# Patient Record
Sex: Female | Born: 1940
Health system: Southern US, Community
[De-identification: ages and names within clinical notes are randomized; demographics above are authoritative.]

## PROBLEM LIST (undated history)

## (undated) DIAGNOSIS — M549 Dorsalgia, unspecified: Secondary | ICD-10-CM

## (undated) DIAGNOSIS — D649 Anemia, unspecified: Secondary | ICD-10-CM

## (undated) DIAGNOSIS — I82409 Acute embolism and thrombosis of unspecified deep veins of unspecified lower extremity: Secondary | ICD-10-CM

## (undated) DIAGNOSIS — R51 Headache: Secondary | ICD-10-CM

## (undated) DIAGNOSIS — Z8711 Personal history of peptic ulcer disease: Secondary | ICD-10-CM

## (undated) DIAGNOSIS — Z8739 Personal history of other diseases of the musculoskeletal system and connective tissue: Secondary | ICD-10-CM

## (undated) DIAGNOSIS — M1990 Unspecified osteoarthritis, unspecified site: Secondary | ICD-10-CM

## (undated) DIAGNOSIS — K829 Disease of gallbladder, unspecified: Secondary | ICD-10-CM

## (undated) DIAGNOSIS — M25569 Pain in unspecified knee: Secondary | ICD-10-CM

## (undated) DIAGNOSIS — Z86718 Personal history of other venous thrombosis and embolism: Secondary | ICD-10-CM

## (undated) DIAGNOSIS — K5900 Constipation, unspecified: Secondary | ICD-10-CM

## (undated) DIAGNOSIS — E559 Vitamin D deficiency, unspecified: Secondary | ICD-10-CM

## (undated) DIAGNOSIS — M7989 Other specified soft tissue disorders: Secondary | ICD-10-CM

## (undated) HISTORY — DX: Personal history of peptic ulcer disease: Z87.11

## (undated) HISTORY — DX: Disease of gallbladder, unspecified: K82.9

## (undated) HISTORY — DX: Other specified soft tissue disorders: M79.89

## (undated) HISTORY — DX: Constipation, unspecified: K59.00

## (undated) HISTORY — DX: Personal history of other venous thrombosis and embolism: Z86.718

## (undated) HISTORY — DX: Vitamin D deficiency, unspecified: E55.9

## (undated) HISTORY — DX: Pain in unspecified knee: M25.569

## (undated) HISTORY — DX: Dorsalgia, unspecified: M54.9

## (undated) HISTORY — DX: Anemia, unspecified: D64.9

## (undated) HISTORY — DX: Unspecified osteoarthritis, unspecified site: M19.90

## (undated) HISTORY — DX: Personal history of other diseases of the musculoskeletal system and connective tissue: Z87.39

## (undated) HISTORY — DX: Acute embolism and thrombosis of unspecified deep veins of unspecified lower extremity: I82.409

---

## 1999-05-14 HISTORY — PX: BUNIONECTOMY: SHX129

## 1999-05-14 HISTORY — PX: HAMMER TOE SURGERY: SHX385

## 2003-11-15 ENCOUNTER — Emergency Department (HOSPITAL_COMMUNITY): Admission: EM | Admit: 2003-11-15 | Discharge: 2003-11-15 | Payer: Self-pay | Admitting: Emergency Medicine

## 2004-05-13 HISTORY — PX: OTHER SURGICAL HISTORY: SHX169

## 2005-02-27 ENCOUNTER — Encounter: Admission: RE | Admit: 2005-02-27 | Discharge: 2005-02-27 | Payer: Self-pay | Admitting: Specialist

## 2005-05-13 HISTORY — PX: CHOLECYSTECTOMY: SHX55

## 2005-05-13 HISTORY — PX: UMBILICAL HERNIA REPAIR: SHX196

## 2005-10-21 ENCOUNTER — Inpatient Hospital Stay (HOSPITAL_COMMUNITY): Admission: EM | Admit: 2005-10-21 | Discharge: 2005-10-23 | Payer: Self-pay | Admitting: Emergency Medicine

## 2005-10-21 ENCOUNTER — Emergency Department (HOSPITAL_COMMUNITY): Admission: EM | Admit: 2005-10-21 | Discharge: 2005-10-21 | Payer: Self-pay | Admitting: Emergency Medicine

## 2005-10-22 ENCOUNTER — Encounter (INDEPENDENT_AMBULATORY_CARE_PROVIDER_SITE_OTHER): Payer: Self-pay | Admitting: Specialist

## 2008-08-18 ENCOUNTER — Other Ambulatory Visit: Admission: RE | Admit: 2008-08-18 | Discharge: 2008-08-18 | Payer: Self-pay | Admitting: Family Medicine

## 2009-05-13 HISTORY — PX: EYE SURGERY: SHX253

## 2010-09-28 NOTE — Op Note (Signed)
[name], [name]             ACCOUNT NO.:  [id number]   MEDICAL RECORD NO.:  [id number]          PATIENT TYPE:  INP   LOCATION:  5739                         FACILITY:  [facility]   PHYSICIAN:  Anselm Pancoast. Weatherly, M.D.DATE OF BIRTH:  12-11-1940   DATE OF PROCEDURE:  10/22/2005  DATE OF DISCHARGE:                                 OPERATIVE REPORT   PREOPERATIVE DIAGNOSIS:  Lisa cholecystitis with passage of common duct  Owens.   POSTOPERATIVE DIAGNOSIS:  Lisa Owens.   OPERATION:  1.  Laparoscopic cholecystectomy with cholangiogram, with flushing of Owens      through the ampulla and one stone retrieval through the cystic duct.  2.  Repair of a small umbilical hernia.   HISTORY:  Lisa Owens is a 70 year old lady who has lived in [place] for  about 30 years, is followed by Dr. Theresia Lo, and I saw her in the office in  March 2007 when she had had episodes of repeat epigastric pain, had had  episodes of normal liver function studies.  These were always epigastric,  would usually last about an hour, and I reviewed her and thought that she  was definitely passing common duct Owens.  She had had a previous tummy  tuck by Dr. Dub Amis years ago, and she has lost about 100 pounds, and she  desired to basically postpone surgery because of social schedules that she  had an tentatively was all scheduled for surgery sometime in July.  Yesterday she had an episode of pain that lasted about 3 hours, was more  intense, and she went to the emergency room.  She was seen there by Dr.  Carolynne Edouard, who was on call, and because of the patient having previously been  scheduled and it was midnightish when he was evaluating her, recommended  that she come on over to [facility] and let me add her to the OR schedule for  today.  I saw her this morning and she said her pain, after the pain had  subsided after approximately 3 hours, had not recurred.  Her white count was  12,500.  The liver function studies were abnormal again with the total  bilirubin of 1.8, alkaline phosphatase of 204, SGOT and SGPT of about 400  and 500, respectively.  I think it would be best just go ahead and add her  to the OR schedule and proceed with a cholangiogram and whether she will  have common duct Owens remaining or not will be determined.  The patient is  in agreement with this, was given 3 g of Unasyn and has PAS stockings.   She was positioned on the OR table and induction of general anesthesia,  abdomen was prepped with Betadine surgical scrub and solution and draped in  a sterile manner.  She has a little hernia at the umbilicus where she has  had the previous tummy tuck, and I made the little incision just below this  so I could go down and identify the fascia and then carefully entered into  the peritoneum.  Basically a single traction suture was placed in  the fascia  with 0 Vicryl and the Hasson cannula introduced.  The gallbladder was  chronically distended but not acutely inflamed, and there were adhesions up  around it.  The upper 10 mm trocar was placed and the two lateral 5 mm  trocars were placed under direct vision.  Magnus Ivan assisted and we  grabbed the gallbladder, retracted it upward and outward and then kind of  stripped the Lisa adhesions carefully from it and then identified the  cystic duct and also the cystic artery.  The Owens were in the cystic duct.  These were milked back into the gallbladder, and then I placed a clip on the  cystic duct-gallbladder junction, opened the cystic duct.  Another stone  popped up from the more proximal cystic duct, I think, and then we kind of  milked it.  We got a lot of sludge and kind of soft stone type debris.  Next  I put a Agricultural consultant in the cystic duct, held it in place with a  clip, and then did a very careful cholangiogram with the diluted dye, and we  could see three Owens in the distal  common bile duct but with gentle  pressure, these popped on through the cystic duct.  She still has a  chronically dilated cystic duct but we let the dye to get flushed through  with the saline, gave her some glucagon, and then repeated the x-ray, and  this time besides being a dilated cystic duct, we see no other Owens in the  cystic duct.  I think that what I would do is not recommend that she proceed  on with an ERCP now but see how she is doing clinically symptomatically and  make sure her liver tests come back to normal, as this is the 10th or 15th  episode of pain that she has had over the last three years.  The cystic duct  was triply clipped and then divided.  The cystic artery had been dissected  out and doubly clipped proximally, singly distally, and it was divided, and  then the gallbladder was freed from its bed with hook electrocautery  carefully.  Good hemostasis was obtained.  The gallbladder was placed in the  EndoCatch bag and brought out through the umbilicus.  I then basically freed  up a little fatty tissue that was up under the umbilicus and closed the  fascia with interrupted sutures of 0 Prolene.  I used an Endoclose to go up  high and, hopefully, the little skin bridge at the umbilicus is still  viable, and then reinflated the carbon dioxide, and it appears that  everything as fascia is intact.  The little irrigating fluid had been  aspirated.  It appeared there had been a little bit of bleeding from where  the cholangiocatheter had been put through the anterior abdominal wall, and  this was irrigated.  I did inject it with Marcaine, and there is no active  bleeding at this time.  The irrigating fluid was removed, 5 mm ports were  withdrawn under direct vision and then, after the carbon dioxide released,  the upper 10 mm trocar withdrawn.  The subcutaneous wounds were closed with  4-0 Vicryl and Benzoin and Steri-Strips were placed on the skin.           ______________________________  Anselm Pancoast. Zachery Dakins, M.D.     WJW/MEDQ  D:  10/22/2005  T:  10/22/2005  Job:  [id number]   cc:  Vikki Ports, M.D.  Fax: [phone]

## 2010-09-28 NOTE — Consult Note (Signed)
Lisa Owens, Lisa Owens                         ACCOUNT NO.:  [id number]   MEDICAL RECORD NO.:  [id number]                   PATIENT TYPE:  EMS   LOCATION:  MAJO                                 FACILITY:  [facility]   PHYSICIAN:  Pramod P. Pearlean Brownie, MD                 DATE OF BIRTH:  08/25/1940   DATE OF CONSULTATION:  DATE OF DISCHARGE:                                   CONSULTATION   REFERRING PHYSICIAN:  Carren Rang, M.D.   REASON FOR REFERRAL:  Memory loss.   HISTORY OF PRESENT ILLNESS:  Lisa Owens is 70 year old lady who developed a  sudden onset of short term memory loss earlier today.  The patient just  finished testifying in court in favor of her son who is being sued for  stalking by her employee.  The patient apparently also had a flawless  testimony as per the husband, but subsequently she was found to be confused  and not able to remember any recent events including events from the last  few months.  The patient, in fact, did not even remember being in court when  she was evaluated in the emergency room upon her arrival.  Since then, she  has started improving and is now able to recall recent events including the  fact that she was in court, though she is still unable to recall details of  the questions she was asked.  She denies any headache, confusion, slurred  speech, gait or balance difficulties.  No prior history of stroke, TIAs,  significant neurological problems.   PAST MEDICAL HISTORY:  1. Hyperlipidemia.  2. History of shingles 2-3 weeks ago.   HOME MEDICATIONS:  None.   MEDICATION ALLERGIES:  None.   FAMILY HISTORY:  Not significant for anybody with neurological problems.   SOCIAL HISTORY:  She is married.  She is a housewife.  She lives with her  husband.  Does not smoke, and drinks alcohol only occasionally.   REVIEW OF SYSTEMS:  Not significant for chest pain, shortness of breath,  cough, diarrhea, __________ , blurred vision.   PHYSICAL EXAMINATION:   GENERAL:  Reveals a pleasant elderly lady who is not  in distress.  VITAL SIGNS:  She is afebrile.  Pulse rate is 85 per minute regular.  Respiratory rate 18 per minute.  Blood pressure 139/75.  Distal pulses well  felt.  HEAD:  Nontraumatic.  NECK:  Supple without bruit.  ENT:  Unremarkable.  CARDIAC:  No murmur, gallop.  LUNGS:  Clear to auscultation.  NEUROLOGIC:  The patient is awake, alert, oriented x 3 with normal speech  and language function.  There is no aphasia, apraxia, or dysarthria.  She  has diminished recall 0 out of 3.  She has intact attention, registration,  calculation, naming, comprehension, repetition, and good verbal fluency.  She can follow three-step commands.  Pupils are equal, reactive to light and  accommodation.  Visual acuity and fields are adequate.  Face is symmetric.  Palate elevates normally.  The tongue is midline.  Motor system exam reveals  symmetric upper and lower extremity strength, tone, reflexes, coordination,  and sensation.  She can walk with a steady gait.   DATA REVIEWED:  MRI scan of the brain has been arranged but the patient  refused it saying she can not go through the procedure.  The patient was  advised to take 2 mg of Ativan and go back in the MRI scan which she refused  that too.   IMPRESSION:  A 70 year old lady with an episode of transient new and short  term memory loss which seems to be improving.  This does sound significantly  like transient global amnesia.  Even the significant stress from the recent  court appearance may have contributed or even brought it on.  The patient is  refusing further testing and since she has significantly improved, she may  perhaps be discharged home and can be followed up as an outpatient in the  future if necessary.  I would recommend she start aspirin 325 mg a day for  stroke and cardiac prevention.  If her symptoms recur or she develops new  symptoms, she has promised to come back to the  emergency room.  I have  discussed this with the patient's husband and answered questions.                                               Pramod P. Pearlean Brownie, MD    PPS/MEDQ  D:  11/15/2003  T:  11/16/2003  Job:  [id number]

## 2010-09-28 NOTE — Discharge Summary (Signed)
[name], [name]             ACCOUNT NO.:  [id number]   MEDICAL RECORD NO.:  [id number]          PATIENT TYPE:  INP   LOCATION:  5739                         FACILITY:  [facility]   PHYSICIAN:  Anselm Pancoast. Zachery Dakins, M.D.DATE OF BIRTH:  November 16, 1940   DATE OF ADMISSION:  10/21/2005  DATE OF DISCHARGE:  10/23/2005                                 DISCHARGE SUMMARY   DISCHARGE DIAGNOSIS:  Chronic cholecystitis with recent passage of common  duct stones.   OPERATION:  Laparoscopic cholecystectomy with cholangiogram.   HISTORY:  Mrs. Lisa Owens is a 70 year old female who had been scheduled by my  office for elective cholecystectomy and cholangiogram.  I saw her back in  March, but she wanted to wait until the summer.  She has had episodes of  epigastric pain and, on October 21, 2005, she had a severe episode of pain and  presented to the emergency room.  She was seen by Dr. Carolynne Edouard, who was on call.  He evaluated her and recommended that she be admitted to [facility], where I was  the doctor of the week, so I could proceed with the cholecystectomy  promptly, instead of waiting for the time she was tentatively scheduled in  July.  The patient was in agreement with this and she was transferred over  that evening.  I saw her the following morning.  Her white count was 12,500.  Her liver function studies were initially elevated.  SGOT of 505, SGPT of  453, alkaline phosphatase 204, and a total bilirubin of 1.8.  Repeated the  studies and they were slightly better, this was 12 hours later, and we  proceeded onto surgery and did a cholangiogram.  The cholangiogram was  consistent with a kind-of dilated common biliary system, but flowing to the  duodenum and I think she passed a common duct stone.  The patient,  postoperatively, did satisfactorily and was released the following day with  instructions to return to our office in approximately 1 week and we would  repeat the laboratory tests at that time.   Patient has Tylox for incision pain.  Will follow a low-fat diet.  She is on  a weight reduction diet and we will continue on her chronic medications.           ______________________________  Anselm Pancoast. Zachery Dakins, M.D.     WJW/MEDQ  D:  12/21/2005  T:  12/21/2005  Job:  [id number]

## 2010-09-28 NOTE — H&P (Signed)
NAMEKALIA, VAHEY NO.:  [id number]   MEDICAL RECORD NO.:  [id number]          PATIENT TYPE:  EMS   LOCATION:  ED                           FACILITY:  [facility]   PHYSICIAN:  Ollen Gross. Vernell Morgans, M.D. DATE OF BIRTH:  09-20-1940   DATE OF ADMISSION:  10/21/2005  DATE OF DISCHARGE:                                HISTORY & PHYSICAL   Mr. Gustafson is a 70 year old white female who is a patient of Dr. Zachery Dakins.  She has seen him in the past for gallstones. He apparently has worked her up  and had her set up for cholecystectomy in July. Unfortunately, her pain  which she has been having pretty much every day has worsened to the point  where the pain is unbearable right now. She has not really had any nausea or  vomiting with this. No fever or chills. No chest pain or shortness of  breath. No diarrhea or dysuria. Her other review of systems are  unremarkable.   PAST MEDICAL HISTORY:  None.   PAST SURGICAL HISTORY:  Significant for a tummy tuck and bunion surgery.   MEDICATIONS:  None.   ALLERGIES:  None.   SOCIAL HISTORY:  She denies the use of alcohol or tobacco products.   FAMILY HISTORY:  Noncontributory.   PHYSICAL EXAMINATION:  GENERAL: She is a well-developed, well-nourished  white female in significant pain.  SKIN: Warm and dry. No jaundice.  HEENT:  Extraocular muscles are intact. Pupils equal, round, and reactive to  light. Sclerae are nonicteric.  LUNGS: Clear bilaterally with no use of accessory or respiratory muscles.  HEART: Regular rate and rhythm with an impulse in the left chest.  ABDOMEN: Soft, with severe epigastric right upper quadrant pain. No guarding  or peritonitis.  EXTREMITIES: No clubbing, cyanosis, or edema, with good strength in her arms  and legs.  PSYCHOLOGIC:  She is alert and oriented times three, with no known state of  anxiety or depression.   ASSESSMENT/PLAN:  This is a 70 year old white female with what sounds like  worsening  of her gallbladder pain. Dr. Zachery Dakins is the physician of the  week this week and since we have no beds at [facility] we will plan to  directly admit her to the physician of week, Dr. Zachery Dakins, over at [facility]. We  will work on her pain control tonight, check her liver function tests, CBC,  ultrasound, and set her up hopefully for gallbladder surgery in the next day  or so.      Ollen Gross. Vernell Morgans, M.D.  Electronically Signed     PST/MEDQ  D:  10/21/2005  T:  10/21/2005  Job:  [id number]

## 2011-05-17 ENCOUNTER — Other Ambulatory Visit: Payer: Self-pay | Admitting: Specialist

## 2011-05-17 DIAGNOSIS — M25511 Pain in right shoulder: Secondary | ICD-10-CM

## 2011-05-23 ENCOUNTER — Ambulatory Visit
Admission: RE | Admit: 2011-05-23 | Discharge: 2011-05-23 | Disposition: A | Discharge status: Home / Self Care | Payer: BC Managed Care – PPO | Source: Ambulatory Visit | Attending: Specialist | Admitting: Specialist

## 2011-05-23 DIAGNOSIS — M25511 Pain in right shoulder: Secondary | ICD-10-CM

## 2011-06-21 ENCOUNTER — Other Ambulatory Visit: Payer: Self-pay | Admitting: Specialist

## 2011-06-21 DIAGNOSIS — M25511 Pain in right shoulder: Secondary | ICD-10-CM

## 2011-07-01 ENCOUNTER — Ambulatory Visit
Admission: RE | Admit: 2011-07-01 | Discharge: 2011-07-01 | Disposition: A | Discharge status: Home / Self Care | Payer: Medicare Other | Source: Ambulatory Visit | Attending: Specialist | Admitting: Specialist

## 2011-07-01 DIAGNOSIS — M25511 Pain in right shoulder: Secondary | ICD-10-CM

## 2011-07-26 ENCOUNTER — Other Ambulatory Visit: Payer: Self-pay | Admitting: Otolaryngology

## 2011-08-28 ENCOUNTER — Encounter (HOSPITAL_COMMUNITY): Payer: Self-pay | Admitting: Pharmacy Technician

## 2011-09-16 ENCOUNTER — Encounter (HOSPITAL_COMMUNITY)
Admission: RE | Admit: 2011-09-16 | Discharge: 2011-09-16 | Disposition: A | Discharge status: Home / Self Care | Payer: Medicare Other | Source: Ambulatory Visit | Attending: Specialist | Admitting: Specialist

## 2011-09-16 ENCOUNTER — Encounter (HOSPITAL_COMMUNITY): Payer: Self-pay

## 2011-09-16 HISTORY — DX: Headache: R51

## 2011-09-16 LAB — CBC
HCT: 40.1 % (ref 36.0–46.0)
Hemoglobin: 13.7 g/dL (ref 12.0–15.0)
MCH: 30.4 pg (ref 26.0–34.0)
MCHC: 34.2 g/dL (ref 30.0–36.0)
MCV: 89.1 fL (ref 78.0–100.0)
Platelets: 240 10*3/uL (ref 150–400)
RBC: 4.5 MIL/uL (ref 3.87–5.11)
RDW: 13.3 % (ref 11.5–15.5)
WBC: 7.5 10*3/uL (ref 4.0–10.5)

## 2011-09-16 LAB — SURGICAL PCR SCREEN
MRSA, PCR: NEGATIVE
Staphylococcus aureus: POSITIVE — AB

## 2011-09-16 NOTE — Patient Instructions (Signed)
20 Lisa Owens  09/16/2011   Your procedure is scheduled on:  Thursday 09/19/2011 at 0730am  Report to [facility] at 0530 AM.  Call this number if you have problems the morning of surgery: [phone]   Remember:   Do not eat food:After Midnight.  May have clear liquids:until Midnight .  Marland Kitchen  Take these medicines the morning of surgery with A SIP OF WATER: NONE   Do not wear jewelry, make-up or nail polish.  Do not wear lotions, powders, or perfumes.   Do not shave 48 hours prior to surgery-women only -shaving legs  Do not bring valuables to the hospital.  Contacts, dentures or bridgework may not be worn into surgery.  Leave suitcase in the car. After surgery it may be brought to your room.  For patients admitted to the hospital, checkout time is 11:00 AM the day of discharge.       Special Instructions: CHG Shower Use Special Wash: 1/2 bottle night before surgery and 1/2 bottle morning of surgery.   Please read over the following fact sheets that you were given: MRSA Information, Incentive Spirometry, Sleep apnea sheet

## 2011-09-16 NOTE — Pre-Procedure Instructions (Signed)
Chest PA/LAT 07/15/2011 from [place] AT [facility], EKG 07/15/2011 Deboraha Sprang at [facility] on chart

## 2011-09-18 NOTE — Anesthesia Preprocedure Evaluation (Signed)

## 2011-09-19 ENCOUNTER — Encounter (HOSPITAL_COMMUNITY): Payer: Self-pay | Admitting: Anesthesiology

## 2011-09-19 ENCOUNTER — Ambulatory Visit (HOSPITAL_COMMUNITY): Payer: Medicare Other | Admitting: Anesthesiology

## 2011-09-19 ENCOUNTER — Observation Stay (HOSPITAL_COMMUNITY)
Admission: RE | Admit: 2011-09-19 | Discharge: 2011-09-20 | Disposition: A | Discharge status: Home / Self Care | Payer: Medicare Other | Source: Ambulatory Visit | Attending: Specialist | Admitting: Specialist

## 2011-09-19 ENCOUNTER — Encounter (HOSPITAL_COMMUNITY)
Admission: RE | Disposition: A | Discharge status: Home / Self Care | Payer: Self-pay | Source: Ambulatory Visit | Attending: Specialist

## 2011-09-19 ENCOUNTER — Encounter (HOSPITAL_COMMUNITY): Payer: Self-pay | Admitting: *Deleted

## 2011-09-19 DIAGNOSIS — X58XXXA Exposure to other specified factors, initial encounter: Secondary | ICD-10-CM | POA: Insufficient documentation

## 2011-09-19 DIAGNOSIS — M75100 Unspecified rotator cuff tear or rupture of unspecified shoulder, not specified as traumatic: Secondary | ICD-10-CM

## 2011-09-19 DIAGNOSIS — S43429A Sprain of unspecified rotator cuff capsule, initial encounter: Principal | ICD-10-CM | POA: Insufficient documentation

## 2011-09-19 DIAGNOSIS — Z01812 Encounter for preprocedural laboratory examination: Secondary | ICD-10-CM | POA: Insufficient documentation

## 2011-09-19 DIAGNOSIS — M25819 Other specified joint disorders, unspecified shoulder: Secondary | ICD-10-CM | POA: Insufficient documentation

## 2011-09-19 DIAGNOSIS — M898X9 Other specified disorders of bone, unspecified site: Secondary | ICD-10-CM | POA: Insufficient documentation

## 2011-09-19 HISTORY — PX: SHOULDER OPEN ROTATOR CUFF REPAIR: SHX2407

## 2011-09-19 HISTORY — PX: SUBACROMIAL DECOMPRESSION: SHX5174

## 2011-09-19 SURGERY — REPAIR, ROTATOR CUFF, OPEN
Anesthesia: General | Laterality: Right | Wound class: Clean

## 2011-09-19 MED ORDER — HYDROMORPHONE HCL PF 1 MG/ML IJ SOLN
INTRAMUSCULAR | Status: AC
  Filled 2011-09-19: qty 1

## 2011-09-19 MED ORDER — ACETAMINOPHEN 650 MG RE SUPP: 650.0000 mg | Freq: Four times a day (QID) | RECTAL | Status: DC | PRN

## 2011-09-19 MED ORDER — CEFAZOLIN SODIUM-DEXTROSE 2-3 GM-% IV SOLR
INTRAVENOUS | Status: AC
  Filled 2011-09-19: qty 50

## 2011-09-19 MED ORDER — EPHEDRINE SULFATE 50 MG/ML IJ SOLN
INTRAMUSCULAR | Status: DC | PRN
  Administered 2011-09-19: 15 mg via INTRAVENOUS

## 2011-09-19 MED ORDER — CISATRACURIUM BESYLATE (PF) 10 MG/5ML IV SOLN
INTRAVENOUS | Status: DC | PRN
  Administered 2011-09-19: 10 mg via INTRAVENOUS

## 2011-09-19 MED ORDER — KETOROLAC TROMETHAMINE 15 MG/ML IJ SOLN
15.0000 mg | Freq: Four times a day (QID) | INTRAMUSCULAR | Status: DC
  Administered 2011-09-19 – 2011-09-20 (×4): 15 mg via INTRAVENOUS
  Filled 2011-09-19 (×7): qty 1

## 2011-09-19 MED ORDER — SODIUM CHLORIDE 0.9 % IV SOLN
INTRAVENOUS | Status: DC
  Administered 2011-09-19: 75 mL/h via INTRAVENOUS
  Administered 2011-09-20: 02:00:00 via INTRAVENOUS

## 2011-09-19 MED ORDER — CEFAZOLIN SODIUM-DEXTROSE 2-3 GM-% IV SOLR
2.0000 g | INTRAVENOUS | Status: AC
  Administered 2011-09-19: 2 g via INTRAVENOUS

## 2011-09-19 MED ORDER — ONDANSETRON HCL 4 MG PO TABS: 4.0000 mg | ORAL_TABLET | Freq: Four times a day (QID) | ORAL | Status: DC | PRN

## 2011-09-19 MED ORDER — HYDROCODONE-ACETAMINOPHEN 5-325 MG PO TABS
1.0000 | ORAL_TABLET | ORAL | Status: DC | PRN
  Administered 2011-09-20: 1 via ORAL
  Filled 2011-09-19: qty 1

## 2011-09-19 MED ORDER — ACETAMINOPHEN 10 MG/ML IV SOLN
INTRAVENOUS | Status: DC | PRN
  Administered 2011-09-19: 1000 mg via INTRAVENOUS

## 2011-09-19 MED ORDER — ACETAMINOPHEN 325 MG PO TABS: 650.0000 mg | ORAL_TABLET | Freq: Four times a day (QID) | ORAL | Status: DC | PRN

## 2011-09-19 MED ORDER — BUPIVACAINE-EPINEPHRINE 0.5% -1:200000 IJ SOLN
INTRAMUSCULAR | Status: DC | PRN
  Administered 2011-09-19: 5 mL

## 2011-09-19 MED ORDER — DIPHENHYDRAMINE HCL 12.5 MG/5ML PO ELIX: 12.5000 mg | ORAL_SOLUTION | ORAL | Status: DC | PRN

## 2011-09-19 MED ORDER — ACETAMINOPHEN 10 MG/ML IV SOLN
INTRAVENOUS | Status: AC
  Filled 2011-09-19: qty 100

## 2011-09-19 MED ORDER — MENTHOL 3 MG MT LOZG: 1.0000 | LOZENGE | OROMUCOSAL | Status: DC | PRN

## 2011-09-19 MED ORDER — SODIUM CHLORIDE 0.9 % IR SOLN
Status: DC | PRN
  Administered 2011-09-19: 08:00:00

## 2011-09-19 MED ORDER — FENTANYL CITRATE 0.05 MG/ML IJ SOLN
INTRAMUSCULAR | Status: DC | PRN
  Administered 2011-09-19: 100 ug via INTRAVENOUS
  Administered 2011-09-19: 50 ug via INTRAVENOUS

## 2011-09-19 MED ORDER — DOCUSATE SODIUM 100 MG PO CAPS: 100.0000 mg | ORAL_CAPSULE | Freq: Two times a day (BID) | ORAL | Status: DC

## 2011-09-19 MED ORDER — PHENOL 1.4 % MT LIQD: 1.0000 | OROMUCOSAL | Status: DC | PRN

## 2011-09-19 MED ORDER — ETOMIDATE 2 MG/ML IV SOLN
INTRAVENOUS | Status: DC | PRN
  Administered 2011-09-19: 10 mg via INTRAVENOUS

## 2011-09-19 MED ORDER — METHOCARBAMOL 500 MG PO TABS
500.0000 mg | ORAL_TABLET | Freq: Four times a day (QID) | ORAL | Status: DC | PRN
  Administered 2011-09-20: 500 mg via ORAL
  Filled 2011-09-19: qty 1

## 2011-09-19 MED ORDER — METOCLOPRAMIDE HCL 5 MG PO TABS
5.0000 mg | ORAL_TABLET | Freq: Three times a day (TID) | ORAL | Status: DC | PRN
  Filled 2011-09-19: qty 2

## 2011-09-19 MED ORDER — BUPIVACAINE-EPINEPHRINE (PF) 0.5% -1:200000 IJ SOLN
INTRAMUSCULAR | Status: AC
  Filled 2011-09-19: qty 10

## 2011-09-19 MED ORDER — HYDROMORPHONE HCL PF 1 MG/ML IJ SOLN
0.2500 mg | INTRAMUSCULAR | Status: DC | PRN
  Administered 2011-09-19 (×4): 0.5 mg via INTRAVENOUS

## 2011-09-19 MED ORDER — CEFAZOLIN SODIUM-DEXTROSE 2-3 GM-% IV SOLR
2.0000 g | Freq: Four times a day (QID) | INTRAVENOUS | Status: AC
  Administered 2011-09-19 – 2011-09-20 (×3): 2 g via INTRAVENOUS
  Filled 2011-09-19 (×3): qty 50

## 2011-09-19 MED ORDER — METOCLOPRAMIDE HCL 5 MG/ML IJ SOLN: 5.0000 mg | Freq: Three times a day (TID) | INTRAMUSCULAR | Status: DC | PRN

## 2011-09-19 MED ORDER — HYDROMORPHONE HCL PF 1 MG/ML IJ SOLN: 0.5000 mg | INTRAMUSCULAR | Status: DC | PRN

## 2011-09-19 MED ORDER — LACTATED RINGERS IV SOLN: INTRAVENOUS | Status: DC

## 2011-09-19 MED ORDER — LACTATED RINGERS IV SOLN
INTRAVENOUS | Status: DC
  Administered 2011-09-19 (×2): via INTRAVENOUS

## 2011-09-19 MED ORDER — ONDANSETRON HCL 4 MG/2ML IJ SOLN
4.0000 mg | Freq: Four times a day (QID) | INTRAMUSCULAR | Status: DC | PRN
  Administered 2011-09-19: 4 mg via INTRAVENOUS
  Filled 2011-09-19: qty 2

## 2011-09-19 MED ORDER — PROPOFOL 10 MG/ML IV EMUL
INTRAVENOUS | Status: DC | PRN
  Administered 2011-09-19: 100 mg via INTRAVENOUS

## 2011-09-19 MED ORDER — HYDROMORPHONE HCL PF 1 MG/ML IJ SOLN
0.5000 mg | INTRAMUSCULAR | Status: DC | PRN
  Administered 2011-09-19: 0.5 mg via INTRAVENOUS

## 2011-09-19 MED ORDER — DEXTROSE 5 % IV SOLN
500.0000 mg | Freq: Four times a day (QID) | INTRAVENOUS | Status: DC | PRN
  Administered 2011-09-19: 500 mg via INTRAVENOUS
  Filled 2011-09-19: qty 5

## 2011-09-19 SURGICAL SUPPLY — 50 items
ANCHOR ROTATOR CUFF #2 (Anchor) ×4 IMPLANT
BAG ZIPLOCK 12X15 (MISCELLANEOUS) ×2 IMPLANT
BENZOIN TINCTURE PRP APPL 2/3 (GAUZE/BANDAGES/DRESSINGS) ×2 IMPLANT
BLADE OSCILLATING/SAGITTAL (BLADE) ×1
BLADE SW THK.38XMED LNG THN (BLADE) ×1 IMPLANT
BNDG COHESIVE 4X5 WHT NS (GAUZE/BANDAGES/DRESSINGS) ×2 IMPLANT
BUR OVAL CARBIDE 4.0 (BURR) ×2 IMPLANT
CHLORAPREP W/TINT 26ML (MISCELLANEOUS) IMPLANT
CLEANER TIP ELECTROSURG 2X2 (MISCELLANEOUS) ×2 IMPLANT
CLOTH BEACON ORANGE TIMEOUT ST (SAFETY) ×2 IMPLANT
CLSR STERI-STRIP ANTIMIC 1/2X4 (GAUZE/BANDAGES/DRESSINGS) ×2 IMPLANT
DECANTER SPIKE VIAL GLASS SM (MISCELLANEOUS) ×2 IMPLANT
DRAPE ORTHO SPLIT 77X108 STRL (DRAPES) ×1
DRAPE POUCH INSTRU U-SHP 10X18 (DRAPES) ×2 IMPLANT
DRAPE SURG ORHT 6 SPLT 77X108 (DRAPES) ×1 IMPLANT
DRSG EMULSION OIL 3X3 NADH (GAUZE/BANDAGES/DRESSINGS) ×2 IMPLANT
DRSG TELFA 4X14 ISLAND ADH (GAUZE/BANDAGES/DRESSINGS) ×2 IMPLANT
DURAPREP 26ML APPLICATOR (WOUND CARE) ×2 IMPLANT
ELECT NEEDLE TIP 2.8 STRL (NEEDLE) ×2 IMPLANT
ELECT REM PT RETURN 9FT ADLT (ELECTROSURGICAL) ×2
ELECTRODE REM PT RTRN 9FT ADLT (ELECTROSURGICAL) ×1 IMPLANT
GLOVE BIOGEL PI IND STRL 8 (GLOVE) ×1 IMPLANT
GLOVE BIOGEL PI INDICATOR 8 (GLOVE) ×1
GLOVE ECLIPSE 6.5 STRL STRAW (GLOVE) ×2 IMPLANT
GLOVE INDICATOR 6.5 STRL GRN (GLOVE) ×2 IMPLANT
GLOVE SURG SS PI 7.5 STRL IVOR (GLOVE) ×8 IMPLANT
GLOVE SURG SS PI 8.0 STRL IVOR (GLOVE) ×4 IMPLANT
GOWN PREVENTION PLUS LG XLONG (DISPOSABLE) ×4 IMPLANT
GOWN STRL REIN XL XLG (GOWN DISPOSABLE) ×2 IMPLANT
KIT BASIN OR (CUSTOM PROCEDURE TRAY) ×2 IMPLANT
MANIFOLD NEPTUNE II (INSTRUMENTS) ×2 IMPLANT
NEEDLE MA TROC 1/2 (NEEDLE) ×2 IMPLANT
NEEDLE MA TROC 1/2 CIR (NEEDLE) IMPLANT
PACK SHOULDER CUSTOM OPM052 (CUSTOM PROCEDURE TRAY) ×2 IMPLANT
POSITIONER SURGICAL ARM (MISCELLANEOUS) ×2 IMPLANT
PUSHLOCK PEEK 4.5X24 (Orthopedic Implant) ×4 IMPLANT
SLING ARM IMMOBILIZER LRG (SOFTGOODS) ×2 IMPLANT
SLING ULTRA II S (ORTHOPEDIC SUPPLIES) ×2 IMPLANT
SPONGE LAP 4X18 X RAY DECT (DISPOSABLE) ×2 IMPLANT
STRIP CLOSURE SKIN 1/2X4 (GAUZE/BANDAGES/DRESSINGS) ×2 IMPLANT
SUT BONE WAX W31G (SUTURE) ×2 IMPLANT
SUT ETHIBOND 0 (SUTURE) IMPLANT
SUT ETHIBOND 2 OS 4 DA (SUTURE) ×4 IMPLANT
SUT PROLENE 3 0 PS 2 (SUTURE) ×2 IMPLANT
SUT VIC AB 1 CT1 27 (SUTURE) ×2
SUT VIC AB 1 CT1 27XBRD ANTBC (SUTURE) ×2 IMPLANT
SUT VIC AB 1-0 CT2 27 (SUTURE) ×2 IMPLANT
SUT VIC AB 2-0 CT2 27 (SUTURE) ×2 IMPLANT
SUT VICRYL 0 UR6 27IN ABS (SUTURE) IMPLANT
SUT VICRYL 0-0 OS 2 NEEDLE (SUTURE) ×4 IMPLANT

## 2011-09-19 NOTE — Transfer of Care (Signed)
Immediate Anesthesia Transfer of Care Note  Patient: Lisa Owens  Procedure(s) Performed: Procedure(s) (LRB): ROTATOR CUFF REPAIR SHOULDER OPEN (Right) SUBACROMIAL DECOMPRESSION (Right)  Patient Location: PACU  Anesthesia Type: General  Level of Consciousness: awake, sedated and patient cooperative  Airway & Oxygen Therapy: Patient Spontanous Breathing and Patient connected to face mask oxygen  Post-op Assessment: Report given to PACU RN and Post -op Vital signs reviewed and stable  Post vital signs: Reviewed and stable  Complications: No apparent anesthesia complications

## 2011-09-19 NOTE — Anesthesia Postprocedure Evaluation (Signed)
  Anesthesia Post-op Note  Patient: Lisa Owens  Procedure(s) Performed: Procedure(s) (LRB): ROTATOR CUFF REPAIR SHOULDER OPEN (Right) SUBACROMIAL DECOMPRESSION (Right)  Patient Location: PACU  Anesthesia Type: General  Level of Consciousness: awake and alert   Airway and Oxygen Therapy: Patient Spontanous Breathing  Post-op Pain: mild  Post-op Assessment: Post-op Vital signs reviewed, Patient's Cardiovascular Status Stable, Respiratory Function Stable, Patent Airway and No signs of Nausea or vomiting  Post-op Vital Signs: stable  Complications: No apparent anesthesia complications

## 2011-09-19 NOTE — Brief Op Note (Signed)
09/19/2011  8:52 AM  PATIENT:  Lisa Owens  71 y.o. female  PRE-OPERATIVE DIAGNOSIS:  Right Rotator Cuff Tear  POST-OPERATIVE DIAGNOSIS:  Right Rotator Cuff Tear  PROCEDURE:  Procedure(s) (LRB): ROTATOR CUFF REPAIR SHOULDER OPEN (Right) SUBACROMIAL DECOMPRESSION (Right)  SURGEON:  Surgeon(s) and Role:    * Javier Docker, MD - Primary  PHYSICIAN ASSISTANT:   ASSISTANTS: strader   ANESTHESIA:   general  EBL:  Total I/O In: 1000 [I.V.:1000] Out: -   BLOOD ADMINISTERED:none  DRAINS: none   LOCAL MEDICATIONS USED:  MARCAINE     SPECIMEN:  No Specimen  DISPOSITION OF SPECIMEN:  N/A  COUNTS:  YES  TOURNIQUET:  * No tourniquets in log *  DICTATION: .Other Dictation: Dictation Number [id number]  PLAN OF CARE: Admit for overnight observation  PATIENT DISPOSITION:  PACU - hemodynamically stable.   Delay start of Pharmacological VTE agent (>24hrs) due to surgical blood loss or risk of bleeding: no

## 2011-09-19 NOTE — H&P (Signed)
Lisa Owens is an 71 y.o. female.   Chief Complaint: right shoulder pain HPI: Right RCR  Past Medical History  Diagnosis Date  . Headache     sinus    Past Surgical History  Procedure Date  . Cholecystectomy 2007  . Tummy tuck 2006  . Hammer toe surgery 2001  . Bunionectomy 2001    along with hammer toe  . Umbilical hernia repair 2007    along with gallbladder  . Eye surgery 2011    bilateral cataract    History reviewed. No pertinent family history. Social History:  does not have a smoking history on file. She does not have any smokeless tobacco history on file. She reports that she drinks about .6 ounces of alcohol per week. She reports that she does not use illicit drugs.  Allergies: No Known Allergies  No prescriptions prior to admission    No results found for this or any previous visit (from the past 48 hour(s)). No results found.  Review of Systems  Musculoskeletal: Positive for joint pain.  All other systems reviewed and are negative.    Blood pressure 136/86, pulse 95, temperature 97.8 F (36.6 C), resp. rate 20, SpO2 97.00%. Physical Exam  Vitals reviewed. Constitutional: She appears well-developed.  HENT:  Head: Normocephalic.  Eyes: Pupils are equal, round, and reactive to light.  Neck: Normal range of motion.  Cardiovascular: Normal rate.   Respiratory: Effort normal. No respiratory distress.  GI: Soft.  Musculoskeletal:       Decreased ROM left shoulder. Pain with ROM  Neurological: She is alert.  Skin: Skin is warm.  Psychiatric: She has a normal mood and affect.   Right shoulder pain with Decreased ROM.  Assessment/Plan Right RCT. Plan RCR. Risks discussed.  Ezekeil Bethel C 09/19/2011, 7:24 AM

## 2011-09-19 NOTE — Addendum Note (Signed)
Addendum  created 09/19/11 1008 by Gaetano Hawthorne, MD   Modules edited:Orders

## 2011-09-19 NOTE — Progress Notes (Signed)
Dr. Shelle Iron in - made aware of patient's complaints of right shoulder and right wrist pain

## 2011-09-20 ENCOUNTER — Encounter (HOSPITAL_COMMUNITY): Payer: Self-pay | Admitting: Specialist

## 2011-09-20 MED ORDER — HYDROCODONE-ACETAMINOPHEN 5-325 MG PO TABS: 1.0000 | ORAL_TABLET | ORAL | Status: AC | PRN

## 2011-09-20 MED ORDER — METHOCARBAMOL 500 MG PO TABS: 500.0000 mg | ORAL_TABLET | Freq: Four times a day (QID) | ORAL | Status: AC | PRN

## 2011-09-20 NOTE — Progress Notes (Signed)
OT Note Eval completed and filed in shadow chart. All education completed. Pt will have prn A at home from husband. Progress rehab of the shoulder as ordered by MD post f/u visit. Will sign off.  Garrel Ridgel, OTR/L  Pager [phone] 09/20/2011

## 2011-09-20 NOTE — Op Note (Signed)
NAMEWAJIHA, VERSTEEG NO.:  [id number]  MEDICAL RECORD NO.:  [id number]  LOCATION:  1609                         FACILITY:  [facility]  PHYSICIAN:  Jene Every, M.D.    DATE OF BIRTH:  [birth year]  DATE OF PROCEDURE:  09/19/2011 DATE OF DISCHARGE:                              OPERATIVE REPORT   PREOPERATIVE DIAGNOSES: 1. Rotator cuff tear, right shoulder. 2. Impingement syndrome. 3. Osteophyte of the humeral head.  POSTOPERATIVE DIAGNOSES: 1. Rotator cuff tear, right shoulder. 2. Impingement syndrome. 3. Osteophyte of the humeral head.  PROCEDURE PERFORMED: 1. Mini open rotator cuff repair, massive, repairing the supraspinatus     infraspinatus portion of the subscap. 2. Acromioplasty. 3. Bursectomy. 4. Removal of osteophyte of the greater tuberosity of proximal     humerus.  ANESTHESIA:  General.  ASSISTANT:  Roma Schanz, P.A.  HISTORY:  This is a 71 year old who has had a rotator cuff tear for a protracted period of time.  She elected to delay her reconstruction for personal reasons.  An arthrogram confirmed the tear.  She is indicated for repair with risks and benefits including bleeding, infection, inability to repair suboptimal range of motion, delayed and prolonged rehabilitation, possible hemiarthroplasty, DVT, PE, anesthetic complications etc.  TECHNIQUE:  With the patient in a supine beachchair position after induction of adequate general anesthesia, 1 g of Kefzol, the right shoulder and upper extremity was prepped and draped in the usual sterile fashion.  Exam under anesthesia revealed full range.  A surgical marker was utilized to delineate the acromion, AC joint and coracoid. A 2 cm anterolateral incision was made over the anterolateral aspect of the acromion.  Subcutaneous tissue was dissected, electrocautery utilized to achieve hemostasis.  Marcaine with epinephrine was infiltrated in the subcutaneous tissues of the deltoid. I  identified the raphe between the anterolateral heads, divided that in line with the skin incision meticulously and subperiosteally elevated it from the anterolateral and anteromedial aspect of the acromion without preserving the attachment of the deltoid, detaching and resecting the CA ligament.  A spur of the anterolateral aspect of the acromion was removed with a 3 mm Kerrison.  A large retracted tear of the rotator cuff was noted with a distended neck and portion of the humeral head exposed.  Large osteophyte projecting off anteriorly and laterally off the anterior and proximal aspect of the humerus, this was removal a Leksell rongeur.  We then proceeded with mobilizing the cuff, initially it appeared to be a nonrepairable tear.  We identified the infraspinatus still attached, this was the majority of the subscap of the biceps and the bicipital groove was exposed as well as the superior aspect of the humeral head.  We mobilized the infraspinatus, it was attenuated tendon. The supraspinatus subscap tear was noted.  This tear extended more along the supraspinatus infraspinatus intersection completely back to the glenoid.  After multiple configurations 2 Mitek suture anchors were placed just lateral to the articular surface after removal of half a cm of the articular surface with a Beyer rongeur preparing a cancellous bed.  Then used 2 Mitek suture anchors, one to allow the anterior lateral advancement of the infraspinatus posteriorly and posterior lateral  advancement of the supraspinatus subscap portion.  Both were advanced to cover the footprint, threaded the 2 Mitek suture anchors. Good resistance to pullout.  I then threaded these into the 2 leaflets, tied them with a good surgical knot as we nearly opposed the 2 leaflets. After a good surgical knot was applied we then crossing them over the greater tuberosity and augmented the fixation in double row fashion with 2 push locks after  utilization of the awl and insertion of a push lock and the 2nd row fixation, which certainly improved and covered the bicipital groove to near the posterior leaflet.  We then repaired the remaining aperture with a number 1 interrupted Ethibond suture side-to- side after debriding the edges.  We essentially had full coverage lateral to the articular surface, but again the infraspinatus region was attenuated.  We had mobilized both leaflets of the cuff at the glenoid. And digitally lysed adhesions and excised the bursa.  The wound was copiously irrigated.  We ranged it, the fixation was intact.  We then repaired the raphe with a #1 Vicryl interrupted figure-of-eight suture, subcu with 2-0 Vicryl simple sutures.  The skin was reapproximated with subcuticular Prolene.  A sterile dressing was applied.  We placed an abduction pillow, extubated without difficulty, and transported her to the recovery room in satisfactory condition.  The patient tolerated the procedure well.  No complications.     Jene Every, M.D.     Cordelia Pen  D:  09/19/2011  T:  09/20/2011  Job:  [id number]

## 2011-09-20 NOTE — Discharge Summary (Signed)
Patient ID: Hiya Point MRN: [id number] DOB/AGE: 06/12/1940 71 y.o.  Admit date: 09/19/2011 Discharge date: 09/20/2011  Admission Diagnoses:  Rotator Cuff Tear Past Medical History  Diagnosis Date  . Headache     sinus   Discharge Diagnoses:  Same s/p right RCR   Surgeries: Procedure(s): ROTATOR CUFF REPAIR SHOULDER OPEN SUBACROMIAL DECOMPRESSION on 09/19/2011   Consultants:    Discharged Condition: Improved  Hospital Course: Lisa Owens is an 71 y.o. female who was admitted 09/19/2011 for operative treatment of rotator cuff tear. Patient has severe unremitting pain that affects sleep, daily activities, and work/hobbies. After pre-op clearance the patient was taken to the operating room on 09/19/2011 and underwent  Procedure(s): ROTATOR CUFF REPAIR SHOULDER OPEN SUBACROMIAL DECOMPRESSION.    Patient was given perioperative antibiotics: Anti-infectives     Start     Dose/Rate Route Frequency Ordered Stop   09/19/11 1400   ceFAZolin (ANCEF) IVPB 2 g/50 mL premix        2 g 100 mL/hr over 30 Minutes Intravenous Every 6 hours 09/19/11 1116 09/20/11 0202   09/19/11 0803   polymyxin B 500,000 Units, bacitracin 50,000 Units in sodium chloride irrigation 0.9 % 500 mL irrigation  Status:  Discontinued          As needed 09/19/11 0803 09/19/11 0905   09/19/11 0536   ceFAZolin (ANCEF) IVPB 2 g/50 mL premix        2 g 100 mL/hr over 30 Minutes Intravenous 60 min pre-op 09/19/11 0536 09/19/11 0759           Patient was given sequential compression devices, early ambulation, and chemoprophylaxis to prevent DVT.  Patient benefited maximally from hospital stay and there were no complications.    Recent vital signs: Patient Vitals for the past 24 hrs:  BP Temp Temp src Pulse Resp SpO2 Height Weight  09/20/11 0550 154/89 mmHg 98.8 F (37.1 C) Oral 87  20  96 % - -  09/20/11 0130 169/87 mmHg 98.8 F (37.1 C) Oral 78  16  96 % - -  09/19/11 2220 151/86 mmHg 98.7 F (37.1 C) Oral  78  16  100 % - -  09/19/11 1741 151/90 mmHg 98 F (36.7 C) - 78  16  94 % - -  09/19/11 1410 143/82 mmHg 97.6 F (36.4 C) - 74  16  96 % - -  09/19/11 1310 142/81 mmHg 97 F (36.1 C) - 76  16  98 % - -  09/19/11 1210 137/83 mmHg 97.5 F (36.4 C) - 77  16  96 % - -  09/19/11 1111 133/78 mmHg 97.7 F (36.5 C) Oral 87  15  96 % 5\' 5"  (1.651 m) 83.915 kg (185 lb)  09/19/11 1030 144/72 mmHg 97.7 F (36.5 C) - 84  18  99 % - -  09/19/11 1015 150/80 mmHg - - 87  19  99 % - -  09/19/11 1011 - - - 79  17  100 % - -  09/19/11 1000 154/69 mmHg 97.6 F (36.4 C) - 75  16  100 % - -  09/19/11 0957 - - - 76  17  100 % - -  09/19/11 0950 - - - 76  15  100 % - -  09/19/11 0945 144/71 mmHg - - 73  18  100 % - -  09/19/11 0933 - - - 77  15  100 % - -  09/19/11 0930 141/67 mmHg - - 77  16  100 % - -  09/19/11 0924 - - - 82  21  100 % - -  09/19/11 0915 145/72 mmHg - - 86  20  100 % - -  09/19/11 0908 148/75 mmHg 97.7 F (36.5 C) - 90  18  100 % - -     Recent laboratory studies: No results found for this basename: WBC:2,HGB:2,HCT:2,PLT:2,NA:2,K:2,CL:2,CO2:2,BUN:2,CREATININE:2,GLUCOSE:2,PT:2,INR:2,CALCIUM,2: in the last 72 hours   Discharge Medications:   Medication List  As of 09/20/2011  7:57 AM   TAKE these medications         HYDROcodone-acetaminophen 5-325 MG per tablet   Commonly known as: NORCO   Take 1-2 tablets by mouth every 4 (four) hours as needed.      methocarbamol 500 MG tablet   Commonly known as: ROBAXIN   Take 1 tablet (500 mg total) by mouth every 6 (six) hours as needed.            Diagnostic Studies: No results found.  Disposition: HOME  Discharge Orders    Future Orders Please Complete By Expires   Diet - low sodium heart healthy      Call MD / Call 911      Comments:   If you experience chest pain or shortness of breath, CALL 911 and be transported to the hospital emergency room.  If you develope a fever above 101 F, pus (white drainage) or increased  drainage or redness at the wound, or calf pain, call your surgeon's office.   Constipation Prevention      Comments:   Drink plenty of fluids.  Prune juice may be helpful.  You may use a stool softener, such as Colace (over the counter) 100 mg twice a day.  Use MiraLax (over the counter) for constipation as needed.   Increase activity slowly as tolerated      Weight Bearing as taught in Physical Therapy      Comments:   Use a walker or crutches as instructed.   Discharge instructions      Comments:   Use sling at all times except when exercising OK to move hand, wrist, and elbow Keep arm elevated on pillow when out of sling Change dressing daily Ok to shower in 72 hours No lifting No driving 2-4 weeks Pendulum exercises as reviewed with PT   Driving restrictions      Comments:   No driving for 4-6 weeks      Follow-up Information    Follow up with BEANE,JEFFREY C, MD in 14 days.   Contact information:   [facility] [address] [place] [address] [phone]           Signed: Liam Graham. 09/20/2011, 7:57 AM

## 2011-09-20 NOTE — Progress Notes (Signed)
Patient OOB to reclining chair at this time, with husband present at bedside. Discharge teaching and instructions given with teach back. Explained and demonstrated dressing change to husband and patient. She will discharge home today.

## 2011-09-20 NOTE — Care Management Note (Signed)
    Page 1 of 2   09/20/2011     10:44:43 AM   CARE MANAGEMENT NOTE 09/20/2011  Patient:  Mcneish,Yolinda   Account Number:  [id number]  Date Initiated:  09/20/2011  Documentation initiated by:  Colleen Can  Subjective/Objective Assessment:   dx rotatoro cuff tear; repair of rotator cuff     Action/Plan:   CM spoke with patient and spouse. Plans are for patient to return to her home where spouse will be caregiver.  Pt has ice pack to shoulder and sling to rt arm. There are no HH orders or needs  Pt has ben seen by occupational therapist   Anticipated DC Date:  09/20/2011   Anticipated DC Plan:  HOME/SELF CARE  In-house referral  NA      DC Planning Services  CM consult      PAC Choice  NA   Choice offered to / List presented to:  C-1 Patient   DME arranged  NA      DME agency  NA     HH arranged  NA      HH agency  NA   Status of service:  Completed, signed off Medicare Important Message given?  NA - LOS <3 / Initial given by admissions (If response is "NO", the following Medicare IM given date fields will be blank) Date Medicare IM given:   Date Additional Medicare IM given:    Discharge Disposition:    Per UR Regulation:  Reviewed for med. necessity/level of care/duration of stay  If discussed at Long Length of Stay Meetings, dates discussed:    Comments:  Pt will discharge today.

## 2011-09-20 NOTE — Discharge Instructions (Signed)
Impingement Syndrome, Rotator Cuff, Bursitis with Rehab Impingement syndrome is a condition that involves inflammation of the tendons of the rotator cuff and the subacromial bursa, that causes pain in the shoulder. The rotator cuff consists of four tendons and muscles that control much of the shoulder and upper arm function. The subacromial bursa is a fluid filled sac that helps reduce friction between the rotator cuff and one of the bones of the shoulder (acromion). Impingement syndrome is usually an overuse injury that causes swelling of the bursa (bursitis), swelling of the tendon (tendonitis), and/or a tear of the tendon (strain). Strains are classified into three categories. Grade 1 strains cause pain, but the tendon is not lengthened. Grade 2 strains include a lengthened ligament, due to the ligament being stretched or partially ruptured. With grade 2 strains there is still function, although the function may be decreased. Grade 3 strains include a complete tear of the tendon or muscle, and function is usually impaired. SYMPTOMS   Pain around the shoulder, often at the outer portion of the upper arm.   Pain that gets worse with shoulder function, especially when reaching overhead or lifting.   Sometimes, aching when not using the arm.   Pain that wakes you up at night.   Sometimes, tenderness, swelling, warmth, or redness over the affected area.   Loss of strength.   Limited motion of the shoulder, especially reaching behind the back (to the back pocket or to unhook bra) or across your body.   Crackling sound (crepitation) when moving the arm.   Biceps tendon pain and inflammation (in the front of the shoulder). Worse when bending the elbow or lifting.  CAUSES  Impingement syndrome is often an overuse injury, in which chronic (repetitive) motions cause the tendons or bursa to become inflamed. A strain occurs when a force is paced on the tendon or muscle that is greater than it can  withstand. Common mechanisms of injury include: Stress from sudden increase in duration, frequency, or intensity of training.  Direct hit (trauma) to the shoulder.   Aging, erosion of the tendon with normal use.   Bony bump on shoulder (acromial spur).  RISK INCREASES WITH:  Contact sports (football, wrestling, boxing).   Throwing sports (baseball, tennis, volleyball).   Weightlifting and bodybuilding.   Heavy labor.   Previous injury to the rotator cuff, including impingement.   Poor shoulder strength and flexibility.   Failure to warm up properly before activity.   Inadequate protective equipment.   Old age.   Bony bump on shoulder (acromial spur).  PREVENTION   Warm up and stretch properly before activity.   Allow for adequate recovery between workouts.   Maintain physical fitness:   Strength, flexibility, and endurance.   Cardiovascular fitness.   Learn and use proper exercise technique.  PROGNOSIS  If treated properly, impingement syndrome usually goes away within 6 weeks. Sometimes surgery is required.  RELATED COMPLICATIONS   Longer healing time if not properly treated, or if not given enough time to heal.   Recurring symptoms, that result in a chronic condition.   Shoulder stiffness, frozen shoulder, or loss of motion.   Rotator cuff tendon tear.   Recurring symptoms, especially if activity is resumed too soon, with overuse, with a direct blow, or when using poor technique.  TREATMENT  Treatment first involves the use of ice and medicine, to reduce pain and inflammation. The use of strengthening and stretching exercises may help reduce pain with activity. These exercises may   be performed at home or with a therapist. If non-surgical treatment is unsuccessful after more than 6 months, surgery may be advised. After surgery and rehabilitation, activity is usually possible in 3 months.  MEDICATION  If pain medicine is needed, nonsteroidal  anti-inflammatory medicines (aspirin and ibuprofen), or other minor pain relievers (acetaminophen), are often advised.   Do not take pain medicine for 7 days before surgery.   Prescription pain relievers may be given, if your caregiver thinks they are needed. Use only as directed and only as much as you need.   Corticosteroid injections may be given by your caregiver. These injections should be reserved for the most serious cases, because they may only be given a certain number of times.  HEAT AND COLD  Cold treatment (icing) should be applied for 10 to 15 minutes every 2 to 3 hours for inflammation and pain, and immediately after activity that aggravates your symptoms. Use ice packs or an ice massage.   Heat treatment may be used before performing stretching and strengthening activities prescribed by your caregiver, physical therapist, or athletic trainer. Use a heat pack or a warm water soak.  SEEK MEDICAL CARE IF:   Symptoms get worse or do not improve in 4 to 6 weeks, despite treatment.   New, unexplained symptoms develop. (Drugs used in treatment may produce side effects.)  EXERCISES  RANGE OF MOTION (ROM) AND STRETCHING EXERCISES - Impingement Syndrome (Rotator Cuff  Tendinitis, Bursitis) These exercises may help you when beginning to rehabilitate your injury. Your symptoms may go away with or without further involvement from your physician, physical therapist or athletic trainer. While completing these exercises, remember:   Restoring tissue flexibility helps normal motion to return to the joints. This allows healthier, less painful movement and activity.   An effective stretch should be held for at least 30 seconds.   A stretch should never be painful. You should only feel a gentle lengthening or release in the stretched tissue.  STRETCH - Flexion, Standing  Stand with good posture. With an underhand grip on your right / left hand, and an overhand grip on the opposite hand, grasp  a broomstick or cane so that your hands are a little more than shoulder width apart.   Keeping your right / left elbow straight and shoulder muscles relaxed, push the stick with your opposite hand, to raise your right / left arm in front of your body and then overhead. Raise your arm until you feel a stretch in your right / left shoulder, but before you have increased shoulder pain.   Try to avoid shrugging your right / left shoulder as your arm rises, by keeping your shoulder blade tucked down and toward your mid-back spine. Hold for __________ seconds.   Slowly return to the starting position.  Repeat __________ times. Complete this exercise __________ times per day. STRETCH - Abduction, Supine  Lie on your back. With an underhand grip on your right / left hand and an overhand grip on the opposite hand, grasp a broomstick or cane so that your hands are a little more than shoulder width apart.   Keeping your right / left elbow straight and your shoulder muscles relaxed, push the stick with your opposite hand, to raise your right / left arm out to the side of your body and then overhead. Raise your arm until you feel a stretch in your right / left shoulder, but before you have increased shoulder pain.   Try to avoid shrugging   your right / left shoulder as your arm rises, by keeping your shoulder blade tucked down and toward your mid-back spine. Hold for __________ seconds.   Slowly return to the starting position.  Repeat __________ times. Complete this exercise __________ times per day. ROM - Flexion, Active-Assisted  Lie on your back. You may bend your knees for comfort.   Grasp a broomstick or cane so your hands are about shoulder width apart. Your right / left hand should grip the end of the stick, so that your hand is positioned "thumbs-up," as if you were about to shake hands.   Using your healthy arm to lead, raise your right / left arm overhead, until you feel a gentle stretch in your  shoulder. Hold for __________ seconds.   Use the stick to assist in returning your right / left arm to its starting position.  Repeat __________ times. Complete this exercise __________ times per day.  ROM - Internal Rotation, Supine   Lie on your back on a firm surface. Place your right / left elbow about 60 degrees away from your side. Elevate your elbow with a folded towel, so that the elbow and shoulder are the same height.   Using a broomstick or cane and your strong arm, pull your right / left hand toward your body until you feel a gentle stretch, but no increase in your shoulder pain. Keep your shoulder and elbow in place throughout the exercise.   Hold for __________ seconds. Slowly return to the starting position.  Repeat __________ times. Complete this exercise __________ times per day. STRETCH - Internal Rotation  Place your right / left hand behind your back, palm up.   Throw a towel or belt over your opposite shoulder. Grasp the towel with your right / left hand.   While keeping an upright posture, gently pull up on the towel, until you feel a stretch in the front of your right / left shoulder.   Avoid shrugging your right / left shoulder as your arm rises, by keeping your shoulder blade tucked down and toward your mid-back spine.   Hold for __________ seconds. Release the stretch, by lowering your healthy hand.  Repeat __________ times. Complete this exercise __________ times per day. ROM - Internal Rotation   Using an underhand grip, grasp a stick behind your back with both hands.   While standing upright with good posture, slide the stick up your back until you feel a mild stretch in the front of your shoulder.   Hold for __________ seconds. Slowly return to your starting position.  Repeat __________ times. Complete this exercise __________ times per day.  STRETCH - Posterior Shoulder Capsule   Stand or sit with good posture. Grasp your right / left elbow and draw it  across your chest, keeping it at the same height as your shoulder.   Pull your elbow, so your upper arm comes in closer to your chest. Pull until you feel a gentle stretch in the back of your shoulder.   Hold for __________ seconds.  Repeat __________ times. Complete this exercise __________ times per day. STRENGTHENING EXERCISES - Impingement Syndrome (Rotator Cuff Tendinitis, Bursitis) These exercises may help you when beginning to rehabilitate your injury. They may resolve your symptoms with or without further involvement from your physician, physical therapist or athletic trainer. While completing these exercises, remember:  Muscles can gain both the endurance and the strength needed for everyday activities through controlled exercises.   Complete these exercises as   instructed by your physician, physical therapist or athletic trainer. Increase the resistance and repetitions only as guided.   You may experience muscle soreness or fatigue, but the pain or discomfort you are trying to eliminate should never worsen during these exercises. If this pain does get worse, stop and make sure you are following the directions exactly. If the pain is still present after adjustments, discontinue the exercise until you can discuss the trouble with your clinician.   During your recovery, avoid activity or exercises which involve actions that place your injured hand or elbow above your head or behind your back or head. These positions stress the tissues which you are trying to heal.  STRENGTH - Scapular Depression and Adduction   With good posture, sit on a firm chair. Support your arms in front of you, with pillows, arm rests, or on a table top. Have your elbows in line with the sides of your body.   Gently draw your shoulder blades down and toward your mid-back spine. Gradually increase the tension, without tensing the muscles along the top of your shoulders and the back of your neck.   Hold for  __________ seconds. Slowly release the tension and relax your muscles completely before starting the next repetition.   After you have practiced this exercise, remove the arm support and complete the exercise in standing as well as sitting position.  Repeat __________ times. Complete this exercise __________ times per day.  STRENGTH - Shoulder Abductors, Isometric  With good posture, stand or sit about 4-6 inches from a wall, with your right / left side facing the wall.   Bend your right / left elbow. Gently press your right / left elbow into the wall. Increase the pressure gradually, until you are pressing as hard as you can, without shrugging your shoulder or increasing any shoulder discomfort.   Hold for __________ seconds.   Release the tension slowly. Relax your shoulder muscles completely before you begin the next repetition.  Repeat __________ times. Complete this exercise __________ times per day.  STRENGTH - External Rotators, Isometric  Keep your right / left elbow at your side and bend it 90 degrees.   Step into a door frame so that the outside of your right / left wrist can press against the door frame without your upper arm leaving your side.   Gently press your right / left wrist into the door frame, as if you were trying to swing the back of your hand away from your stomach. Gradually increase the tension, until you are pressing as hard as you can, without shrugging your shoulder or increasing any shoulder discomfort.   Hold for __________ seconds.   Release the tension slowly. Relax your shoulder muscles completely before you begin the next repetition.  Repeat __________ times. Complete this exercise __________ times per day.  STRENGTH - Supraspinatus   Stand or sit with good posture. Grasp a __________ weight, or an exercise band or tubing, so that your hand is "thumbs-up," like you are shaking hands.   Slowly lift your right / left arm in a "V" away from your thigh,  diagonally into the space between your side and straight ahead. Lift your hand to shoulder height or as far as you can, without increasing any shoulder pain. At first, many people do not lift their hands above shoulder height.   Avoid shrugging your right / left shoulder as your arm rises, by keeping your shoulder blade tucked down and toward your mid-back   spine.   Hold for __________ seconds. Control the descent of your hand, as you slowly return to your starting position.  Repeat __________ times. Complete this exercise __________ times per day.  STRENGTH - External Rotators  Secure a rubber exercise band or tubing to a fixed object (table, pole) so that it is at the same height as your right / left elbow when you are standing or sitting on a firm surface.   Stand or sit so that the secured exercise band is at your uninjured side.   Bend your right / left elbow 90 degrees. Place a folded towel or small pillow under your right / left arm, so that your elbow is a few inches away from your side.   Keeping the tension on the exercise band, pull it away from your body, as if pivoting on your elbow. Be sure to keep your body steady, so that the movement is coming only from your rotating shoulder.   Hold for __________ seconds. Release the tension in a controlled manner, as you return to the starting position.  Repeat __________ times. Complete this exercise __________ times per day.  STRENGTH - Internal Rotators   Secure a rubber exercise band or tubing to a fixed object (table, pole) so that it is at the same height as your right / left elbow when you are standing or sitting on a firm surface.   Stand or sit so that the secured exercise band is at your right / left side.   Bend your elbow 90 degrees. Place a folded towel or small pillow under your right / left arm so that your elbow is a few inches away from your side.   Keeping the tension on the exercise band, pull it across your body,  toward your stomach. Be sure to keep your body steady, so that the movement is coming only from your rotating shoulder.   Hold for __________ seconds. Release the tension in a controlled manner, as you return to the starting position.  Repeat __________ times. Complete this exercise __________ times per day.  STRENGTH - Scapular Protractors, Standing   Stand arms length away from a wall. Place your hands on the wall, keeping your elbows straight.   Begin by dropping your shoulder blades down and toward your mid-back spine.   To strengthen your protractors, keep your shoulder blades down, but slide them forward on your rib cage. It will feel as if you are lifting the back of your rib cage away from the wall. This is a subtle motion and can be challenging to complete. Ask your caregiver for further instruction, if you are not sure you are doing the exercise correctly.   Hold for __________ seconds. Slowly return to the starting position, resting the muscles completely before starting the next repetition.  Repeat __________ times. Complete this exercise __________ times per day. STRENGTH - Scapular Protractors, Supine  Lie on your back on a firm surface. Extend your right / left arm straight into the air while holding a __________ weight in your hand.   Keeping your head and back in place, lift your shoulder off the floor.   Hold for __________ seconds. Slowly return to the starting position, and allow your muscles to relax completely before starting the next repetition.  Repeat __________ times. Complete this exercise __________ times per day. STRENGTH - Scapular Protractors, Quadruped  Get onto your hands and knees, with your shoulders directly over your hands (or as close as you can   be, comfortably).   Keeping your elbows locked, lift the back of your rib cage up into your shoulder blades, so your mid-back rounds out. Keep your neck muscles relaxed.   Hold this position for __________  seconds. Slowly return to the starting position and allow your muscles to relax completely before starting the next repetition.  Repeat __________ times. Complete this exercise __________ times per day.  STRENGTH - Scapular Retractors  Secure a rubber exercise band or tubing to a fixed object (table, pole), so that it is at the height of your shoulders when you are either standing, or sitting on a firm armless chair.   With a palm down grip, grasp an end of the band in each hand. Straighten your elbows and lift your hands straight in front of you, at shoulder height. Step back, away from the secured end of the band, until it becomes tense.   Squeezing your shoulder blades together, draw your elbows back toward your sides, as you bend them. Keep your upper arms lifted away from your body throughout the exercise.   Hold for __________ seconds. Slowly ease the tension on the band, as you reverse the directions and return to the starting position.  Repeat __________ times. Complete this exercise __________ times per day. STRENGTH - Shoulder Extensors   Secure a rubber exercise band or tubing to a fixed object (table, pole) so that it is at the height of your shoulders when you are either standing, or sitting on a firm armless chair.   With a thumbs-up grip, grasp an end of the band in each hand. Straighten your elbows and lift your hands straight in front of you, at shoulder height. Step back, away from the secured end of the band, until it becomes tense.   Squeezing your shoulder blades together, pull your hands down to the sides of your thighs. Do not allow your hands to go behind you.   Hold for __________ seconds. Slowly ease the tension on the band, as you reverse the directions and return to the starting position.  Repeat __________ times. Complete this exercise __________ times per day.  STRENGTH - Scapular Retractors and External Rotators   Secure a rubber exercise band or tubing to a  fixed object (table, pole) so that it is at the height as your shoulders, when you are either standing, or sitting on a firm armless chair.   With a palm down grip, grasp an end of the band in each hand. Bend your elbows 90 degrees and lift your elbows to shoulder height, at your sides. Step back, away from the secured end of the band, until it becomes tense.   Squeezing your shoulder blades together, rotate your shoulders so that your upper arms and elbows remain stationary, but your fists travel upward to head height.   Hold for __________ seconds. Slowly ease the tension on the band, as you reverse the directions and return to the starting position.  Repeat __________ times. Complete this exercise __________ times per day.  STRENGTH - Scapular Retractors and External Rotators, Rowing   Secure a rubber exercise band or tubing to a fixed object (table, pole) so that it is at the height of your shoulders, when you are either standing, or sitting on a firm armless chair.   With a palm down grip, grasp an end of the band in each hand. Straighten your elbows and lift your hands straight in front of you, at shoulder height. Step back, away from the   secured end of the band, until it becomes tense.   Step 1: Squeeze your shoulder blades together. Bending your elbows, draw your hands to your chest, as if you are rowing a boat. At the end of this motion, your hands and elbow should be at shoulder height and your elbows should be out to your sides.   Step 2: Rotate your shoulders, to raise your hands above your head. Your forearms should be vertical and your upper arms should be horizontal.   Hold for __________ seconds. Slowly ease the tension on the band, as you reverse the directions and return to the starting position.  Repeat __________ times. Complete this exercise __________ times per day.  STRENGTH - Scapular Depressors  Find a sturdy chair without wheels, such as a dining room chair.    Keeping your feet on the floor, and your hands on the chair arms, lift your bottom up from the seat, and lock your elbows.   Keeping your elbows straight, allow gravity to pull your body weight down. Your shoulders will rise toward your ears.   Raise your body against gravity by drawing your shoulder blades down your back, shortening the distance between your shoulders and ears. Although your feet should always maintain contact with the floor, your feet should progressively support less body weight, as you get stronger.   Hold for __________ seconds. In a controlled and slow manner, lower your body weight to begin the next repetition.  Repeat __________ times. Complete this exercise __________ times per day.  Document Released: 04/29/2005 Document Revised: 04/18/2011 Document Reviewed: 08/11/2008 [employer] Patient Information 2012 [employer] 

## 2011-10-14 ENCOUNTER — Ambulatory Visit: Payer: Medicare Other | Attending: Specialist | Admitting: Physical Therapy

## 2011-10-14 DIAGNOSIS — M25619 Stiffness of unspecified shoulder, not elsewhere classified: Secondary | ICD-10-CM | POA: Insufficient documentation

## 2011-10-14 DIAGNOSIS — IMO0001 Reserved for inherently not codable concepts without codable children: Secondary | ICD-10-CM | POA: Insufficient documentation

## 2011-10-14 DIAGNOSIS — M25519 Pain in unspecified shoulder: Secondary | ICD-10-CM | POA: Insufficient documentation

## 2011-10-16 ENCOUNTER — Ambulatory Visit: Payer: Medicare Other | Admitting: Physical Therapy

## 2011-10-17 ENCOUNTER — Ambulatory Visit: Payer: Medicare Other | Admitting: Physical Therapy

## 2011-10-21 ENCOUNTER — Ambulatory Visit: Payer: Medicare Other | Admitting: Physical Therapy

## 2011-10-22 ENCOUNTER — Ambulatory Visit: Payer: Medicare Other | Admitting: Physical Therapy

## 2011-10-23 ENCOUNTER — Ambulatory Visit: Payer: Medicare Other | Admitting: Physical Therapy

## 2011-10-29 ENCOUNTER — Ambulatory Visit: Payer: Medicare Other | Admitting: Physical Therapy

## 2011-10-30 ENCOUNTER — Ambulatory Visit: Payer: Medicare Other | Admitting: Physical Therapy

## 2011-10-31 ENCOUNTER — Ambulatory Visit: Payer: Medicare Other | Admitting: Physical Therapy

## 2011-11-05 ENCOUNTER — Ambulatory Visit: Payer: Medicare Other | Admitting: Physical Therapy

## 2011-11-06 ENCOUNTER — Ambulatory Visit: Payer: Medicare Other | Admitting: Physical Therapy

## 2011-11-07 ENCOUNTER — Ambulatory Visit: Payer: Medicare Other | Admitting: Physical Therapy

## 2011-11-11 ENCOUNTER — Ambulatory Visit: Payer: Medicare Other | Attending: Specialist | Admitting: Physical Therapy

## 2011-11-11 DIAGNOSIS — IMO0001 Reserved for inherently not codable concepts without codable children: Secondary | ICD-10-CM | POA: Insufficient documentation

## 2011-11-11 DIAGNOSIS — M25619 Stiffness of unspecified shoulder, not elsewhere classified: Secondary | ICD-10-CM | POA: Insufficient documentation

## 2011-11-12 ENCOUNTER — Ambulatory Visit: Payer: Medicare Other | Admitting: Physical Therapy

## 2011-11-18 ENCOUNTER — Ambulatory Visit: Payer: Medicare Other | Admitting: Physical Therapy

## 2011-11-19 ENCOUNTER — Ambulatory Visit: Payer: Medicare Other | Admitting: Physical Therapy

## 2011-11-20 ENCOUNTER — Ambulatory Visit: Payer: Medicare Other | Admitting: Physical Therapy

## 2011-11-25 ENCOUNTER — Ambulatory Visit: Payer: Medicare Other | Admitting: Physical Therapy

## 2011-11-26 ENCOUNTER — Encounter: Payer: Medicare Other | Admitting: Physical Therapy

## 2011-11-28 ENCOUNTER — Encounter: Payer: Medicare Other | Admitting: Physical Therapy

## 2011-12-02 ENCOUNTER — Encounter: Payer: Medicare Other | Admitting: Physical Therapy

## 2011-12-04 ENCOUNTER — Encounter: Payer: Medicare Other | Admitting: Physical Therapy

## 2011-12-05 ENCOUNTER — Encounter: Payer: Medicare Other | Admitting: Physical Therapy

## 2011-12-09 ENCOUNTER — Encounter: Payer: Medicare Other | Admitting: Physical Therapy

## 2014-07-12 DIAGNOSIS — M1712 Unilateral primary osteoarthritis, left knee: Secondary | ICD-10-CM | POA: Diagnosis not present

## 2014-07-12 DIAGNOSIS — M25562 Pain in left knee: Secondary | ICD-10-CM | POA: Diagnosis not present

## 2015-03-20 DIAGNOSIS — N390 Urinary tract infection, site not specified: Secondary | ICD-10-CM | POA: Diagnosis not present

## 2015-03-20 DIAGNOSIS — R102 Pelvic and perineal pain: Secondary | ICD-10-CM | POA: Diagnosis not present

## 2015-04-05 DIAGNOSIS — M25512 Pain in left shoulder: Secondary | ICD-10-CM | POA: Diagnosis not present

## 2016-03-08 DIAGNOSIS — J029 Acute pharyngitis, unspecified: Secondary | ICD-10-CM | POA: Diagnosis not present

## 2016-05-04 ENCOUNTER — Ambulatory Visit (INDEPENDENT_AMBULATORY_CARE_PROVIDER_SITE_OTHER): Payer: Commercial Managed Care - HMO | Admitting: Emergency Medicine

## 2016-05-04 DIAGNOSIS — M7052 Other bursitis of knee, left knee: Secondary | ICD-10-CM | POA: Diagnosis not present

## 2016-05-04 DIAGNOSIS — M25562 Pain in left knee: Secondary | ICD-10-CM | POA: Diagnosis not present

## 2016-05-04 MED ORDER — PREDNISONE 20 MG PO TABS: 20.0000 mg | ORAL_TABLET | Freq: Two times a day (BID) | ORAL | 0 refills | Status: AC

## 2016-05-04 NOTE — Progress Notes (Signed)
Lisa Owens is a 75 y.o. female   HPI:  Knee Pain: Patient presents with knee pain involving the  left knee. Onset of the symptoms was several days ago. Inciting event: none known. Current symptoms include stiffness and swelling. Pain is aggravated by any weight bearing.  Patient has had prior knee problems. Evaluation to date: none. Treatment to date: none.  Past Medical History:  Diagnosis Date  . Headache(784.0)    sinus   Past Surgical History:  Procedure Laterality Date  . BUNIONECTOMY  2001   along with hammer toe  . CHOLECYSTECTOMY  2007  . EYE SURGERY  2011   bilateral cataract  . [place]  2001  . SHOULDER OPEN ROTATOR CUFF REPAIR  09/19/2011   Procedure: ROTATOR CUFF REPAIR SHOULDER OPEN;  Surgeon: Johnn Hai, MD;  Location: [facility];  Service: Orthopedics;  Laterality: Right;  . SUBACROMIAL DECOMPRESSION  09/19/2011   Procedure: SUBACROMIAL DECOMPRESSION;  Surgeon: Johnn Hai, MD;  Location: [facility];  Service: Orthopedics;  Laterality: Right;  . tummy tuck  2006  . UMBILICAL HERNIA REPAIR  2007   along with gallbladder   No current outpatient prescriptions on file. No Known Allergies  reports that she has never smoked. She does not have any smokeless tobacco history on file. She reports that she drinks about 0.6 oz of alcohol per week . She reports that she does not use drugs. No family history on file. Review of Systems  Constitutional: Negative.   HENT: Negative.   Eyes: Negative.   Respiratory: Negative.   Cardiovascular: Negative.   Gastrointestinal: Negative.   Genitourinary: Negative.   Musculoskeletal: Positive for joint pain (left knee).  Skin: Negative.   Neurological: Negative.   Endo/Heme/Allergies: Negative.    Physical Exam  Constitutional: She is oriented to person, place, and time. She appears well-developed and well-nourished.  HENT:  Head: Normocephalic and atraumatic.  Eyes: Conjunctivae and EOM are normal. Pupils are equal,  round, and reactive to light.  Neck: Normal range of motion. Neck supple.  Cardiovascular: Normal rate, regular rhythm and normal heart sounds.   Pulmonary/Chest: Effort normal and breath sounds normal.  Abdominal: Soft.  Musculoskeletal: Normal range of motion.  Neurological: She is alert and oriented to person, place, and time.  Skin: Skin is warm and dry.  Psychiatric: She has a normal mood and affect.    Knee: No erythema or tenderness, no obvious bony abnormalities. Palpation normal with no warmth or joint line tenderness or patellar tenderness or condyle tenderness. ROM normal in flexion and extension and lower leg rotation. Ligaments with solid consistent endpoints including ACL, PCL, LCL, MCL. Negative Mcmurray's and provocative meniscal tests. Non painful patellar compression. Patellar and quadriceps tendons unremarkable. Hamstring and quadriceps strength is normal. Kariss was seen today for knee pain.  Diagnoses and all orders for this visit:  Acute pain of left knee -     predniSONE (DELTASONE) 20 MG tablet; Take 1 tablet (20 mg total) by mouth 2 (two) times daily with a meal. -     Care order/instruction:  Bursitis of left knee, unspecified bursa -     predniSONE (DELTASONE) 20 MG tablet; Take 1 tablet (20 mg total) by mouth 2 (two) times daily with a meal.

## 2016-05-04 NOTE — Patient Instructions (Addendum)
     IF you received an x-ray today, you will receive an invoice from [facility]. Please contact [facility] at [phone] with questions or concerns regarding your invoice.   IF you received labwork today, you will receive an invoice from [facility]. Please contact [facility] at [phone] with questions or concerns regarding your invoice.   Our billing staff will not be able to assist you with questions regarding bills from these companies.  You will be contacted with the lab results as soon as they are available. The fastest way to get your results is to activate your My Chart account. Instructions are located on the last page of this paperwork. If you have not heard from [name] regarding the results in 2 weeks, please contact this office.       Place knee pain patient instructions here.  Knee Pain Knee pain is a very common symptom and can have many causes. Knee pain often goes away when you follow your health care provider's instructions for relieving pain and discomfort at home. However, knee pain can develop into a condition that needs treatment. Some conditions may include:  Arthritis caused by wear and tear (osteoarthritis).  Arthritis caused by swelling and irritation (rheumatoid arthritis or gout).  A cyst or growth in your knee.  An infection in your knee joint.  An injury that will not heal.  Damage, swelling, or irritation of the tissues that support your knee (torn ligaments or tendinitis). If your knee pain continues, additional tests may be ordered to diagnose your condition. Tests may include X-rays or other imaging studies of your knee. You may also need to have fluid removed from your knee. Treatment for ongoing knee pain depends on the cause, but treatment may include:  Medicines to relieve pain or swelling.  Steroid injections in your knee.  Physical therapy.  Surgery. HOME CARE INSTRUCTIONS  Take medicines only as directed by your health  care provider.  Rest your knee and keep it raised (elevated) while you are resting.  Do not do things that cause or worsen pain.  Avoid high-impact activities or exercises, such as running, jumping rope, or doing jumping jacks.  Apply ice to the knee area:  Put ice in a plastic bag.  Place a towel between your skin and the bag.  Leave the ice on for 20 minutes, 2-3 times a day.  Ask your health care provider if you should wear an elastic knee support.  Keep a pillow under your knee when you sleep.  Lose weight if you are overweight. Extra weight can put pressure on your knee.  Do not use any tobacco products, including cigarettes, chewing tobacco, or electronic cigarettes. If you need help quitting, ask your health care provider. Smoking may slow the healing of any bone and joint problems that you may have. SEEK MEDICAL CARE IF:  Your knee pain continues, changes, or gets worse.  You have a fever along with knee pain.  Your knee buckles or locks up.  Your knee becomes more swollen. SEEK IMMEDIATE MEDICAL CARE IF:   Your knee joint feels hot to the touch.  You have chest pain or trouble breathing. This information is not intended to replace advice given to you by your health care provider. Make sure you discuss any questions you have with your health care provider. Document Released: 02/24/2007 Document Revised: 05/20/2014 Document Reviewed: 12/13/2013 Elsevier Interactive Patient Education  2017 [employer].

## 2016-05-16 DIAGNOSIS — R600 Localized edema: Secondary | ICD-10-CM | POA: Diagnosis not present

## 2016-05-20 DIAGNOSIS — M25562 Pain in left knee: Secondary | ICD-10-CM | POA: Diagnosis not present

## 2016-05-24 DIAGNOSIS — M25562 Pain in left knee: Secondary | ICD-10-CM | POA: Diagnosis not present

## 2016-05-27 ENCOUNTER — Ambulatory Visit (HOSPITAL_BASED_OUTPATIENT_CLINIC_OR_DEPARTMENT_OTHER)
Admission: RE | Admit: 2016-05-27 | Discharge: 2016-05-27 | Disposition: A | Discharge status: Home / Self Care | Payer: Medicare HMO | Source: Ambulatory Visit | Attending: Family Medicine | Admitting: Family Medicine

## 2016-05-27 ENCOUNTER — Other Ambulatory Visit (HOSPITAL_BASED_OUTPATIENT_CLINIC_OR_DEPARTMENT_OTHER): Payer: Self-pay | Admitting: Family Medicine

## 2016-05-27 DIAGNOSIS — I82412 Acute embolism and thrombosis of left femoral vein: Secondary | ICD-10-CM | POA: Diagnosis not present

## 2016-05-27 DIAGNOSIS — I8002 Phlebitis and thrombophlebitis of superficial vessels of left lower extremity: Secondary | ICD-10-CM | POA: Insufficient documentation

## 2016-05-27 DIAGNOSIS — M7989 Other specified soft tissue disorders: Secondary | ICD-10-CM | POA: Diagnosis present

## 2016-05-27 DIAGNOSIS — R52 Pain, unspecified: Secondary | ICD-10-CM

## 2016-05-27 DIAGNOSIS — R600 Localized edema: Secondary | ICD-10-CM | POA: Diagnosis not present

## 2016-06-21 DIAGNOSIS — I82412 Acute embolism and thrombosis of left femoral vein: Secondary | ICD-10-CM | POA: Diagnosis not present

## 2016-07-17 DIAGNOSIS — M1712 Unilateral primary osteoarthritis, left knee: Secondary | ICD-10-CM | POA: Diagnosis not present

## 2016-07-17 DIAGNOSIS — M25561 Pain in right knee: Secondary | ICD-10-CM | POA: Diagnosis not present

## 2016-07-17 DIAGNOSIS — M1711 Unilateral primary osteoarthritis, right knee: Secondary | ICD-10-CM | POA: Diagnosis not present

## 2016-07-17 DIAGNOSIS — M25562 Pain in left knee: Secondary | ICD-10-CM | POA: Diagnosis not present

## 2016-07-19 DIAGNOSIS — M1712 Unilateral primary osteoarthritis, left knee: Secondary | ICD-10-CM | POA: Diagnosis not present

## 2016-07-26 DIAGNOSIS — M1712 Unilateral primary osteoarthritis, left knee: Secondary | ICD-10-CM | POA: Diagnosis not present

## 2016-08-02 DIAGNOSIS — M1712 Unilateral primary osteoarthritis, left knee: Secondary | ICD-10-CM | POA: Diagnosis not present

## 2016-08-28 DIAGNOSIS — I82412 Acute embolism and thrombosis of left femoral vein: Secondary | ICD-10-CM | POA: Diagnosis not present

## 2016-11-19 DIAGNOSIS — M1711 Unilateral primary osteoarthritis, right knee: Secondary | ICD-10-CM | POA: Diagnosis not present

## 2016-11-19 DIAGNOSIS — M170 Bilateral primary osteoarthritis of knee: Secondary | ICD-10-CM | POA: Diagnosis not present

## 2016-11-19 DIAGNOSIS — M1712 Unilateral primary osteoarthritis, left knee: Secondary | ICD-10-CM | POA: Diagnosis not present

## 2016-11-25 DIAGNOSIS — Z0000 Encounter for general adult medical examination without abnormal findings: Secondary | ICD-10-CM | POA: Diagnosis not present

## 2016-11-25 DIAGNOSIS — I82412 Acute embolism and thrombosis of left femoral vein: Secondary | ICD-10-CM | POA: Diagnosis not present

## 2016-11-25 DIAGNOSIS — M170 Bilateral primary osteoarthritis of knee: Secondary | ICD-10-CM | POA: Diagnosis not present

## 2016-11-25 DIAGNOSIS — Z23 Encounter for immunization: Secondary | ICD-10-CM | POA: Diagnosis not present

## 2016-11-25 DIAGNOSIS — E782 Mixed hyperlipidemia: Secondary | ICD-10-CM | POA: Diagnosis not present

## 2016-11-26 ENCOUNTER — Other Ambulatory Visit: Payer: Self-pay | Admitting: Family Medicine

## 2016-11-26 DIAGNOSIS — Z1231 Encounter for screening mammogram for malignant neoplasm of breast: Secondary | ICD-10-CM

## 2016-11-28 ENCOUNTER — Other Ambulatory Visit: Payer: Self-pay

## 2016-11-28 DIAGNOSIS — I82412 Acute embolism and thrombosis of left femoral vein: Secondary | ICD-10-CM

## 2016-12-13 DIAGNOSIS — Z1212 Encounter for screening for malignant neoplasm of rectum: Secondary | ICD-10-CM | POA: Diagnosis not present

## 2016-12-13 DIAGNOSIS — Z1211 Encounter for screening for malignant neoplasm of colon: Secondary | ICD-10-CM | POA: Diagnosis not present

## 2016-12-19 ENCOUNTER — Ambulatory Visit
Admission: RE | Admit: 2016-12-19 | Discharge: 2016-12-19 | Disposition: A | Discharge status: Home / Self Care | Payer: Medicare HMO | Source: Ambulatory Visit | Attending: Family Medicine | Admitting: Family Medicine

## 2016-12-19 DIAGNOSIS — Z1231 Encounter for screening mammogram for malignant neoplasm of breast: Secondary | ICD-10-CM

## 2016-12-27 ENCOUNTER — Encounter: Payer: Self-pay | Admitting: Vascular Surgery

## 2017-01-02 DIAGNOSIS — E2839 Other primary ovarian failure: Secondary | ICD-10-CM | POA: Diagnosis not present

## 2017-01-02 DIAGNOSIS — M8588 Other specified disorders of bone density and structure, other site: Secondary | ICD-10-CM | POA: Diagnosis not present

## 2017-01-08 ENCOUNTER — Ambulatory Visit (HOSPITAL_COMMUNITY)
Admission: RE | Admit: 2017-01-08 | Discharge: 2017-01-08 | Disposition: A | Discharge status: Home / Self Care | Payer: Medicare HMO | Source: Ambulatory Visit | Attending: Vascular Surgery | Admitting: Vascular Surgery

## 2017-01-08 ENCOUNTER — Encounter: Payer: Self-pay | Admitting: Vascular Surgery

## 2017-01-08 ENCOUNTER — Ambulatory Visit (INDEPENDENT_AMBULATORY_CARE_PROVIDER_SITE_OTHER): Payer: Medicare HMO | Admitting: Vascular Surgery

## 2017-01-08 VITALS — BP 152/86 | HR 72 | Temp 98.3°F | Resp 16 | Ht 63.5 in | Wt 205.0 lb

## 2017-01-08 DIAGNOSIS — I82412 Acute embolism and thrombosis of left femoral vein: Secondary | ICD-10-CM | POA: Insufficient documentation

## 2017-01-08 DIAGNOSIS — I739 Peripheral vascular disease, unspecified: Secondary | ICD-10-CM | POA: Insufficient documentation

## 2017-01-08 DIAGNOSIS — I82402 Acute embolism and thrombosis of unspecified deep veins of left lower extremity: Secondary | ICD-10-CM

## 2017-01-08 NOTE — Progress Notes (Signed)
Patient name: Lisa Owens MRN: [id number] DOB: February 08, 1941 Sex: female   REASON FOR CONSULT:    Left lower extremity DVT. The consult is requested by Dr. Lennette Bihari Via  HPI:   Lisa Owens is a pleasant 76 y.o. female, who has a history of a left lower extremity DVT. She is considering undergoing knee replacement on the left and wanted to discuss possible placement of an IVC filter. She was diagnosed with a DVT of the left common femoral vein in January of this year. She was treated with 3 months of Eliquis. She is unaware of any previous history of DVT or phlebitis. She has no clotting disorders that she is aware of.  She has significant osteoarthritis of both knees with pain in both knees aggravated by activity. Her symptoms are more significant on the left side.  I have reviewed the records that were sent from [place]. The patient was seen for an adult wellness visit in July 2018. The patient has a history of mixed hyperlipidemia, osteoarthritis of both knees, and also a history of DVT of the left lower extremity. The patient was treated with Eliquis for the DVT. Swelling improved significantly. She had some concerns about whether or not she should have an IVC filter placed prior to her upcoming total knee replacement.  I have also reviewed the records from Dr. Reather Littler office. She is being considered for total left knee replacement.  Past Medical History:  Diagnosis Date  . Arthritis   . DVT (deep venous thrombosis) ([place])   . Headache(784.0)    sinus    No family history on file.  There is no family history of premature cardiovascular disease.  SOCIAL HISTORY: She is not a smoker. Social History   Social History  . Marital status: Married    Spouse name: N/A  . Number of children: N/A  . Years of education: N/A   Occupational History  . Not on file.   Social History Main Topics  . Smoking status: Never Smoker  . Smokeless tobacco: Never Used  . Alcohol use  0.6 oz/week    1 Glasses of wine per week     Comment: socially  . Drug use: No  . Sexual activity: Not on file   Other Topics Concern  . Not on file   Social History Narrative  . No narrative on file    No Known Allergies  No current outpatient prescriptions on file.   No current facility-administered medications for this visit.     REVIEW OF SYSTEMS:  [X]  denotes positive finding, [ ]  denotes negative finding Cardiac  Comments:  Chest pain or chest pressure:    Shortness of breath upon exertion:    Short of breath when lying flat:    Irregular heart rhythm:        Vascular    Pain in calf, thigh, or hip brought on by ambulation:    Pain in feet at night that wakes you up from your sleep:     Blood clot in your veins:    Leg swelling:         Pulmonary    Oxygen at home:    Productive cough:     Wheezing:         Neurologic    Sudden weakness in arms or legs:     Sudden numbness in arms or legs:     Sudden onset of difficulty speaking or slurred speech:    Temporary loss of  vision in one eye:     Problems with dizziness:         Gastrointestinal    Blood in stool:     Vomited blood:         Genitourinary    Burning when urinating:     Blood in urine:        Psychiatric    Major depression:         Hematologic    Bleeding problems:    Problems with blood clotting too easily:        Skin    Rashes or ulcers:        Constitutional    Fever or chills:     PHYSICAL EXAM:   Vitals:   01/08/17 1252  BP: (!) 152/86  Pulse: 72  Resp: 16  Temp: 98.3 F (36.8 C)  SpO2: 93%  Weight: 205 lb (93 kg)  Height: 5' 3.5" (1.613 m)    GENERAL: The patient is a well-nourished female, in no acute distress. The vital signs are documented above. CARDIAC: There is a regular rate and rhythm.  VASCULAR: I do not detect carotid bruits. She has palpable femoral and pedal pulses bilaterally. She has mild bilateral lower extremity swelling. PULMONARY: There is  good air exchange bilaterally without wheezing or rales. ABDOMEN: Soft and non-tender with normal pitched bowel sounds.  MUSCULOSKELETAL: There are no major deformities or cyanosis. NEUROLOGIC: No focal weakness or paresthesias are detected. SKIN: There are no ulcers or rashes noted. PSYCHIATRIC: The patient has a normal affect.  DATA:    VENOUS DUPLEX: I reviewed the venous duplex scan that was done in January of this year for left leg swelling. This showed superficial thrombophlebitis involving the left great saphenous vein with extension into the common femoral vein. The patient has nonocclusive clot in the common femoral vein.  LEFT LOWER EXTREMITY VENOUS DUPLEX: I have independently interpreted the duplex of the left lower extremity. There is chronic thrombus noted in the left common femoral vein. There is reflux noted in the deep system involving the common femoral vein and popliteal vein. There is also reflux in the superficial system involving the left great saphenous vein.   MEDICAL ISSUES:   LEFT COMMON FEMORAL VEIN DVT:  This patient has a DVT involving the left common femoral vein which is chronic. This was diagnosed in January. I think the risk of embolization is small. Given that she has had a DVT then certainly she is at increased risk for DVT in the future and will be at slightly higher risk for DVT around the time of her knee replacement. I do not think however that there is an absolute indication for placement of an IVC filter. She should receive aggressive perioperative DVT prophylaxis. I would recommend obtaining a duplex scan prior to her surgery to be sure that there is no propagation of the clot or see if the clot may have even resolved. She is not sure when she plans on scheduling surgery and so we will not schedule a duplex until she knows when she plans on proceeding. If she has propagation of her clot and we could reconsider preoperative placement of a temporary IVC filter.  If the clot has resolved then certainly I think she would be a lower risk for perioperative PE.  We have discussed the potential complications related to placement of an IVC filter including the risk of migration, pulmonary embolus, IVC thrombosis. In addition we have discussed the potential complications of  retrieval of the filter. She is in agreement that she is reluctant to consider placement of a filter unless there is a stronger indication. I'll be happy to see her back and arrange for a duplex scan closer to the time of her surgery.  Deitra Mayo [facility] [phone]

## 2017-04-01 IMAGING — US US EXTREM LOW VENOUS*L*
1 series · 13 of 24 positions shown · non-contrast
Comparison: None.

CLINICAL DATA: Left knee pain and edema.



[Series 1: us extrem low venous*left* · 0.09mm/px · 31 acquisitions, 13 frames shown]
[im 1/31]
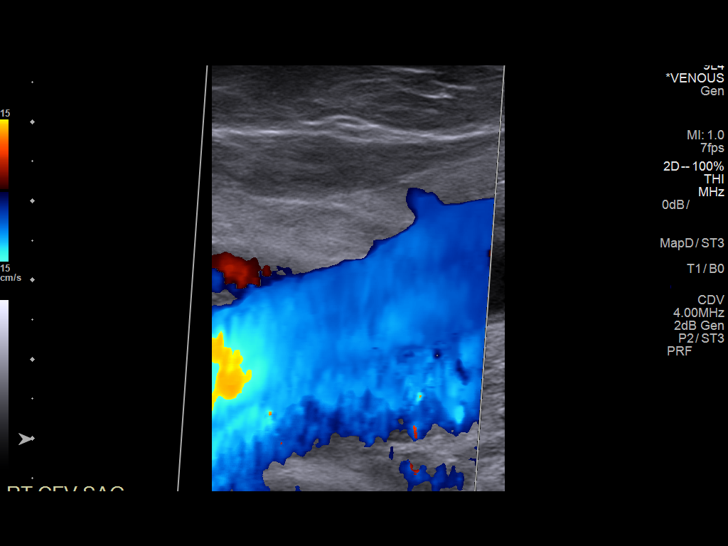
[im 3/31]
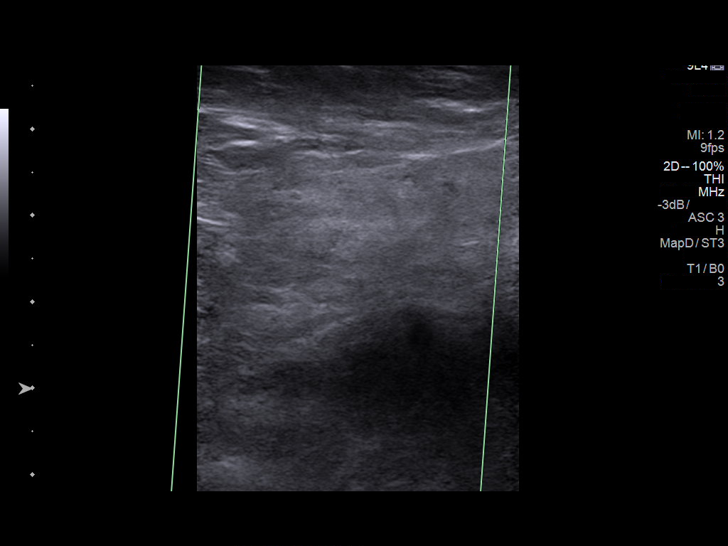
[im 6/31]
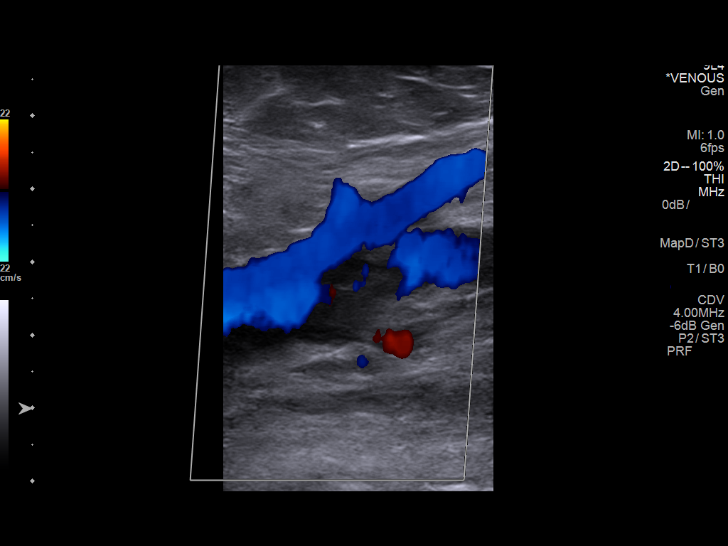
[im 7/31]
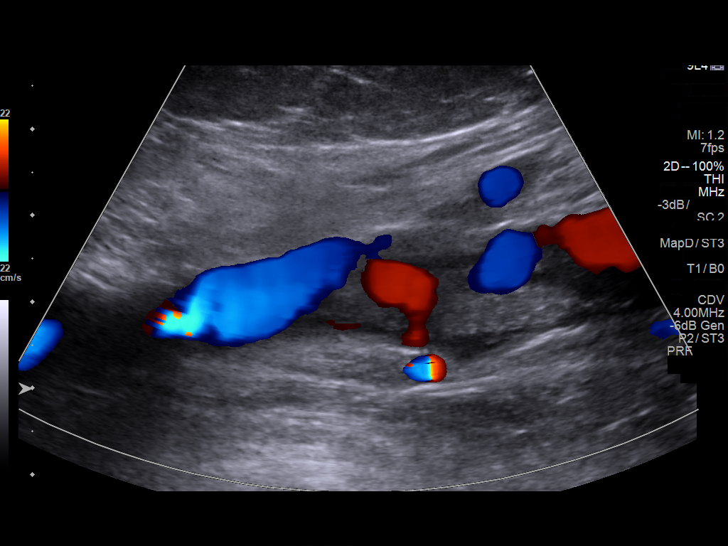
[im 10/31]
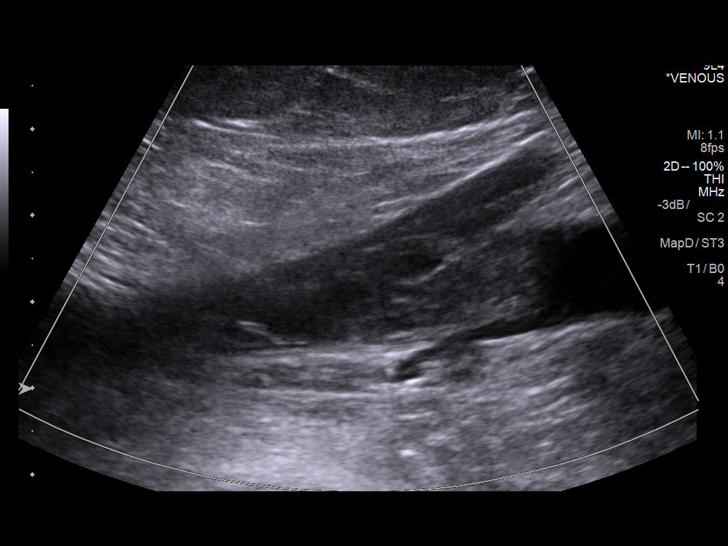
[im 12/31]
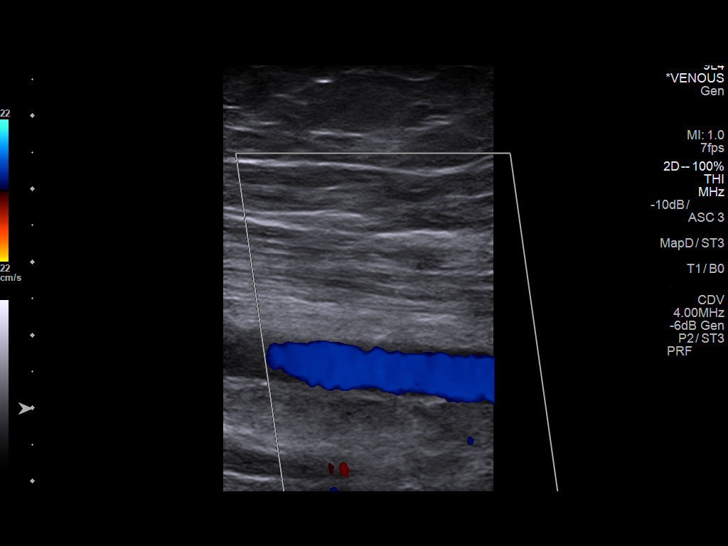
[im 16/31]
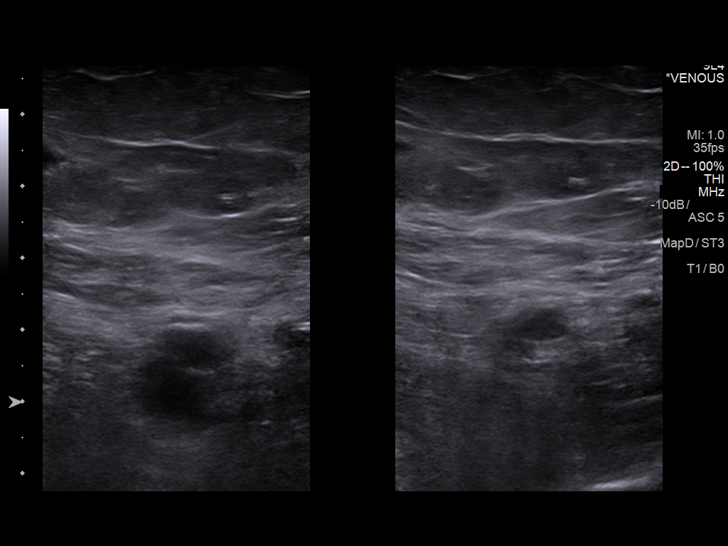
[im 17/31]
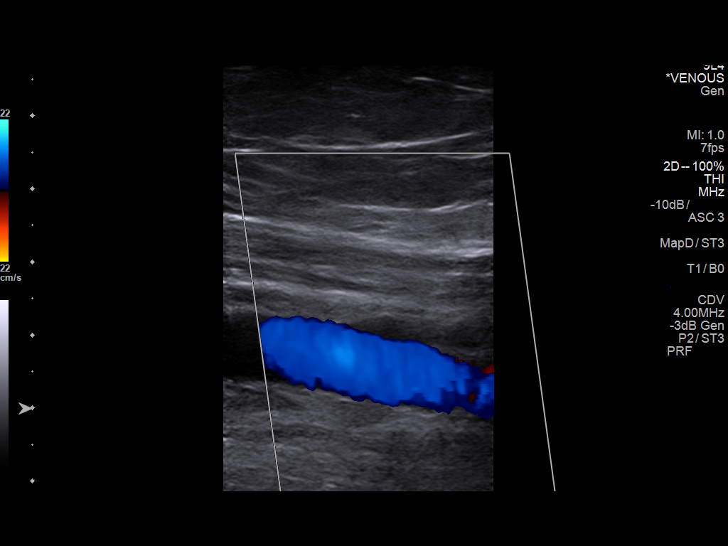
[im 20/31]
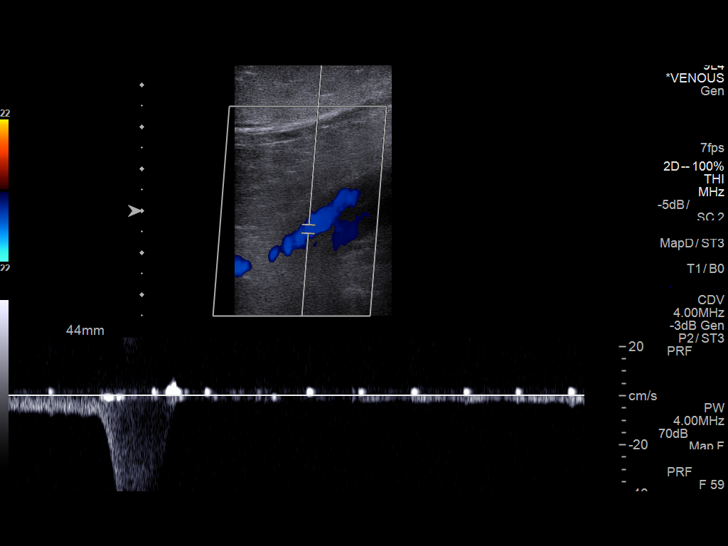
[im 23/31]
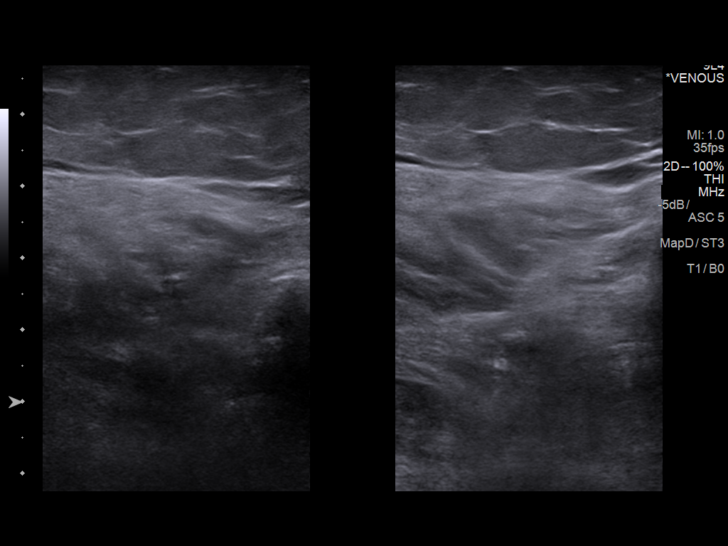
[im 24/31]
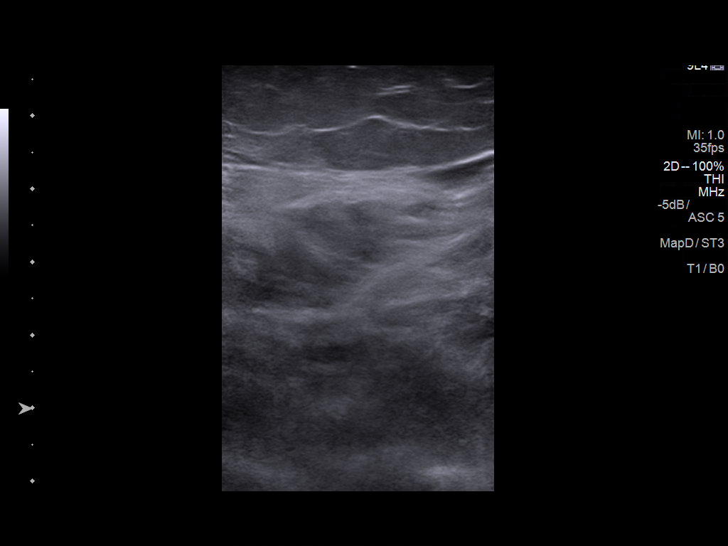
[im 28/31]
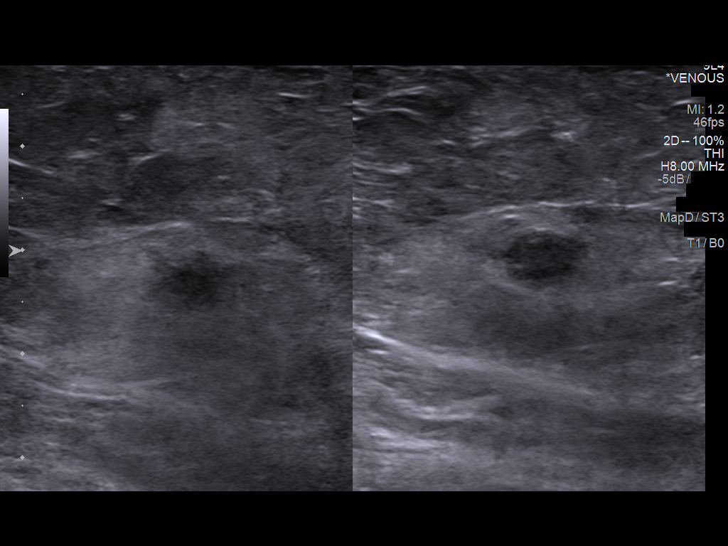
[im 31/31]
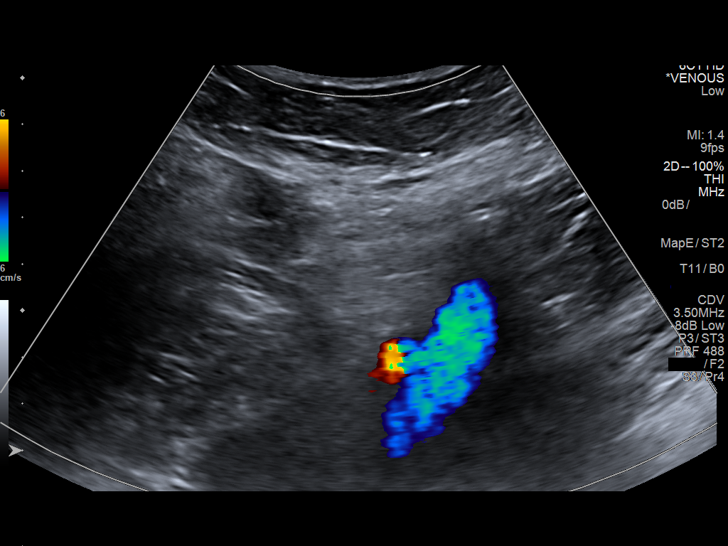

[13 of 24 positions shown; findings below may reference images not displayed]

FINDINGS: Contralateral Common Femoral Vein: Respiratory phasicity is normal
and symmetric with the symptomatic side. No evidence of thrombus.
Normal compressibility.

Common Femoral Vein: Nonocclusive thrombus present in the lumen of
the common femoral vein extending from the great saphenous vein
through the saphenofemoral junction.

Saphenofemoral Junction: Nonocclusive thrombus present.

Profunda Femoral Vein: No evidence of thrombus. Normal
compressibility and flow on color Doppler imaging.

Femoral Vein: No evidence of thrombus. Normal compressibility,
respiratory phasicity and response to augmentation.

Popliteal Vein: No evidence of thrombus. Normal compressibility,
respiratory phasicity and response to augmentation.

Calf Veins: No evidence of thrombus. Normal compressibility and flow
on color Doppler imaging.

Superficial Great Saphenous Vein: The great saphenous vein
demonstrates thrombus extending from roughly the knee up into the
proximal thigh.

Venous Reflux:  None.

Other Findings:  None.
IMPRESSION: Superficial thrombophlebitis involving the great saphenous vein with
component of extension into the common femoral vein. Common femoral
component is consistent with nonocclusive DVT.

## 2017-05-29 DIAGNOSIS — M170 Bilateral primary osteoarthritis of knee: Secondary | ICD-10-CM | POA: Diagnosis not present

## 2017-05-29 DIAGNOSIS — E782 Mixed hyperlipidemia: Secondary | ICD-10-CM | POA: Diagnosis not present

## 2017-05-29 DIAGNOSIS — Z86718 Personal history of other venous thrombosis and embolism: Secondary | ICD-10-CM | POA: Diagnosis not present

## 2017-05-29 DIAGNOSIS — M25462 Effusion, left knee: Secondary | ICD-10-CM | POA: Diagnosis not present

## 2017-05-29 DIAGNOSIS — M85852 Other specified disorders of bone density and structure, left thigh: Secondary | ICD-10-CM | POA: Diagnosis not present

## 2017-09-17 DIAGNOSIS — R208 Other disturbances of skin sensation: Secondary | ICD-10-CM | POA: Diagnosis not present

## 2017-09-17 DIAGNOSIS — Z86718 Personal history of other venous thrombosis and embolism: Secondary | ICD-10-CM | POA: Diagnosis not present

## 2017-09-17 DIAGNOSIS — R200 Anesthesia of skin: Secondary | ICD-10-CM | POA: Diagnosis not present

## 2017-11-20 ENCOUNTER — Ambulatory Visit (HOSPITAL_BASED_OUTPATIENT_CLINIC_OR_DEPARTMENT_OTHER)
Admission: RE | Admit: 2017-11-20 | Discharge: 2017-11-20 | Disposition: A | Discharge status: Home / Self Care | Payer: Medicare HMO | Source: Ambulatory Visit | Attending: Family Medicine | Admitting: Family Medicine

## 2017-11-20 ENCOUNTER — Other Ambulatory Visit (HOSPITAL_BASED_OUTPATIENT_CLINIC_OR_DEPARTMENT_OTHER): Payer: Self-pay | Admitting: Family Medicine

## 2017-11-20 DIAGNOSIS — M7989 Other specified soft tissue disorders: Secondary | ICD-10-CM

## 2017-11-20 DIAGNOSIS — Z86718 Personal history of other venous thrombosis and embolism: Secondary | ICD-10-CM | POA: Diagnosis not present

## 2017-11-20 DIAGNOSIS — I82441 Acute embolism and thrombosis of right tibial vein: Secondary | ICD-10-CM | POA: Diagnosis not present

## 2017-11-20 DIAGNOSIS — I82811 Embolism and thrombosis of superficial veins of right lower extremities: Secondary | ICD-10-CM | POA: Diagnosis not present

## 2017-12-04 DIAGNOSIS — I82441 Acute embolism and thrombosis of right tibial vein: Secondary | ICD-10-CM | POA: Diagnosis not present

## 2017-12-04 DIAGNOSIS — E782 Mixed hyperlipidemia: Secondary | ICD-10-CM | POA: Diagnosis not present

## 2017-12-04 DIAGNOSIS — M47816 Spondylosis without myelopathy or radiculopathy, lumbar region: Secondary | ICD-10-CM | POA: Diagnosis not present

## 2017-12-04 DIAGNOSIS — Z0000 Encounter for general adult medical examination without abnormal findings: Secondary | ICD-10-CM | POA: Diagnosis not present

## 2017-12-04 DIAGNOSIS — Z1389 Encounter for screening for other disorder: Secondary | ICD-10-CM | POA: Diagnosis not present

## 2017-12-04 DIAGNOSIS — Z6838 Body mass index (BMI) 38.0-38.9, adult: Secondary | ICD-10-CM | POA: Diagnosis not present

## 2017-12-04 DIAGNOSIS — M170 Bilateral primary osteoarthritis of knee: Secondary | ICD-10-CM | POA: Diagnosis not present

## 2018-01-11 DIAGNOSIS — K5792 Diverticulitis of intestine, part unspecified, without perforation or abscess without bleeding: Secondary | ICD-10-CM

## 2018-01-11 HISTORY — DX: Diverticulitis of intestine, part unspecified, without perforation or abscess without bleeding: K57.92

## 2018-01-21 ENCOUNTER — Encounter (HOSPITAL_COMMUNITY): Payer: Self-pay

## 2018-01-21 ENCOUNTER — Inpatient Hospital Stay (HOSPITAL_COMMUNITY)
Admission: EM | Admit: 2018-01-21 | Discharge: 2018-01-23 | DRG: 378 | Disposition: A | Discharge status: Home / Self Care | Payer: Medicare HMO | Attending: Nephrology | Admitting: Nephrology

## 2018-01-21 ENCOUNTER — Other Ambulatory Visit: Payer: Self-pay

## 2018-01-21 DIAGNOSIS — D649 Anemia, unspecified: Secondary | ICD-10-CM

## 2018-01-21 DIAGNOSIS — R7989 Other specified abnormal findings of blood chemistry: Secondary | ICD-10-CM | POA: Diagnosis present

## 2018-01-21 DIAGNOSIS — D500 Iron deficiency anemia secondary to blood loss (chronic): Secondary | ICD-10-CM | POA: Diagnosis not present

## 2018-01-21 DIAGNOSIS — N179 Acute kidney failure, unspecified: Secondary | ICD-10-CM | POA: Diagnosis present

## 2018-01-21 DIAGNOSIS — Z86718 Personal history of other venous thrombosis and embolism: Secondary | ICD-10-CM | POA: Diagnosis not present

## 2018-01-21 DIAGNOSIS — R531 Weakness: Secondary | ICD-10-CM | POA: Diagnosis not present

## 2018-01-21 DIAGNOSIS — K449 Diaphragmatic hernia without obstruction or gangrene: Secondary | ICD-10-CM | POA: Diagnosis present

## 2018-01-21 DIAGNOSIS — K644 Residual hemorrhoidal skin tags: Secondary | ICD-10-CM | POA: Diagnosis present

## 2018-01-21 DIAGNOSIS — K317 Polyp of stomach and duodenum: Secondary | ICD-10-CM | POA: Diagnosis not present

## 2018-01-21 DIAGNOSIS — Z6838 Body mass index (BMI) 38.0-38.9, adult: Secondary | ICD-10-CM | POA: Diagnosis not present

## 2018-01-21 DIAGNOSIS — D509 Iron deficiency anemia, unspecified: Secondary | ICD-10-CM | POA: Diagnosis not present

## 2018-01-21 DIAGNOSIS — D62 Acute posthemorrhagic anemia: Secondary | ICD-10-CM | POA: Diagnosis not present

## 2018-01-21 DIAGNOSIS — K922 Gastrointestinal hemorrhage, unspecified: Secondary | ICD-10-CM | POA: Diagnosis present

## 2018-01-21 DIAGNOSIS — Z9841 Cataract extraction status, right eye: Secondary | ICD-10-CM | POA: Diagnosis not present

## 2018-01-21 DIAGNOSIS — K921 Melena: Secondary | ICD-10-CM | POA: Diagnosis not present

## 2018-01-21 DIAGNOSIS — M1990 Unspecified osteoarthritis, unspecified site: Secondary | ICD-10-CM | POA: Diagnosis present

## 2018-01-21 DIAGNOSIS — K648 Other hemorrhoids: Secondary | ICD-10-CM | POA: Diagnosis present

## 2018-01-21 DIAGNOSIS — Z9842 Cataract extraction status, left eye: Secondary | ICD-10-CM | POA: Diagnosis not present

## 2018-01-21 DIAGNOSIS — R5383 Other fatigue: Secondary | ICD-10-CM | POA: Diagnosis not present

## 2018-01-21 DIAGNOSIS — Z66 Do not resuscitate: Secondary | ICD-10-CM | POA: Diagnosis present

## 2018-01-21 DIAGNOSIS — K5750 Diverticulosis of both small and large intestine without perforation or abscess without bleeding: Secondary | ICD-10-CM | POA: Diagnosis not present

## 2018-01-21 DIAGNOSIS — Z9049 Acquired absence of other specified parts of digestive tract: Secondary | ICD-10-CM

## 2018-01-21 DIAGNOSIS — R195 Other fecal abnormalities: Secondary | ICD-10-CM | POA: Diagnosis not present

## 2018-01-21 DIAGNOSIS — I82509 Chronic embolism and thrombosis of unspecified deep veins of unspecified lower extremity: Secondary | ICD-10-CM | POA: Diagnosis not present

## 2018-01-21 DIAGNOSIS — K5710 Diverticulosis of small intestine without perforation or abscess without bleeding: Secondary | ICD-10-CM | POA: Diagnosis not present

## 2018-01-21 LAB — COMPREHENSIVE METABOLIC PANEL
ALBUMIN: 3.7 g/dL (ref 3.5–5.0)
ALT: 13 U/L (ref 0–44)
AST: 23 U/L (ref 15–41)
Alkaline Phosphatase: 60 U/L (ref 38–126)
Anion gap: 13 (ref 5–15)
BUN: 24 mg/dL — ABNORMAL HIGH (ref 8–23)
CHLORIDE: 108 mmol/L (ref 98–111)
CO2: 22 mmol/L (ref 22–32)
Calcium: 9.5 mg/dL (ref 8.9–10.3)
Creatinine, Ser: 0.87 mg/dL (ref 0.44–1.00)
GFR calc Af Amer: >60 mL/min (ref >60)
Glucose, Bld: 135 mg/dL — ABNORMAL HIGH (ref 70–99)
POTASSIUM: 3.9 mmol/L (ref 3.5–5.1)
SODIUM: 143 mmol/L (ref 135–145)
Total Bilirubin: 0.6 mg/dL (ref 0.3–1.2)
Total Protein: 8 g/dL (ref 6.5–8.1)

## 2018-01-21 LAB — CBC
HCT: 27.2 % — ABNORMAL LOW (ref 36.0–46.0)
HEMATOCRIT: 28.2 % — AB (ref 36.0–46.0)
Hemoglobin: 8.2 g/dL — ABNORMAL LOW (ref 12.0–15.0)
Hemoglobin: 7.9 g/dL — ABNORMAL LOW (ref 12.0–15.0)
MCH: 20.3 pg — AB (ref 26.0–34.0)
MCH: 20.3 pg — AB (ref 26.0–34.0)
MCHC: 29.1 g/dL — ABNORMAL LOW (ref 30.0–36.0)
MCHC: 29 g/dL — AB (ref 30.0–36.0)
MCV: 69.8 fL — AB (ref 78.0–100.0)
MCV: 69.7 fL — ABNORMAL LOW (ref 78.0–100.0)
PLATELETS: 432 10*3/uL — AB (ref 150–400)
Platelets: 459 10*3/uL — ABNORMAL HIGH (ref 150–400)
RBC: 4.04 MIL/uL (ref 3.87–5.11)
RBC: 3.9 MIL/uL (ref 3.87–5.11)
RDW: 16.8 % — ABNORMAL HIGH (ref 11.5–15.5)
RDW: 16.7 % — AB (ref 11.5–15.5)
WBC: 10.2 10*3/uL (ref 4.0–10.5)
WBC: 10.6 10*3/uL — ABNORMAL HIGH (ref 4.0–10.5)

## 2018-01-21 LAB — ABO/RH: ABO/RH(D): A POS

## 2018-01-21 LAB — POC OCCULT BLOOD, ED: Fecal Occult Bld: NEGATIVE

## 2018-01-21 MED ORDER — PANTOPRAZOLE SODIUM 40 MG IV SOLR: 40.0000 mg | Freq: Two times a day (BID) | INTRAVENOUS | Status: DC

## 2018-01-21 MED ORDER — SODIUM CHLORIDE 0.9 % IV SOLN
Freq: Once | INTRAVENOUS | Status: AC
  Administered 2018-01-21: 14:00:00 via INTRAVENOUS

## 2018-01-21 MED ORDER — SODIUM CHLORIDE 0.9 % IV SOLN
INTRAVENOUS | Status: DC
  Administered 2018-01-22: via INTRAVENOUS

## 2018-01-21 MED ORDER — SODIUM CHLORIDE 0.9 % IV SOLN
8.0000 mg/h | INTRAVENOUS | Status: DC
  Administered 2018-01-21 – 2018-01-22 (×2): 8 mg/h via INTRAVENOUS
  Filled 2018-01-21 (×3): qty 80

## 2018-01-21 MED ORDER — PANTOPRAZOLE SODIUM 40 MG IV SOLR
40.0000 mg | Freq: Once | INTRAVENOUS | Status: DC
  Filled 2018-01-21: qty 40

## 2018-01-21 NOTE — Consult Note (Signed)
Reason for Consult:guaiac positive microcytic anemia Referring Physician: hospital team  Lisa Owens is an 77 y.o. female.  HPI: patient seen and examined and discussed with the ER physician and her hospital computer chart and our office computer chart was reviewed and I could not find a CBC prior to 2014 and she does not have any GI symptomsand did have a negative cologuard test last year and her family history is negative from a GI standpoint but she's not had any other GI tests and she has been on blood thinner for DVT since Julyut no extra aspirin or nonsteroidals and this is her second DVT and she had no trouble with her blood thinner last year but was only on it 3 months and has actually been gaining weight and has no other complaints  Past Medical History:  Diagnosis Date  . Arthritis   . DVT (deep venous thrombosis) ([place])   . Headache(784.0)    sinus    Past Surgical History:  Procedure Laterality Date  . BUNIONECTOMY  2001   along with hammer toe  . CHOLECYSTECTOMY  2007  . EYE SURGERY  2011   bilateral cataract  . [place]  2001  . SHOULDER OPEN ROTATOR CUFF REPAIR  09/19/2011   Procedure: ROTATOR CUFF REPAIR SHOULDER OPEN;  Surgeon: Johnn Hai, MD;  Location: [facility];  Service: Orthopedics;  Laterality: Right;  . SUBACROMIAL DECOMPRESSION  09/19/2011   Procedure: SUBACROMIAL DECOMPRESSION;  Surgeon: Johnn Hai, MD;  Location: [facility];  Service: Orthopedics;  Laterality: Right;  . tummy tuck  2006  . UMBILICAL HERNIA REPAIR  2007   along with gallbladder    History reviewed. No pertinent family history.  Social History:  reports that she has never smoked. She has never used smokeless tobacco. She reports that she drinks about 1.0 standard drinks of alcohol per week. She reports that she does not use drugs.  Allergies: No Known Allergies  Medications: I have reviewed the patient's current medications.  Results for orders placed or performed during the  hospital encounter of 01/21/18 (from the past 48 hour(s))  Comprehensive metabolic panel     Status: Abnormal   Collection Time: 01/21/18 11:34 AM  Result Value Ref Range   Sodium 143 135 - 145 mmol/L   Potassium 3.9 3.5 - 5.1 mmol/L   Chloride 108 98 - 111 mmol/L   CO2 22 22 - 32 mmol/L   Glucose, Bld 135 (H) 70 - 99 mg/dL   BUN 24 (H) 8 - 23 mg/dL   Creatinine, Ser 0.87 0.44 - 1.00 mg/dL   Calcium 9.5 8.9 - 10.3 mg/dL   Total Protein 8.0 6.5 - 8.1 g/dL   Albumin 3.7 3.5 - 5.0 g/dL   AST 23 15 - 41 U/L   ALT 13 0 - 44 U/L   Alkaline Phosphatase 60 38 - 126 U/L   Total Bilirubin 0.6 0.3 - 1.2 mg/dL   GFR calc non Af Amer >60 >60 mL/min   GFR calc Af Amer >60 >60 mL/min    Comment: (NOTE) The eGFR has been calculated using the CKD EPI equation. This calculation has not been validated in all clinical situations. eGFR's persistently <60 mL/min signify possible Chronic Kidney Disease.    Anion gap 13 5 - 15    Comment: Performed at [facility], [place] [address], [place], [place] [address]  CBC     Status: Abnormal   Collection Time: 01/21/18 11:34 AM  Result  Value Ref Range   WBC 10.2 4.0 - 10.5 K/uL   RBC 4.04 3.87 - 5.11 MIL/uL   Hemoglobin 8.2 (L) 12.0 - 15.0 g/dL   HCT 28.2 (L) 36.0 - 46.0 %   MCV 69.8 (L) 78.0 - 100.0 fL   MCH 20.3 (L) 26.0 - 34.0 pg   MCHC 29.1 (L) 30.0 - 36.0 g/dL   RDW 16.8 (H) 11.5 - 15.5 %   Platelets 459 (H) 150 - 400 K/uL    Comment: Performed at [facility], [place] [address] [place], [place] [address]  Type and screen [place]     Status: None   Collection Time: 01/21/18 11:34 AM  Result Value Ref Range   ABO/RH(D) A POS    Antibody Screen NEG    Sample Expiration      01/24/2018 Performed at [facility], [place] [address] [place], [place] [address]   POC occult blood, ED     Status: None   Collection Time: 01/21/18  1:02 PM  Result Value Ref Range   Fecal Occult  Bld NEGATIVE NEGATIVE    No results found.  ROSnegative except above except for chronic back pain Blood pressure 121/69, pulse 82, temperature 98.2 F (36.8 C), temperature source Oral, resp. rate (!) 21, height '5\' 3"'$  (1.6 m), weight 97.5 kg, SpO2 99 %. Physical Examvital signs stable afebrile no acute distress lungs are clear regular rate and rhythm abdomen is soft nontender no pedal edema todayemoglobin 8 MCV 69.8 BUN 24  Assessment/Plan: Microcytic anemia guaiac positivity Plan: The risks benefits methods of endoscopy was discussed and will proceed tomorrow and pending those findings will decide on the timing ofcolonoscopy and also pending those findings can decide whether we should restart her blood thinner or change it or place a filter and she is not aware if they've ever done her clotting factor profile which might be helpful to decide if she truly needs lifelong anticoagulation and will allow clear liquids todaybut nothing by mouth after midnight  Takeo Harts E 01/21/2018, 3:58 PM

## 2018-01-21 NOTE — ED Notes (Signed)
Spoke to [place], MD. MD will triage patient to a tele floor instead of ICU step down.

## 2018-01-21 NOTE — ED Notes (Signed)
Ambulated to the restroom with assistance. Patient tolerated the walk to the restroom well.

## 2018-01-21 NOTE — ED Notes (Signed)
Orthostatics are documented.

## 2018-01-21 NOTE — ED Triage Notes (Signed)
Pt sent from PCP. Hgb at PCP office was ~8. Pt is paler than normal. Pt states stool has been "darker". Pt reports general fatigue.

## 2018-01-21 NOTE — ED Notes (Signed)
Report given to 5-E. Transport called.

## 2018-01-21 NOTE — ED Provider Notes (Signed)
[facility] DEPT Provider Note   CSN: [id number] Arrival date & time: 01/21/18  1115     History   Chief Complaint Chief Complaint  Patient presents with  . Abnormal Lab    HPI Lisa Owens is a 77 y.o. female chronic back pain, DVT diagnosed July 2019 on [place] who presented to the ED today after being sent by her PCP for an abnormal lab value. Patient reports that for the past 2-3 weeks her stools have been dark/black in color. She has been feeling very fatigued, weak, increased shortness of breath with exertion. She followed up with her primary care doctor today and had labs drawn and was noted to have a hemoglobin of 8.2 so she was encouraged to come to the ED for further evaluation. She denies any prior history of GI bleeding, dysphagia, GERD, weight loss, abdominal pain. However she does feel more bloated than normal with early satiety. She has never had a prior EGD or colonoscopy. She did have a cold guard test that was negative. She denies any NSAID use. Her last dose at the request was this morning.  HPI  Past Medical History:  Diagnosis Date  . Arthritis   . DVT (deep venous thrombosis) ([place])   . Headache(784.0)    sinus    There are no active problems to display for this patient.   Past Surgical History:  Procedure Laterality Date  . BUNIONECTOMY  2001   along with hammer toe  . CHOLECYSTECTOMY  2007  . EYE SURGERY  2011   bilateral cataract  . [place]  2001  . SHOULDER OPEN ROTATOR CUFF REPAIR  09/19/2011   Procedure: ROTATOR CUFF REPAIR SHOULDER OPEN;  Surgeon: Johnn Hai, MD;  Location: [facility];  Service: Orthopedics;  Laterality: Right;  . SUBACROMIAL DECOMPRESSION  09/19/2011   Procedure: SUBACROMIAL DECOMPRESSION;  Surgeon: Johnn Hai, MD;  Location: [facility];  Service: Orthopedics;  Laterality: Right;  . tummy tuck  2006  . UMBILICAL HERNIA REPAIR  2007   along with gallbladder     OB History    None      Home Medications    Prior to Admission medications   Medication Sig Start Date End Date Taking? Authorizing Provider  ELIQUIS 5 MG TABS tablet Take 5 mg by mouth 2 (two) times daily. 12/26/17  Yes [provider]  Menthol, Topical Analgesic, (BLUE-EMU MAXIMUM STRENGTH) 2.5 % LIQD Apply 1 application topically 2 (two) times daily as needed (back pain).   Yes [provider]    Family History No family history on file.  Social History Social History   Tobacco Use  . Smoking status: Never Smoker  . Smokeless tobacco: Never Used  Substance Use Topics  . Alcohol use: Yes    Alcohol/week: 1.0 standard drinks    Types: 1 Glasses of wine per week    Comment: socially  . Drug use: No     Allergies   Patient has no known allergies.   Review of Systems Review of Systems  All other systems reviewed and are negative.    Physical Exam Updated Vital Signs BP 130/69 (BP Location: Left Arm)   Pulse 88   Temp 98.2 F (36.8 C) (Oral)   Resp 20   Ht 5\' 3"  (1.6 m)   Wt 97.5 kg   SpO2 97%   BMI 38.09 kg/m   Physical Exam  Constitutional: She is oriented to person, place, and time.  No distress.  Elderly, pale  HENT:  Head: Normocephalic and atraumatic.  Mouth/Throat: No oropharyngeal exudate.  Eyes: Pupils are equal, round, and reactive to light. Conjunctivae and EOM are normal. Right eye exhibits no discharge. Left eye exhibits no discharge. No scleral icterus.  Cardiovascular: Normal rate, regular rhythm, normal heart sounds and intact distal pulses. Exam reveals no gallop and no friction rub.  No murmur heard. Pulmonary/Chest: Effort normal and breath sounds normal. No respiratory distress. She has no wheezes. She has no rales. She exhibits no tenderness.  Abdominal: Soft. Bowel sounds are normal. She exhibits no distension. There is no tenderness. There is no guarding.  Genitourinary:  Genitourinary Comments: No stool in rectal vault. Small,  non-thrombosed external hemorrhoids  Musculoskeletal: Normal range of motion. She exhibits no edema.  Neurological: She is alert and oriented to person, place, and time.  Skin: Skin is warm and dry. No rash noted. She is not diaphoretic. No erythema. No pallor.  Psychiatric: She has a normal mood and affect. Her behavior is normal.  Nursing note and vitals reviewed.    ED Treatments / Results  Labs (all labs ordered are listed, but only abnormal results are displayed) Labs Reviewed  COMPREHENSIVE METABOLIC PANEL - Abnormal; Notable for the following components:      Result Value   Glucose, Bld 135 (*)    BUN 24 (*)    All other components within normal limits  CBC - Abnormal; Notable for the following components:   Hemoglobin 8.2 (*)    HCT 28.2 (*)    MCV 69.8 (*)    MCH 20.3 (*)    MCHC 29.1 (*)    RDW 16.8 (*)    Platelets 459 (*)    All other components within normal limits  POC OCCULT BLOOD, ED  TYPE AND SCREEN  ABO/RH    EKG None  Radiology No results found.  Procedures Procedures (including critical care time)  Medications Ordered in ED Medications  pantoprazole (PROTONIX) injection 40 mg (has no administration in time range)     Initial Impression / Assessment and Plan / ED Course  I have reviewed the triage vital signs and the nursing notes.  Pertinent labs & imaging results that were available during my care of the patient were reviewed by me and considered in my medical decision making (see chart for details).    77 year old female recently started on a liquid in July 2019 for an acute DVT presented to the ED today with symptomatic anemia, melena for 2 weeks. Hemoglobin low at 8.2. She appears very pale on exam. Her vitals are stable. On rectal exam she did not have any stool in the rectal vault, mucous was tested which was heme-negative although this may be inaccurate as again there was no stool in her rectal vault. Her B when is slightly elevated at  24. I am concerned for a possible upper GI bleed especially in the setting of eliquis. She's been given a dose of Protonix 40 mg IV. We'll type and screen her but hold off on transfusion right now as her hemoglobin is above 8 currently. Plan to consult hospitalist for admission.  2:40 PM Heard back from [place], will need to call [facility] given that pt PCP is with [facility]. Will call [facility] for consultation.  3:07 PM Spoke with [facility], plan for EGD tomorrow he will consult on pt once admitted. NPO after midnight and hold the Eliquis.   Final Clinical Impressions(s) /  ED Diagnoses   Final diagnoses:  None    ED Discharge Orders    None       Dowless, Dondra Spry, PA-C 01/21/18 1508    Sherwood Gambler, MD 01/22/18 1025

## 2018-01-21 NOTE — H&P (Signed)
HPI  Lisa Owens EGB:[id number] DOB: [birth year] DOA: 01/21/2018  PCP: Dineen Kid, MD   Chief Complaint: 77 year old female  HPI:  77 year old female, left common femoral vein DVT 2018, recurrent DVT in July 2019-no records-ultrasound performed showed posterior tibial and peroneal veins from knee to ankle with great saphenous vein being included with the DVC?, chronic low back pain status, chronic knee pain status post PRP injections-did not have surgery as was planned rotator cuff repair and subacromial decompression 09/2011  Presented to PCP office with 3 to 4 weeks of overall feeling weak States that this started when she was off at a high school reunion about a month ago Ever since then she has had some dizziness, been unable to really ambulate whereas she is completely functional at baseline-has had some "tiredness" and has noticed that her stool has been darker than usual Color and consistency of stool is like coffee grounds although interestingly her stool is formed and like a log States that she has been eating fairly well without vomiting or chest pain Does not take Goody powders or OTC nonsteroidals drinks socially does not smoke  ED Course: Patient given Protonix, kept n.p.o., typed and screened, GI consulted and labs performed as below  Review of Systems:   Negative for fever, chills,, sore throat, rash, new muscle aches, chest pain, SOB, dysuria, bleeding, n/v/abdominal pain.  Past Medical History:  Diagnosis Date  . Arthritis   . DVT (deep venous thrombosis) ([place])   . Headache(784.0)    sinus    Past Surgical History:  Procedure Laterality Date  . BUNIONECTOMY  2001   along with hammer toe  . CHOLECYSTECTOMY  2007  . EYE SURGERY  2011   bilateral cataract  . [place]  2001  . SHOULDER OPEN ROTATOR CUFF REPAIR  09/19/2011   Procedure: ROTATOR CUFF REPAIR SHOULDER OPEN;  Surgeon: Johnn Hai, MD;  Location: [facility];  Service: Orthopedics;   Laterality: Right;  . SUBACROMIAL DECOMPRESSION  09/19/2011   Procedure: SUBACROMIAL DECOMPRESSION;  Surgeon: Johnn Hai, MD;  Location: [facility];  Service: Orthopedics;  Laterality: Right;  . tummy tuck  2006  . UMBILICAL HERNIA REPAIR  2007   along with gallbladder     reports that she has never smoked. She has never used smokeless tobacco. She reports that she drinks about 1.0 standard drinks of alcohol per week. She reports that she does not use drugs. Mobility: Independent at baseline drives around very highly functional  No Known Allergies None No family history on file. Father had rheumatism and died at age 21 with a blood clot History was unclear  Prior to Admission medications   Medication Sig Start Date End Date Taking? Authorizing Provider  ELIQUIS 5 MG TABS tablet Take 5 mg by mouth 2 (two) times daily. 12/26/17  Yes [provider]  Menthol, Topical Analgesic, (BLUE-EMU MAXIMUM STRENGTH) 2.5 % LIQD Apply 1 application topically 2 (two) times daily as needed (back pain).   Yes [provider]    Physical Exam:  Vitals:   01/21/18 1231 01/21/18 1330  BP:  103/84  Pulse: 88 85  Resp: 20 20  Temp:    SpO2: 97% 95%     Awake alert pleasant no distress significantly pale  [place] Patti 2  No JVD, no bruit  Holosystolic murmur best heard right upper sternal edge second intercostal space  Chest clinically clear no added sound  Abdomen soft no epigastric tenderness no hepatosplenomegaly no suprapubic  tenderness  Range of motion intact lower extremities joints not swollen abdomen obese  I have personally reviewed following labs and imaging studies  Labs:   Hemoglobin 8.2 platelet 459 MCV 69  BUN/creatinine 24/0.8 LFTs normal  Imaging studies:   none   Medical tests:   EKG independently reviewed: no    Test discussed with performing physician:  no   Decision to obtain old records:   Y   Review and summation of old records:     no   Active Problems:   Melena   Assessment/Plan Melena is likely secondary to being on Eliquis Morbid obesity BMI 38 Multiple orthopedic problems DVT recurrence-possible candidate for filter?  Probable upper GI bleed-historically suggests possible upper GI bleed however this could be a slow lower abdominal bleed as well-she states that her stool is formed so it is probably a slow bleed-she does not take any Goody powders and does not have a significantly high risk at this juncture and this seems more subacute-nevertheless because she is orthostatic and dizzy on standing and moving around to some degree we will place her in stepdown, start IV saline, keep 2 large-bore IV, change Protonix from twice daily to drip and await GI input in terms of endoscopy and colonoscopy as she is never had either  Recurrent DVTs 2000 18/2019 July-I would not classify this as a treatment failure of her Eliquis however this will need to be readdressed later in the hospitalization-her husband is on Coumadin so it may be reasonable to have a discussion with this lady regarding IVC filter versus alternate NOAC-defer timing of the same to gastroenterologist when and if procedure is done Note that she may need to be on IV heparin to allow for titration of the same  Morbid obesity-patient has gained weight since having her back spasms and had a fall recently on her left knee she is less mobile-hopefully she can mobilize a little more once her acute issues resolved and she can ambulate ad lib. hair as long she is not dizzy  Mild acute kidney injury secondary to azotemia from GI bleed-start on IV fluids-monitor and orthostatics check every shift      Severity of Illness: The appropriate patient status for this patient is INPATIENT. Inpatient status is judged to be reasonable and necessary in order to provide the required intensity of service to ensure the patient's safety. The patient's presenting symptoms,  physical exam findings, and initial radiographic and laboratory data in the context of their chronic comorbidities is felt to place them at high risk for further clinical deterioration. Furthermore, it is not anticipated that the patient will be medically stable for discharge from the hospital within 2 midnights of admission. The following factors support the patient status of inpatient.   " The patient's presenting symptoms include dark stool tarry stool dizziness. " The worrisome physical exam findings include pallor. " The initial radiographic and laboratory data are worrisome because of GI bleed. " The chronic co-morbidities include none.   * I certify that at the point of admission it is my clinical judgment that the patient will require inpatient hospital care spanning beyond 2 midnights from the point of admission due to high intensity of service, high risk for further deterioration and high frequency of surveillance required.*      SCDs F2 DNR confirmed at the bedside no family gastroenterology was consulted by the emergency room   Time spent: 73 minutes  Verlon Au, MD  [facility] Direct contact: [phone] --Via  amion app OR  --[email or web address]; password [email or web address]  7PM-7AM contact night coverage as above  01/21/2018, 1:58 PM

## 2018-01-21 NOTE — ED Notes (Signed)
  Spoke to ICU charge nurse. They would like to change patients bed status to med surge. Nurse from [facility] will call back in 5 min.

## 2018-01-21 NOTE — ED Notes (Signed)
Dr. Regenia Skeeter came into room to speak with patient while attempting to get patient ready for a walk to the restroom. As he was in there, the hospitalist came into the room. After he has completed his assessment, prescribed medication will be given and time for patient to the restroom.

## 2018-01-22 ENCOUNTER — Encounter (HOSPITAL_COMMUNITY): Payer: Self-pay

## 2018-01-22 ENCOUNTER — Encounter (HOSPITAL_COMMUNITY)
Admission: EM | Disposition: A | Discharge status: Home / Self Care | Payer: Self-pay | Source: Home / Self Care | Attending: Family Medicine

## 2018-01-22 ENCOUNTER — Inpatient Hospital Stay (HOSPITAL_COMMUNITY): Payer: Medicare HMO | Admitting: Anesthesiology

## 2018-01-22 DIAGNOSIS — D649 Anemia, unspecified: Secondary | ICD-10-CM

## 2018-01-22 DIAGNOSIS — I82509 Chronic embolism and thrombosis of unspecified deep veins of unspecified lower extremity: Secondary | ICD-10-CM

## 2018-01-22 HISTORY — PX: ESOPHAGOGASTRODUODENOSCOPY (EGD) WITH PROPOFOL: SHX5813

## 2018-01-22 LAB — CBC
HEMATOCRIT: 23.8 % — AB (ref 36.0–46.0)
Hemoglobin: 7 g/dL — ABNORMAL LOW (ref 12.0–15.0)
MCH: 20.5 pg — ABNORMAL LOW (ref 26.0–34.0)
MCHC: 29.4 g/dL — ABNORMAL LOW (ref 30.0–36.0)
MCV: 69.8 fL — ABNORMAL LOW (ref 78.0–100.0)
PLATELETS: 381 10*3/uL (ref 150–400)
RBC: 3.41 MIL/uL — ABNORMAL LOW (ref 3.87–5.11)
RDW: 16.9 % — ABNORMAL HIGH (ref 11.5–15.5)
WBC: 8 10*3/uL (ref 4.0–10.5)

## 2018-01-22 LAB — BASIC METABOLIC PANEL
ANION GAP: 8 (ref 5–15)
BUN: 16 mg/dL (ref 8–23)
CHLORIDE: 109 mmol/L (ref 98–111)
CO2: 24 mmol/L (ref 22–32)
Calcium: 8.8 mg/dL — ABNORMAL LOW (ref 8.9–10.3)
Creatinine, Ser: 0.64 mg/dL (ref 0.44–1.00)
GFR calc Af Amer: >60 mL/min (ref >60)
GLUCOSE: 93 mg/dL (ref 70–99)
Potassium: 4.1 mmol/L (ref 3.5–5.1)
Sodium: 141 mmol/L (ref 135–145)

## 2018-01-22 LAB — HEMOGLOBIN AND HEMATOCRIT, BLOOD
HCT: 28.7 % — ABNORMAL LOW (ref 36.0–46.0)
HEMOGLOBIN: 8.5 g/dL — AB (ref 12.0–15.0)

## 2018-01-22 LAB — PREPARE RBC (CROSSMATCH)

## 2018-01-22 SURGERY — ESOPHAGOGASTRODUODENOSCOPY (EGD) WITH PROPOFOL
Anesthesia: Monitor Anesthesia Care

## 2018-01-22 MED ORDER — SODIUM CHLORIDE 0.9 % IV SOLN
INTRAVENOUS | Status: DC
  Administered 2018-01-22: 17:00:00 via INTRAVENOUS
  Administered 2018-01-23: 500 mL via INTRAVENOUS

## 2018-01-22 MED ORDER — PROPOFOL 10 MG/ML IV BOLUS
INTRAVENOUS | Status: DC | PRN
  Administered 2018-01-22 (×3): 20 mg via INTRAVENOUS

## 2018-01-22 MED ORDER — POLYETHYLENE GLYCOL 3350 17 GM/SCOOP PO POWD
255.0000 g | Freq: Once | ORAL | Status: AC
  Administered 2018-01-22: 255 g via ORAL
  Filled 2018-01-22: qty 255

## 2018-01-22 MED ORDER — MAGNESIUM CITRATE PO SOLN
1.0000 | Freq: Once | ORAL | Status: AC
  Administered 2018-01-22: 1 via ORAL
  Filled 2018-01-22: qty 296

## 2018-01-22 MED ORDER — PROPOFOL 10 MG/ML IV BOLUS
INTRAVENOUS | Status: AC
  Filled 2018-01-22: qty 60

## 2018-01-22 MED ORDER — PROPOFOL 500 MG/50ML IV EMUL
INTRAVENOUS | Status: DC | PRN
  Administered 2018-01-22: 100 ug/kg/min via INTRAVENOUS

## 2018-01-22 MED ORDER — SODIUM CHLORIDE 0.9 % IV SOLN
INTRAVENOUS | Status: DC
  Administered 2018-01-22: 11:00:00 via INTRAVENOUS

## 2018-01-22 MED ORDER — PANTOPRAZOLE SODIUM 40 MG PO TBEC
40.0000 mg | DELAYED_RELEASE_TABLET | Freq: Every day | ORAL | Status: DC
  Filled 2018-01-22: qty 1

## 2018-01-22 SURGICAL SUPPLY — 14 items

## 2018-01-22 NOTE — Op Note (Signed)
[facility] Patient Name: Lisa Owens Procedure Date: 01/22/2018 MRN: [id number] Attending MD: Clarene Essex , MD Date of Birth: 04/14/1941 CSN: [id number] Age: 77 Admit Type: Outpatient Procedure:                Upper GI endoscopy Indications:              Iron deficiency anemia secondary to chronic blood                            loss Providers:                Clarene Essex, MD, Carolynn Comment RN, RN, Elspeth Cho Tech., Technician, Derrek Gu. Alday CRNA,                            CRNA Referring MD:              Medicines:                Propofol total dose 846 mg IV Complications:            No immediate complications. Estimated Blood Loss:     Estimated blood loss: none. Procedure:                Pre-Anesthesia Assessment:                           - Prior to the procedure, a History and Physical                            was performed, and patient medications and                            allergies were reviewed. The patient's tolerance of                            previous anesthesia was also reviewed. The risks                            and benefits of the procedure and the sedation                            options and risks were discussed with the patient.                            All questions were answered, and informed consent                            was obtained. Prior Anticoagulants: The patient has                            taken Eliquis (apixaban), last dose was 1 day prior  to procedure. ASA Grade Assessment: II - A patient                            with mild systemic disease. After reviewing the                            risks and benefits, the patient was deemed in                            satisfactory condition to undergo the procedure.                           After obtaining informed consent, the endoscope was                            passed under direct vision. Throughout  the                            procedure, the patient's blood pressure, pulse, and                            oxygen saturations were monitored continuously. The                            GIF-H190 ([id number] Olympus adult endoscope was                            introduced through the mouth, and advanced to the                            third part of duodenum. The upper GI endoscopy was                            accomplished without difficulty. The patient                            tolerated the procedure well. Scope In: Scope Out: Findings:      The larynx was normal.      A medium-sized hiatal hernia was present. Her GE junction was at 35 cm       and she had a significant J-shaped stomach with minimal scope trauma       while passing the scope into the duodenum      The entire examined stomach was normal except for tiny polyp below.      A medium non-bleeding diverticulum was found in the second portion of       the duodenum.      The duodenal bulb, first portion of the duodenum and third portion of       the duodenum were normal.      The exam was otherwise without abnormality.      A single diminutive semi-sessile polyp with no bleeding and no stigmata       of recent bleeding was found in the gastric fundus. Impression:               - Normal  larynx.                           - Medium-sized hiatal hernia.                           - Normal stomach.                           - Non-bleeding duodenal diverticulum.                           - Normal duodenal bulb, first portion of the                            duodenum and third portion of the duodenum.                           - The examination was otherwise normal.                           - A single gastric polyp.                           - No specimens collected. Moderate Sedation:      N/A- Per Anesthesia Care Recommendation:           - Patient has a contact number available for                             emergencies. The signs and symptoms of potential                            delayed complications were discussed with the                            patient. Return to normal activities tomorrow.                            Written discharge instructions were provided to the                            patient.                           - Clear liquid diet today.                           - Continue present medications.                           - Return to GI clinic PRN.                           - Telephone GI clinic if symptomatic PRN.                           - Perform a colonoscopy  tomorrow. Procedure Code(s):        --- Professional ---                           [phone], Esophagogastroduodenoscopy, flexible,                            transoral; diagnostic, including collection of                            specimen(s) by brushing or washing, when performed                            (separate procedure) Diagnosis Code(s):        --- Professional ---                           K44.9, Diaphragmatic hernia without obstruction or                            gangrene                           D50.0, Iron deficiency anemia secondary to blood                            loss (chronic)                           K57.10, Diverticulosis of small intestine without                            perforation or abscess without bleeding CPT copyright 2017 American Medical Association. All rights reserved. The codes documented in this report are preliminary and upon coder review may  be revised to meet current compliance requirements. Clarene Essex, MD 01/22/2018 9:53:58 AM This report has been signed electronically. Number of Addenda: 0

## 2018-01-22 NOTE — Progress Notes (Signed)
Paged MD at 1338. RN will monitor patient, waiting for order and recheck BP in 30 minutes.

## 2018-01-22 NOTE — Progress Notes (Signed)
Notified MD. MD assessed patient. RN will monitor patient as ordered.

## 2018-01-22 NOTE — Transfer of Care (Signed)
Immediate Anesthesia Transfer of Care Note  Patient: Lisa Owens  Procedure(s) Performed: ESOPHAGOGASTRODUODENOSCOPY (EGD) WITH PROPOFOL (N/A )  Patient Location: PACU  Anesthesia Type:MAC  Level of Consciousness: sedated  Airway & Oxygen Therapy: Patient Spontanous Breathing and Patient connected to nasal cannula oxygen  Post-op Assessment: Report given to RN and Post -op Vital signs reviewed and stable  Post vital signs: Reviewed and stable  Last Vitals:  Vitals Value Taken Time  BP    Temp    Pulse 82 01/22/2018  9:47 AM  Resp 20 01/22/2018  9:47 AM  SpO2 88 % 01/22/2018  9:47 AM  Vitals shown include unvalidated device data.  Last Pain:  Vitals:   01/22/18 0841  TempSrc: Oral  PainSc: 0-No pain         Complications: No apparent anesthesia complications

## 2018-01-22 NOTE — Progress Notes (Signed)
Blood start time 1333.

## 2018-01-22 NOTE — Progress Notes (Addendum)
PROGRESS NOTE [facility]   [place]   HWE:[id number] DOB: 10/21/1940  DOA: 01/21/2018 PCP: Dineen Kid, MD   Brief Narrative:  Lisa Owens 77 year old female with medical history significant for recurrent DVTs, on Eliquis and chronic low back pain.  Patient was sent to the emergency department after being sent by PCP for abnormal lab value.  Low hemoglobin.  Patient report feeling fatigue, weak and shortness of breath with exertion.  Also report having dark/black stools for the past 2 to 3 weeks prior to admission.  Lab at PCP shows hemoglobin of 8.2 therefore she was sent to the ED for further evaluation.  Upon ED evaluation hemoglobin was found 7.9.  FOBT negative and patient was admitted with working diagnosis of possible GI bleed in setting of anticoagulation.  GI was consulted and underwent endoscopy.  Subjective: Patient seen and examined, status post EGD with no acute findings.  She is receiving 1 unit of PRBC as her hemoglobin this morning was 7.  She denies any further dark bowel movements.  No nausea or vomiting.  Has no complaints and feeling slight more energetic.  Assessment & Plan: Symptomatic Anemia Unclear etiology at this time, however suspected GI bleed in setting of Eliquis.  Unknown hemoglobin baseline as last hemoglobin report was 13.7 on 2013.  Differential diagnosis include iron deficiency anemia.  Hemoglobin upon admission 7.9 -> 7.  Will transfuse 1 unit of PRBC.  Check anemia panel.  Monitor CBC in a.m.  Continue GI work-up, EGD non-revealing, patient getting prep for colonoscopy tomorrow in AM.  Patient was placed on PPI drip, transitioning to oral Protonix however no significant findings to keep her on PPI therapy.  She would like to be off this medication if not really needed.  Discontinue Protonix.  Recurrent DVT Prior to admission on Eliquis, EGD not revealing any cause of bleeding, colonoscopy awaiting.  Continue to hold Eliquis and resume per GI  recommendations.  If unable to resume Eliquis may need to consider IVC filter.  Continue to monitor for now.  DVT prophylaxis: SCD's  Code Status: Full Code  Family Communication: Husband at bedside  Disposition Plan: Home when cleared by GI   Consultants:   GI - [facility]   Procedures:   EGD 01/22/18  Antimicrobials:  None     Objective: Vitals:   01/22/18 1010 01/22/18 1115 01/22/18 1314 01/22/18 1348  BP: (!) 143/63 122/60 (!) 150/71 (!) 147/82  Pulse: 74  94 92  Resp: 19   16  Temp:   98.1 F (36.7 C) 98.5 F (36.9 C)  TempSrc:   Oral Oral  SpO2: 96%  99% 99%  Weight:      Height:        Intake/Output Summary (Last 24 hours) at 01/22/2018 1536 Last data filed at 01/22/2018 1322 Gross per 24 hour  Intake 2492.29 ml  Output -  Net 2492.29 ml   Filed Weights   01/21/18 1126  Weight: 97.5 kg    Examination:  General exam: Appears calm and comfortable  HEENT OP moist and clear Respiratory system: Clear to auscultation. No wheezes,crackle or rhonchi Cardiovascular system: S1 & S2 heard, RRR. No JVD, murmurs, rubs or gallops Gastrointestinal system: Abdomen is nondistended, soft and nontender.  Central nervous system: Alert and oriented. No focal neurological deficits. Extremities: No pedal edema.  Skin: No rashes Psychiatry: Mood & affect appropriate.    Data Reviewed: I have personally reviewed following labs and imaging studies  CBC: Recent Labs  Lab  01/21/18 1134 01/21/18 1649 01/22/18 0522  WBC 10.2 10.6* 8.0  HGB 8.2* 7.9* 7.0*  HCT 28.2* 27.2* 23.8*  MCV 69.8* 69.7* 69.8*  PLT 459* 432* 301   Basic Metabolic Panel: Recent Labs  Lab 01/21/18 1134 01/22/18 0522  NA 143 141  K 3.9 4.1  CL 108 109  CO2 22 24  GLUCOSE 135* 93  BUN 24* 16  CREATININE 0.87 0.64  CALCIUM 9.5 8.8*   GFR: Estimated Creatinine Clearance: 64.4 mL/min (by C-G formula based on SCr of 0.64 mg/dL). Liver Function Tests: Recent Labs  Lab 01/21/18 1134  AST 23    ALT 13  ALKPHOS 60  BILITOT 0.6  PROT 8.0  ALBUMIN 3.7   No results for input(s): LIPASE, AMYLASE in the last 168 hours. No results for input(s): AMMONIA in the last 168 hours. Coagulation Profile: No results for input(s): INR, PROTIME in the last 168 hours. Cardiac Enzymes: No results for input(s): CKTOTAL, CKMB, CKMBINDEX, TROPONINI in the last 168 hours. BNP (last 3 results) No results for input(s): PROBNP in the last 8760 hours. HbA1C: No results for input(s): HGBA1C in the last 72 hours. CBG: No results for input(s): GLUCAP in the last 168 hours. Lipid Profile: No results for input(s): CHOL, HDL, LDLCALC, TRIG, CHOLHDL, LDLDIRECT in the last 72 hours. Thyroid Function Tests: No results for input(s): TSH, T4TOTAL, FREET4, T3FREE, THYROIDAB in the last 72 hours. Anemia Panel: No results for input(s): VITAMINB12, FOLATE, FERRITIN, TIBC, IRON, RETICCTPCT in the last 72 hours. Sepsis Labs: No results for input(s): PROCALCITON, LATICACIDVEN in the last 168 hours.  No results found for this or any previous visit (from the past 240 hour(s)).    Radiology Studies: No results found.    Scheduled Meds: Continuous Infusions: . sodium chloride 10 mL/hr at 01/22/18 1107  . sodium chloride       LOS: 1 day    Time spent: Total of 25 minutes spent with pt, greater than 50% of which was spent in discussion of  treatment, counseling and coordination of care    Chipper Oman, MD Pager: Text Page via [email or web address]   If 7PM-7AM, please contact night-coverage [email or web address] 01/22/2018, 3:36 PM   Note - This record has been created using [employer]. Chart creation errors have been sought, but may not always have been located. Such creation errors do not reflect on the standard of medical care.

## 2018-01-22 NOTE — Anesthesia Preprocedure Evaluation (Addendum)
Anesthesia Evaluation  Patient identified by MRN, date of birth, ID band Patient awake    Reviewed: Allergy & Precautions, NPO status , Patient's Chart, lab work & pertinent test results  History of Anesthesia Complications Negative for: history of anesthetic complications  Airway Mallampati: II  TM Distance: >3 FB Neck ROM: Full    Dental  (+) Teeth Intact   Pulmonary neg pulmonary ROS,    breath sounds clear to auscultation       Cardiovascular negative cardio ROS   Rhythm:Regular     Neuro/Psych  Headaches, negative psych ROS   GI/Hepatic negative GI ROS, Neg liver ROS,  gi bleed   Endo/Other  Morbid obesity  Renal/GU negative Renal ROS  negative genitourinary   Musculoskeletal  (+) Arthritis ,   Abdominal   Peds  Hematology  (+) anemia ,   Anesthesia Other Findings DVT right LE July  Reproductive/Obstetrics                            Anesthesia Physical Anesthesia Plan  ASA: II  Anesthesia Plan: MAC   Post-op Pain Management:    Induction: Intravenous  PONV Risk Score and Plan: 2 and Treatment may vary due to age or medical condition  Airway Management Planned: Nasal Cannula  Additional Equipment: None  Intra-op Plan:   Post-operative Plan:   Informed Consent: I have reviewed the patients History and Physical, chart, labs and discussed the procedure including the risks, benefits and alternatives for the proposed anesthesia with the patient or authorized representative who has indicated his/her understanding and acceptance.   Dental advisory given  Plan Discussed with: CRNA and Surgeon  Anesthesia Plan Comments:         Anesthesia Quick Evaluation

## 2018-01-22 NOTE — Anesthesia Postprocedure Evaluation (Signed)
Anesthesia Post Note  Patient: Lisa Owens  Procedure(s) Performed: ESOPHAGOGASTRODUODENOSCOPY (EGD) WITH PROPOFOL (N/A )     Patient location during evaluation: Endoscopy Anesthesia Type: MAC Level of consciousness: awake and alert Pain management: pain level controlled Vital Signs Assessment: post-procedure vital signs reviewed and stable Respiratory status: spontaneous breathing, nonlabored ventilation, respiratory function stable and patient connected to nasal cannula oxygen Cardiovascular status: stable and blood pressure returned to baseline Postop Assessment: no apparent nausea or vomiting Anesthetic complications: no    Last Vitals:  Vitals:   01/22/18 1000 01/22/18 1010  BP: (!) 121/55 (!) 143/63  Pulse: 78 74  Resp: 17 19  Temp:    SpO2: 98% 96%    Last Pain:  Vitals:   01/22/18 1010  TempSrc:   PainSc: 0-No pain                 Rusty Villella

## 2018-01-22 NOTE — Progress Notes (Signed)
Lisa Owens 9:27 AM  Subjective: patient with dark bowel movement but no other complaints since she's been here and we rediscussed possible workup  Objective: Vital signs stable afebrile exam please see preassessment evaluation hemoglobin and BUN slight drop  Assessment: Guaiac positive anemia in patient on blood thinner  Plan: Okay to proceed with endoscopy with anesthesia assistance with further workup and plans pending those findings  [facility]  Pager [phone] After 5PM or if no answer call [phone]

## 2018-01-22 NOTE — Care Management (Signed)
  Discharge Readiness Milestones  (1st Day of chart review)  MCG Used: upper gi bleed Optimal GLOS: 1-2 Hospital Day: 1  Discharge Readiness Milestones Goal Length of Stay: Ambulatory or 2 days  Note: Goal Length of Stay assumes optimal recovery, decision making, and care. Patients may be discharged to a lower level of care (either later than or sooner than the goal) when it is appropriate for their clinical status and care needs. Discharge Readiness Return to top of Gastrointestinal Bleeding, Upper RRG - ISC  Discharge readiness is indicated by patient meeting Recovery Milestones, including ALL of the following: ? Hypotension absent ? Surgical and other acute interventions  ? Bleeding  ? Stable Hgb/Hct hgb=7.0 rec'd one unit of bld ? Pain abdominal ? Ambulatory ? Oral hydration, medications, and diet ? Npo, iv ns at kvo ?   Identified Barriers: Date/Name case discussed with attending MD (if applicable):  Date/name case discussed with supervisor (if applicable):  Date/name case referred to physician advisor (if applicable)   Concurrent Review - Discharge Readiness Milestones, Not Met

## 2018-01-22 NOTE — Progress Notes (Signed)
Writer paged Dr. Hilbert Bible d/t pt hgb 7.0, hct 23.8. Pt had a BM last night, she states it was "darker" in color. No other signs of active bleeding. Pt due for Endoscopy this AM. Awaiting call back or new orders if any.

## 2018-01-23 ENCOUNTER — Encounter (HOSPITAL_COMMUNITY): Payer: Self-pay | Admitting: *Deleted

## 2018-01-23 ENCOUNTER — Encounter (HOSPITAL_COMMUNITY)
Admission: EM | Disposition: A | Discharge status: Home / Self Care | Payer: Self-pay | Source: Home / Self Care | Attending: Family Medicine

## 2018-01-23 DIAGNOSIS — K921 Melena: Principal | ICD-10-CM

## 2018-01-23 DIAGNOSIS — Z86718 Personal history of other venous thrombosis and embolism: Secondary | ICD-10-CM

## 2018-01-23 DIAGNOSIS — K922 Gastrointestinal hemorrhage, unspecified: Secondary | ICD-10-CM

## 2018-01-23 DIAGNOSIS — D62 Acute posthemorrhagic anemia: Secondary | ICD-10-CM

## 2018-01-23 DIAGNOSIS — D500 Iron deficiency anemia secondary to blood loss (chronic): Secondary | ICD-10-CM

## 2018-01-23 HISTORY — PX: COLONOSCOPY WITH PROPOFOL: SHX5780

## 2018-01-23 LAB — CBC
HCT: 27.4 % — ABNORMAL LOW (ref 36.0–46.0)
Hemoglobin: 8.1 g/dL — ABNORMAL LOW (ref 12.0–15.0)
MCH: 21 pg — AB (ref 26.0–34.0)
MCHC: 29.6 g/dL — AB (ref 30.0–36.0)
MCV: 71.2 fL — AB (ref 78.0–100.0)
Platelets: 366 10*3/uL (ref 150–400)
RBC: 3.85 MIL/uL — ABNORMAL LOW (ref 3.87–5.11)
RDW: 17.2 % — AB (ref 11.5–15.5)
WBC: 7.9 10*3/uL (ref 4.0–10.5)

## 2018-01-23 LAB — RETICULOCYTES
RBC.: 3.85 MIL/uL — AB (ref 3.87–5.11)
RETIC CT PCT: 1.5 % (ref 0.4–3.1)
Retic Count, Absolute: 57.8 10*3/uL (ref 19.0–186.0)

## 2018-01-23 LAB — IRON AND TIBC
Iron: 14 ug/dL — ABNORMAL LOW (ref 28–170)
Saturation Ratios: 3 % — ABNORMAL LOW (ref 10.4–31.8)
TIBC: 484 ug/dL — ABNORMAL HIGH (ref 250–450)
UIBC: 470 ug/dL

## 2018-01-23 LAB — VITAMIN B12: Vitamin B-12: 186 pg/mL (ref 180–914)

## 2018-01-23 LAB — FERRITIN: FERRITIN: 4 ng/mL — AB (ref 11–307)

## 2018-01-23 LAB — FOLATE: Folate: 14.8 ng/mL (ref >5.9)

## 2018-01-23 SURGERY — COLONOSCOPY WITH PROPOFOL
Anesthesia: Moderate Sedation

## 2018-01-23 MED ORDER — MIDAZOLAM HCL 5 MG/ML IJ SOLN
INTRAMUSCULAR | Status: AC
  Filled 2018-01-23: qty 2

## 2018-01-23 MED ORDER — FENTANYL CITRATE (PF) 100 MCG/2ML IJ SOLN
INTRAMUSCULAR | Status: DC | PRN
  Administered 2018-01-23: 12.5 ug via INTRAVENOUS
  Administered 2018-01-23: 25 ug via INTRAVENOUS
  Administered 2018-01-23: 12.5 ug via INTRAVENOUS

## 2018-01-23 MED ORDER — FENTANYL CITRATE (PF) 100 MCG/2ML IJ SOLN
INTRAMUSCULAR | Status: AC
  Filled 2018-01-23: qty 2

## 2018-01-23 MED ORDER — MIDAZOLAM HCL 10 MG/2ML IJ SOLN
INTRAMUSCULAR | Status: DC | PRN
  Administered 2018-01-23 (×3): .5 mg via INTRAVENOUS
  Administered 2018-01-23: 2 mg via INTRAVENOUS
  Administered 2018-01-23 (×3): .5 mg via INTRAVENOUS

## 2018-01-23 MED ORDER — SODIUM CHLORIDE 0.9 % IV SOLN
510.0000 mg | Freq: Once | INTRAVENOUS | Status: AC
  Administered 2018-01-23: 510 mg via INTRAVENOUS
  Filled 2018-01-23: qty 17

## 2018-01-23 SURGICAL SUPPLY — 21 items

## 2018-01-23 NOTE — Progress Notes (Signed)
Lisa Owens 8:24 AM  Subjective: Patient did well with prep and her bowels are lighter and she has no other complaints  Objective: Vital signs stable afebrile no acute distress exam please see preassessment evaluation hemoglobin 8.1  Assessment: Guaiac positive anemia on liquids  Plan: Okay to proceed with colonoscopy today however her blood thinner needs going forward need to be addressed possibly with a hematology consult possibly withdrawing hematologic panel for clotting disorder including protein C or S deficiency etc.and will need iron as an outpatient and consider iron infusion here [facility]  Pager [phone] After 5PM or if no answer call [phone]

## 2018-01-23 NOTE — Op Note (Signed)
[facility] Patient Name: Lisa Owens Procedure Date: 01/23/2018 MRN: [id number] Attending MD: Clarene Essex , MD Date of Birth: 07-Jun-1940 CSN: [id number] Age: 77 Admit Type: Inpatient Procedure:                Colonoscopy Indications:              Screening for colorectal malignant neoplasm, This                            is the patient's first colonoscopy, Incidental -                            Heme positive stool, Incidental - Iron deficiency                            anemia Providers:                Clarene Essex, MD, Cleda Daub, RN, Tinnie Gens,                            Technician Referring MD:              Medicines:                Fentanyl 50 micrograms IV, Midazolam 5 mg IV Complications:            No immediate complications. Estimated Blood Loss:     Estimated blood loss: none. Procedure:                Pre-Anesthesia Assessment:                           - Prior to the procedure, a History and Physical                            was performed, and patient medications and                            allergies were reviewed. The patient's tolerance of                            previous anesthesia was also reviewed. The risks                            and benefits of the procedure and the sedation                            options and risks were discussed with the patient.                            All questions were answered, and informed consent                            was obtained. Prior Anticoagulants: The patient has                            taken  Eliquis (apixaban), last dose was 2 days                            prior to procedure. ASA Grade Assessment: II - A                            patient with mild systemic disease. After reviewing                            the risks and benefits, the patient was deemed in                            satisfactory condition to undergo the procedure.                           After obtaining informed  consent, the colonoscope                            was passed under direct vision. Throughout the                            procedure, the patient's blood pressure, pulse, and                            oxygen saturations were monitored continuously. The                            [id number]-[id number] ([id number] Olympus peds colonscope was                            introduced through the anus and advanced to the the                            terminal ileum. The terminal ileum, ileocecal                            valve, appendiceal orifice, and rectum were                            photographed. The colonoscopy was performed without                            difficulty. The patient tolerated the procedure                            well. The quality of the bowel preparation was                            adequate. Scope In: 8:43:49 AM Scope Out: 9:02:51 AM Scope Withdrawal Time: 0 hours 13 minutes 58 seconds  Total Procedure Duration: 0 hours 19 minutes 2 seconds  Findings:      External and internal hemorrhoids were found during retroflexion, during       perianal exam and during digital exam. The  hemorrhoids were small.      Scattered small-mouthed diverticula were found in the sigmoid colon.      A few small-mouthed diverticula were found in the descending colon and       transverse colon.      The terminal ileum appeared normal.      The exam was otherwise without abnormality. Impression:               - External and internal hemorrhoids.                           - Diverticulosis in the sigmoid colon.                           - Diverticulosis in the descending colon and in the                            transverse colon.                           - The examined portion of the ileum was normal.                           - The examination was otherwise normal.                           - No specimens collected. Moderate Sedation:      Moderate (conscious) sedation was administered by  the endoscopy nurse       and supervised by the endoscopist. The following parameters were       monitored: oxygen saturation, heart rate, blood pressure, respiratory       rate, EKG, adequacy of pulmonary ventilation, and response to care. Recommendation:           - Patient has a contact number available for                            emergencies. The signs and symptoms of potential                            delayed complications were discussed with the                            patient. Return to normal activities tomorrow.                            Written discharge instructions were provided to the                            patient.                           - Soft diet today. Will need iron consider IV iron                            while here                           -  Continue present medications. See progress note                            for discussion of blood thinner needs questionable                            clotting factor workup and consider CT scan of the                            abdomen and possibly capsule endoscopy if signs of                            GI bleeding continue                           - Repeat colonoscopy PRN for screening purposes. If                            she does require blood thinners long-term possibly                            use the lowest possible dose and safest 1 including                            possibly just an aspirin a day                           - Return to GI office in 3 weeks.                           - Telephone GI clinic if symptomatic PRN. Procedure Code(s):        --- Professional ---                           [phone], Colonoscopy, flexible; diagnostic, including                            collection of specimen(s) by brushing or washing,                            when performed (separate procedure) Diagnosis Code(s):        --- Professional ---                           Z12.11, Encounter for screening for  malignant                            neoplasm of colon                           K57.30, Diverticulosis of large intestine without                            perforation or abscess without bleeding CPT copyright 2017 American Medical Association. All rights  reserved. The codes documented in this report are preliminary and upon coder review may  be revised to meet current compliance requirements. Clarene Essex, MD 01/23/2018 9:18:18 AM This report has been signed electronically. Number of Addenda: 0

## 2018-01-23 NOTE — Discharge Summary (Signed)
Physician Discharge Summary  Patient ID: Lisa Owens MRN: [id number] DOB/AGE: 1941-01-25 77 y.o.  Admit date: 01/21/2018 Discharge date: 01/23/2018  Admission Diagnoses:   Lower GI bleed   Anemia of ABL   Hx recurrent DVT   DJD    Discharge Diagnoses:    Lower GI bleed   Anemia of ABL + Fe Def   Iron deficiency   Melena   Hx L common femoral vein DVT 2018    Hx R peroneal/ post tibial DVT, symptomatic 11/2017   Discharged Condition: good  Presenting Summary: Lisa Owens 77 year old female with medical history significant for recurrent DVTs, on Eliquis and chronic low back pain.  Patient was sent to the emergency department after being sent by PCP for abnormal lab value.  Low hemoglobin.  Patient report feeling fatigue, weak and shortness of breath with exertion.  Also report having dark/black stools for the past 2 to 3 weeks prior to admission.  Lab at PCP shows hemoglobin of 8.2 therefore she was sent to the ED for further evaluation.  Upon ED evaluation hemoglobin was found 7.9.  FOBT negative and patient was admitted with working diagnosis of possible GI bleed in setting of anticoagulation.  GI was consulted and underwent endoscopy.   Hospital course/ Problems:  1) Melena/ Lower GI Bleed/ Symptomatic anemia: first episode of GIB.  Seen by GI.   EGD 9/12 showed: - Medium-sized hiatal hernia and nonbleeding duod diverticulum. Stomach and rest of duodenum were normal.   Colonscopy 9/13 showed: - External and internal hemorrhoids, the hemorrhoids were small. - Scattered small-mouthed diverticula were found in the sigmoid colon, and a few small-mouthed diverticula were found in the descending colon and transverse colon, the terminal ileum appeared normal.  - discussions held w/ patient about the risks/ benefits of options regarding DVT Rx, including continuing Eliquis w risk of recur GIB vs  stopping Eliquis +/- placement of IVC filter. Her 2nd DVT was below the knee but was  clearly symptomatic. The patient is not interested in holding Eliquis or in having IVC placed. She would like to continue the Eliquis. She states she will discuss further w/ her PCP after DC in 1-2 wks she has an appt.  Will resume Eliquis on DC.    - for Fe deficiency (low tsat % and low ferriting, low MCV) pt is agreeable to IV feraheme (510 mg) x 1 to be given today prior to dc home. She will d/w her PCP further if more po or IV iron is needed.   2) Recurrent DVT - hx symptomatic L upper leg (comm femoral) DVT Jan 2018, treated w/ 3 mos Eliquis -  Hx symptomatic R lower leg (+peroneal/ post tibial per doppler) DVT in July 2019 > started on Eliquis (lifelong) - see plan as above  3)  DJD   Consultants:   GI - [facility]   Procedures:   EGD 01/22/18     Discharge Exam: Blood pressure 118/66, pulse 79, temperature 98.3 F (36.8 C), temperature source Oral, resp. rate 16, height 5\' 3"  (1.6 m), weight 97.5 kg, SpO2 98 %. Alert, no distress  no jvd  chest cta bilat  cor reg no mrg  abd soft ntnd   ext no edema, no erythema or tenderness  nf o x3  Disposition: Discharge disposition: 01-Home or Self Care       Allergies as of 01/23/2018   No Known Allergies     Medication List    TAKE these medications  BLUE-EMU MAXIMUM STRENGTH 2.5 % Liqd Generic drug:  Menthol (Topical Analgesic) Apply 1 application topically 2 (two) times daily as needed (back pain).   ELIQUIS 5 MG Tabs tablet Generic drug:  apixaban Take 5 mg by mouth 2 (two) times daily.        Signed: Sandy Salaam Bonney Berres 01/23/2018, 3:45 PM

## 2018-01-25 LAB — TYPE AND SCREEN
ABO/RH(D): A POS
ANTIBODY SCREEN: NEGATIVE
Unit division: 0
Unit division: 0
Unit division: 0

## 2018-01-25 LAB — BPAM RBC
BLOOD PRODUCT EXPIRATION DATE: 201910042359
Blood Product Expiration Date: 201910042359
Blood Product Expiration Date: 201910042359
ISSUE DATE / TIME: 201909121324
Unit Type and Rh: 6200
Unit Type and Rh: 6200
Unit Type and Rh: 6200

## 2018-01-26 ENCOUNTER — Encounter (HOSPITAL_COMMUNITY): Payer: Self-pay | Admitting: Gastroenterology

## 2018-01-27 DIAGNOSIS — Z86718 Personal history of other venous thrombosis and embolism: Secondary | ICD-10-CM | POA: Diagnosis not present

## 2018-01-27 DIAGNOSIS — D500 Iron deficiency anemia secondary to blood loss (chronic): Secondary | ICD-10-CM | POA: Diagnosis not present

## 2018-02-14 DIAGNOSIS — M545 Low back pain: Secondary | ICD-10-CM | POA: Diagnosis not present

## 2018-03-19 DIAGNOSIS — M47816 Spondylosis without myelopathy or radiculopathy, lumbar region: Secondary | ICD-10-CM | POA: Diagnosis not present

## 2018-04-14 ENCOUNTER — Other Ambulatory Visit: Payer: Self-pay

## 2018-04-14 ENCOUNTER — Emergency Department (HOSPITAL_COMMUNITY): Payer: Medicare HMO

## 2018-04-14 ENCOUNTER — Encounter (HOSPITAL_COMMUNITY): Payer: Self-pay | Admitting: *Deleted

## 2018-04-14 ENCOUNTER — Emergency Department (HOSPITAL_COMMUNITY)
Admission: EM | Admit: 2018-04-14 | Discharge: 2018-04-14 | Disposition: A | Discharge status: Home / Self Care | Payer: Medicare HMO | Attending: Emergency Medicine | Admitting: Emergency Medicine

## 2018-04-14 DIAGNOSIS — Z7901 Long term (current) use of anticoagulants: Secondary | ICD-10-CM | POA: Insufficient documentation

## 2018-04-14 DIAGNOSIS — N200 Calculus of kidney: Secondary | ICD-10-CM | POA: Insufficient documentation

## 2018-04-14 DIAGNOSIS — M549 Dorsalgia, unspecified: Secondary | ICD-10-CM | POA: Diagnosis not present

## 2018-04-14 DIAGNOSIS — R1032 Left lower quadrant pain: Secondary | ICD-10-CM | POA: Diagnosis not present

## 2018-04-14 DIAGNOSIS — N202 Calculus of kidney with calculus of ureter: Secondary | ICD-10-CM | POA: Diagnosis not present

## 2018-04-14 DIAGNOSIS — K625 Hemorrhage of anus and rectum: Secondary | ICD-10-CM | POA: Diagnosis not present

## 2018-04-14 DIAGNOSIS — Z79899 Other long term (current) drug therapy: Secondary | ICD-10-CM | POA: Insufficient documentation

## 2018-04-14 DIAGNOSIS — M47816 Spondylosis without myelopathy or radiculopathy, lumbar region: Secondary | ICD-10-CM | POA: Diagnosis not present

## 2018-04-14 LAB — URINALYSIS, ROUTINE W REFLEX MICROSCOPIC
BACTERIA UA: NONE SEEN
Bilirubin Urine: NEGATIVE
Glucose, UA: 50 mg/dL — AB
Ketones, ur: NEGATIVE mg/dL
Leukocytes, UA: NEGATIVE
Nitrite: NEGATIVE
Protein, ur: NEGATIVE mg/dL
Specific Gravity, Urine: 1.029 (ref 1.005–1.030)
pH: 7 (ref 5.0–8.0)

## 2018-04-14 LAB — CBC
HEMATOCRIT: 43.5 % (ref 36.0–46.0)
HEMOGLOBIN: 13.6 g/dL (ref 12.0–15.0)
MCH: 27.5 pg (ref 26.0–34.0)
MCHC: 31.3 g/dL (ref 30.0–36.0)
MCV: 87.9 fL (ref 80.0–100.0)
PLATELETS: 273 10*3/uL (ref 150–400)
RBC: 4.95 MIL/uL (ref 3.87–5.11)
RDW: 21.3 % — ABNORMAL HIGH (ref 11.5–15.5)
WBC: 11.1 10*3/uL — ABNORMAL HIGH (ref 4.0–10.5)
nRBC: 0 % (ref 0.0–0.2)

## 2018-04-14 LAB — I-STAT CHEM 8, ED
BUN: 22 mg/dL (ref 8–23)
CHLORIDE: 111 mmol/L (ref 98–111)
Calcium, Ion: 1.02 mmol/L — ABNORMAL LOW (ref 1.15–1.40)
Creatinine, Ser: 0.8 mg/dL (ref 0.44–1.00)
Glucose, Bld: 184 mg/dL — ABNORMAL HIGH (ref 70–99)
HEMATOCRIT: 42 % (ref 36.0–46.0)
Hemoglobin: 14.3 g/dL (ref 12.0–15.0)
POTASSIUM: 4.2 mmol/L (ref 3.5–5.1)
SODIUM: 141 mmol/L (ref 135–145)
TCO2: 22 mmol/L (ref 22–32)

## 2018-04-14 LAB — I-STAT CG4 LACTIC ACID, ED
LACTIC ACID, VENOUS: 1.68 mmol/L (ref 0.5–1.9)
Lactic Acid, Venous: 4.06 mmol/L (ref 0.5–1.9)

## 2018-04-14 LAB — COMPREHENSIVE METABOLIC PANEL
ALK PHOS: 60 U/L (ref 38–126)
ALT: 17 U/L (ref 0–44)
ANION GAP: 14 (ref 5–15)
AST: 31 U/L (ref 15–41)
Albumin: 4 g/dL (ref 3.5–5.0)
BUN: 20 mg/dL (ref 8–23)
CALCIUM: 9.5 mg/dL (ref 8.9–10.3)
CO2: 18 mmol/L — AB (ref 22–32)
Chloride: 109 mmol/L (ref 98–111)
Creatinine, Ser: 0.84 mg/dL (ref 0.44–1.00)
GFR calc non Af Amer: >60 mL/min (ref >60)
GLUCOSE: 182 mg/dL — AB (ref 70–99)
Potassium: 3.9 mmol/L (ref 3.5–5.1)
SODIUM: 141 mmol/L (ref 135–145)
Total Bilirubin: 0.6 mg/dL (ref 0.3–1.2)
Total Protein: 7.9 g/dL (ref 6.5–8.1)

## 2018-04-14 LAB — TYPE AND SCREEN
ABO/RH(D): A POS
Antibody Screen: NEGATIVE

## 2018-04-14 MED ORDER — SODIUM CHLORIDE 0.9 % IV BOLUS
1000.0000 mL | Freq: Once | INTRAVENOUS | Status: AC
  Administered 2018-04-14: 1000 mL via INTRAVENOUS

## 2018-04-14 MED ORDER — IOPAMIDOL (ISOVUE-370) INJECTION 76%
INTRAVENOUS | Status: AC
  Filled 2018-04-14: qty 100

## 2018-04-14 MED ORDER — SODIUM CHLORIDE (PF) 0.9 % IJ SOLN
INTRAMUSCULAR | Status: AC
  Filled 2018-04-14: qty 50

## 2018-04-14 MED ORDER — TAMSULOSIN HCL 0.4 MG PO CAPS: 0.4000 mg | ORAL_CAPSULE | Freq: Every day | ORAL | 0 refills | Status: DC

## 2018-04-14 MED ORDER — ONDANSETRON 4 MG PO TBDP: ORAL_TABLET | ORAL | 0 refills | Status: DC

## 2018-04-14 MED ORDER — IOPAMIDOL (ISOVUE-370) INJECTION 76%
100.0000 mL | Freq: Once | INTRAVENOUS | Status: AC | PRN
  Administered 2018-04-14: 100 mL via INTRAVENOUS

## 2018-04-14 MED ORDER — MORPHINE SULFATE 15 MG PO TABS: 15.0000 mg | ORAL_TABLET | ORAL | 0 refills | Status: DC | PRN

## 2018-04-14 MED ORDER — MORPHINE SULFATE (PF) 4 MG/ML IV SOLN
4.0000 mg | Freq: Once | INTRAVENOUS | Status: AC
  Administered 2018-04-14: 4 mg via INTRAVENOUS
  Filled 2018-04-14: qty 1

## 2018-04-14 MED ORDER — ONDANSETRON HCL 4 MG/2ML IJ SOLN
4.0000 mg | Freq: Once | INTRAMUSCULAR | Status: AC
  Administered 2018-04-14: 4 mg via INTRAVENOUS
  Filled 2018-04-14: qty 2

## 2018-04-14 NOTE — Discharge Instructions (Addendum)
Follow up with a urologist.  Return for worsening pain, fever, inability to eat or drink

## 2018-04-14 NOTE — ED Notes (Signed)
RN aware of pts elevated BP 

## 2018-04-14 NOTE — ED Provider Notes (Signed)
[facility] DEPT Provider Note   CSN: [id number] Arrival date & time: 04/14/18  4742     History   Chief Complaint Chief Complaint  Patient presents with  . Rectal Bleeding    HPI Lisa Owens is a 77 y.o. female.  77 yo F with dark and tarry stool and left lower quadrant abdominal pain going on for the past 3 to 4 days.  States it started when she was [facility].  Is gotten progressively worse.  She went and saw Dr. Herma Mering who gave her injections into her back, she has had problems with her back as well.  She thinks her pain primarily is in her abdomen.  She has felt hot and cold.  Is unable to find a comfortable position, difficult to obtain history secondary to patient's pain.  The history is provided by the patient.  Rectal Bleeding  Quality:  Black and tarry Amount:  Scant Duration:  2 days Timing:  Constant Chronicity:  New Similar prior episodes: yes   Relieved by:  Nothing Worsened by:  Nothing Ineffective treatments:  None tried Associated symptoms: abdominal pain and fever (subjective)   Associated symptoms: no dizziness and no vomiting     Past Medical History:  Diagnosis Date  . Arthritis   . DVT (deep venous thrombosis) ([place])   . Headache(784.0)    sinus    Patient Active Problem List   Diagnosis Date Noted  . Melena 01/21/2018  . Gastric bleed 01/21/2018  . GI bleed 01/21/2018    Past Surgical History:  Procedure Laterality Date  . BUNIONECTOMY  2001   along with hammer toe  . CHOLECYSTECTOMY  2007  . COLONOSCOPY WITH PROPOFOL N/A 01/23/2018   Procedure: COLONOSCOPY WITH PROPOFOL;  Surgeon: Clarene Essex, MD;  Location: [facility];  Service: Endoscopy;  Laterality: N/A;  . ESOPHAGOGASTRODUODENOSCOPY (EGD) WITH PROPOFOL N/A 01/22/2018   Procedure: ESOPHAGOGASTRODUODENOSCOPY (EGD) WITH PROPOFOL;  Surgeon: Clarene Essex, MD;  Location: [facility];  Service: Endoscopy;  Laterality: N/A;  . EYE SURGERY  2011   bilateral cataract  . [place]  2001  . SHOULDER OPEN ROTATOR CUFF REPAIR  09/19/2011   Procedure: ROTATOR CUFF REPAIR SHOULDER OPEN;  Surgeon: Johnn Hai, MD;  Location: [facility];  Service: Orthopedics;  Laterality: Right;  . SUBACROMIAL DECOMPRESSION  09/19/2011   Procedure: SUBACROMIAL DECOMPRESSION;  Surgeon: Johnn Hai, MD;  Location: [facility];  Service: Orthopedics;  Laterality: Right;  . tummy tuck  2006  . UMBILICAL HERNIA REPAIR  2007   along with gallbladder     OB History   None      Home Medications    Prior to Admission medications   Medication Sig Start Date End Date Taking? Authorizing Provider  [facility] SLOW RELEASE IRON 143 (45 Fe) MG TBCR Take 1 tablet by mouth daily. 01/21/18  Yes [provider]  ELIQUIS 5 MG TABS tablet Take 5 mg by mouth 2 (two) times daily. 12/26/17  Yes [provider]  HYDROcodone-acetaminophen (NORCO) 10-325 MG tablet Take 1 tablet by mouth daily as needed for pain. 02/27/18  Yes [provider]  Menthol, Topical Analgesic, (BLUE-EMU MAXIMUM STRENGTH) 2.5 % LIQD Apply 1 application topically 2 (two) times daily as needed (back pain).   Yes [provider]  omeprazole (PRILOSEC) 40 MG capsule Take 40 mg by mouth daily. 02/26/18  Yes [provider]  oxyCODONE-acetaminophen (PERCOCET) 10-325 MG tablet Take 1 tablet by mouth daily as needed  for pain. 03/19/18  Yes [provider]  morphine (MSIR) 15 MG tablet Take 1 tablet (15 mg total) by mouth every 4 (four) hours as needed for severe pain. 04/14/18   Deno Etienne, DO  ondansetron (ZOFRAN ODT) 4 MG disintegrating tablet 4mg  ODT q4 hours prn nausea/vomit 04/14/18   Deno Etienne, DO  tamsulosin (FLOMAX) 0.4 MG CAPS capsule Take 1 capsule (0.4 mg total) by mouth daily after supper. 04/14/18   Deno Etienne, DO    Family History No family history on file.  Social History Social History   Tobacco Use  . Smoking status: Never Smoker  .  Smokeless tobacco: Never Used  Substance Use Topics  . Alcohol use: Yes    Alcohol/week: 1.0 standard drinks    Types: 1 Glasses of wine per week    Comment: socially  . Drug use: No     Allergies   Patient has no known allergies.   Review of Systems Review of Systems  Constitutional: Positive for fever (subjective). Negative for chills.  HENT: Negative for congestion and rhinorrhea.   Eyes: Negative for redness and visual disturbance.  Respiratory: Negative for shortness of breath and wheezing.   Cardiovascular: Negative for chest pain and palpitations.  Gastrointestinal: Positive for abdominal pain and hematochezia. Negative for nausea and vomiting.  Genitourinary: Negative for dysuria and urgency.  Musculoskeletal: Positive for back pain. Negative for arthralgias and myalgias.  Skin: Negative for pallor and wound.  Neurological: Negative for dizziness and headaches.     Physical Exam Updated Vital Signs BP (!) 166/92 (BP Location: Left Arm)   Pulse 88   Temp 98 F (36.7 C) (Oral)   Resp 18   Ht 5\' 3"  (1.6 m)   Wt 97.5 kg   SpO2 98%   BMI 38.09 kg/m   Physical Exam  Constitutional: She is oriented to person, place, and time. She appears well-developed and well-nourished. No distress.  Patient is moaning and rocking and moving around in the bed.  HENT:  Head: Normocephalic and atraumatic.  Eyes: Pupils are equal, round, and reactive to light. EOM are normal.  Neck: Normal range of motion. Neck supple.  Cardiovascular: Normal rate and regular rhythm. Exam reveals no gallop and no friction rub.  No murmur heard. Pulmonary/Chest: Effort normal. She has no wheezes. She has no rales.  Abdominal: Soft. She exhibits no distension and no mass. There is tenderness. There is no guarding.  Tender to palpation worst about the left lower quadrant and the left flank.  Musculoskeletal: She exhibits no edema or tenderness.  Sitting with her left foot dangling off the bed,  pulse motor and sensation is intact to the left foot.  Neurological: She is alert and oriented to person, place, and time.  Skin: Skin is warm and dry. She is not diaphoretic.  Psychiatric: She has a normal mood and affect. Her behavior is normal.  Nursing note and vitals reviewed.    ED Treatments / Results  Labs (all labs ordered are listed, but only abnormal results are displayed) Labs Reviewed  COMPREHENSIVE METABOLIC PANEL - Abnormal; Notable for the following components:      Result Value   CO2 18 (*)    Glucose, Bld 182 (*)    All other components within normal limits  CBC - Abnormal; Notable for the following components:   WBC 11.1 (*)    RDW 21.3 (*)    All other components within normal limits  URINALYSIS, ROUTINE W REFLEX MICROSCOPIC -  Abnormal; Notable for the following components:   Color, Urine COLORLESS (*)    Glucose, UA 50 (*)    Hgb urine dipstick SMALL (*)    All other components within normal limits  I-STAT CHEM 8, ED - Abnormal; Notable for the following components:   Glucose, Bld 184 (*)    Calcium, Ion 1.02 (*)    All other components within normal limits  I-STAT CG4 LACTIC ACID, ED - Abnormal; Notable for the following components:   Lactic Acid, Venous 4.06 (*)    All other components within normal limits  I-STAT CG4 LACTIC ACID, ED  POC OCCULT BLOOD, ED  TYPE AND SCREEN    EKG None  Radiology Ct Lumbar Spine Wo Contrast  Result Date: 04/14/2018 CLINICAL DATA:  77 y/o F; history of GI bleed. Presenting with black tarry stools, back pain, abdominal pain, nausea. History of chronic back pain. Post back injections today. EXAM: CT LUMBAR SPINE WITHOUT CONTRAST TECHNIQUE: Multidetector CT imaging of the lumbar spine was performed without intravenous contrast administration. Multiplanar CT image reconstructions were also generated. COMPARISON:  Concurrent CT angiogram of the abdomen and pelvis. 06/28/2005 CT abdomen and pelvis. FINDINGS: Segmentation: 5  lumbar type vertebrae. Alignment: Moderate levocurvature with apex at L3. Straightening of lumbar lordosis without listhesis. Vertebrae: No acute fracture or dislocation. No loss of vertebral body height. Paraspinal and other soft tissues: Cholecystectomy. Abdominal aortic calcific atherosclerosis. Disc levels: Lumbar spondylosis with moderate to severe loss of intervertebral disc space height at L1-L3 and L5-S1. Prominent multilevel facet arthropathy greater on the right. Endplate marginal osteophytes and facet hypertrophy at the left L5-S1 level result in bony foraminal stenosis. No high-grade canal stenosis. IMPRESSION: 1. No acute fracture or dislocation of the lumbar spine. 2. Lumbar spondylosis with moderate levocurvature and prominent multilevel right-sided facet arthropathy. No high-grade bony foraminal or canal stenosis. Electronically Signed   By: Kristine Garbe M.D.   On: 04/14/2018 18:16   Ct Angio Abd/pel W And/or Wo Contrast  Result Date: 04/14/2018 CLINICAL DATA:  Initial evaluation for acute mesenteric ischemia, history of GI bleed with black tarry stools. EXAM: CTA ABDOMEN AND PELVIS wITHOUT AND WITH CONTRAST TECHNIQUE: Multidetector CT imaging of the abdomen and pelvis was performed using the standard protocol during bolus administration of intravenous contrast. Multiplanar reconstructed images and MIPs were obtained and reviewed to evaluate the vascular anatomy. CONTRAST:  162mL ISOVUE-370 IOPAMIDOL (ISOVUE-370) INJECTION 76% COMPARISON:  Prior CT from 06/28/2005. FINDINGS: VASCULAR Aorta: Normal intravascular enhancement seen throughout the intra-abdominal aorta without evidence for dissection or aneurysm. Moderate aortic atherosclerosis. Celiac: Atheromatous plaque at the origin of the celiac axis with associated narrowing of up to 30%. Celiac and its branch vessels widely patent distally. Splenic artery tortuous but widely patent. SMA: SMA widely patent to its distal aspect  without stenosis or occlusion. Renals: Single left renal artery with 2 right-sided renal arteries. Atheromatous plaque at the origin of the left renal artery with associated stenosis of up to approximately 60%. Focal plaque at the origin of the right renal arteries with up to approximate 50%. Renal arteries otherwise patent distally. IMA: Focal plaque at the origin of the IMA without significant stenosis. IMA widely patent distally without occlusion or other abnormality. Inflow: Iliac arteries widely patent bilaterally without stenosis. Proximal Outflow: Visualized proximal outflow vessels widely patent. Veins: No filling defect to suggest venous thrombosis. No other abnormality identified. Review of the MIP images confirms the above findings. NON-VASCULAR Lower chest: Large hiatal hernia with the majority  of the stomach position within the left lower thorax. Associated left basilar atelectasis and/or consolidation. Visualized lungs are otherwise clear. Hepatobiliary: Liver demonstrates a normal contrast enhanced appearance. Gallbladder surgically absent. No biliary dilatation. Pancreas: Pancreas within normal limits. Spleen: Spleen within normal limits. Adrenals/Urinary Tract: Adrenal glands are normal. Left kidney asymmetrically enlarged with delayed nephrogram and perinephric fat stranding. There is an obstructive 5 mm stone within the distal left ureter with secondary moderate left hydroureteronephrosis. No other radiopaque calculi within the left kidney or along the course of the left renal collecting system. No right-sided calculi or evidence for obstructive uropathy identified. Scattered subcentimeter hypodensities within the kidneys bilaterally too small the characterize, but statistically likely reflects small cyst. No focal enhancing renal mass. Bladder largely decompressed without abnormality. No layering stones within the bladder lumen. Stomach/Bowel: Large hiatal hernia with the majority of the stomach  and within the left lower chest. No other acute abnormality about the stomach. Large diverticulum noted arising from the third-fourth portion of the duodenum without associated inflammation. No evidence for bowel obstruction. Colonic diverticulosis without evidence for acute diverticulitis. Normal appendix. No acute inflammatory changes seen about the bowels. No appreciable areas of active GI bleeding identified. Lymphatic: No adenopathy. Reproductive: Uterus within normal limits. Left ovary normal. Right ovary not discretely identified. No adnexal mass. Other: No free air or fluid. Small fat containing paraumbilical hernia without associated inflammation. Musculoskeletal: Advanced thoracolumbar levoscoliosis. No acute osseous abnormality. No discrete lytic or blastic osseous lesions. IMPRESSION: VASCULAR 1. No CT evidence for acute vascular abnormality within the abdomen and pelvis. No appreciable active GI bleeding. 2. Moderate atherosclerotic change as above. 3. 60% stenosis at the origin of the single left renal artery. Approximate 50% stenoses at the origin of the dual right renal arteries. 4. 30% stenosis at the origin of the celiac artery. NON-VASCULAR 1. 5 mm obstructive stone within the distal left ureter with secondary moderate left hydroureteronephrosis. 2. No other acute intra-abdominal or pelvic process. 3. Colonic diverticulosis without evidence for acute diverticulitis. 4. Large hiatal hernia with the majority of the stomach and within the left lower thorax. Associated left basilar atelectasis and/or consolidation. Electronically Signed   By: Jeannine Boga M.D.   On: 04/14/2018 18:57    Procedures Procedures (including critical care time)  Medications Ordered in ED Medications  sodium chloride (PF) 0.9 % injection (has no administration in time range)  iopamidol (ISOVUE-370) 76 % injection (has no administration in time range)  sodium chloride 0.9 % bolus 1,000 mL (0 mLs Intravenous  Stopped 04/14/18 1958)  morphine 4 MG/ML injection 4 mg (4 mg Intravenous Given 04/14/18 1616)  ondansetron (ZOFRAN) injection 4 mg (4 mg Intravenous Given 04/14/18 1616)  iopamidol (ISOVUE-370) 76 % injection 100 mL (100 mLs Intravenous Contrast Given 04/14/18 1735)     Initial Impression / Assessment and Plan / ED Course  I have reviewed the triage vital signs and the nursing notes.  Pertinent labs & imaging results that were available during my care of the patient were reviewed by me and considered in my medical decision making (see chart for details).     77 yo F with a chief complaint of left lower quadrant abdominal pain and dark tarry stools.  Patient thinks this is similar to when she had diverticulitis.  Going on for the past couple days.  Subjectively febrile at home.  Just had a spinal injection prior to coming here.  Will obtain a CT of the abdomen pelvis treat the patient's  pain and nausea and reassess.   CT scan with left-sided nephrolithiasis.  I reassessed the patient and she feels completely better.  No pain.  Lactate cleared with IV fluids.  I discussed inpatient versus outpatient therapy based on her lactate and she is requesting discharge.  Given follow-up with urology.  Return precautions discussed.  CRITICAL CARE Performed by: Cecilio Asper   Total critical care time: 35 minutes  Critical care time was exclusive of separately billable procedures and treating other patients.  Critical care was necessary to treat or prevent imminent or life-threatening deterioration.  Critical care was time spent personally by me on the following activities: development of treatment plan with patient and/or surrogate as well as nursing, discussions with consultants, evaluation of patient's response to treatment, examination of patient, obtaining history from patient or surrogate, ordering and performing treatments and interventions, ordering and review of laboratory studies,  ordering and review of radiographic studies, pulse oximetry and re-evaluation of patient's condition.  The patients results and plan were reviewed and discussed.   Any x-rays performed were independently reviewed by myself.   Differential diagnosis were considered with the presenting HPI.  Medications  sodium chloride (PF) 0.9 % injection (has no administration in time range)  iopamidol (ISOVUE-370) 76 % injection (has no administration in time range)  sodium chloride 0.9 % bolus 1,000 mL (0 mLs Intravenous Stopped 04/14/18 1958)  morphine 4 MG/ML injection 4 mg (4 mg Intravenous Given 04/14/18 1616)  ondansetron (ZOFRAN) injection 4 mg (4 mg Intravenous Given 04/14/18 1616)  iopamidol (ISOVUE-370) 76 % injection 100 mL (100 mLs Intravenous Contrast Given 04/14/18 1735)    Vitals:   04/14/18 1509 04/14/18 1639 04/14/18 1843 04/14/18 1948  BP: (!) 124/113 (!) 181/97 (!) 162/97 (!) 166/92  Pulse: (!) 117 84 90 88  Resp: 18 (!) 22 18 18   Temp: 98 F (36.7 C)     TempSrc: Oral     SpO2: 98% 100% 97% 98%  Weight: 97.5 kg     Height: 5\' 3"  (1.6 m)       Final diagnoses:  Nephrolithiasis    Admission/ observation were discussed with the admitting physician, patient and/or family and they are comfortable with the plan.    Final Clinical Impressions(s) / ED Diagnoses   Final diagnoses:  Nephrolithiasis    ED Discharge Orders         Ordered    tamsulosin (FLOMAX) 0.4 MG CAPS capsule  Daily after supper     04/14/18 2046    morphine (MSIR) 15 MG tablet  Every 4 hours PRN     04/14/18 2046    ondansetron (ZOFRAN ODT) 4 MG disintegrating tablet     04/14/18 2046           Deno Etienne, DO 04/14/18 2052

## 2018-04-14 NOTE — ED Notes (Signed)
Pt was given a Sprite.

## 2018-04-14 NOTE — ED Triage Notes (Signed)
HIstory of GI bleed, now having black tarry stools, back and abd pain. Nausea

## 2018-04-14 NOTE — ED Notes (Signed)
Ed provider Tyrone Nine notified patient has a critical lactic acid value of 4.06

## 2018-04-14 NOTE — ED Notes (Signed)
Acuity changed per Matt RN 

## 2018-04-16 DIAGNOSIS — N201 Calculus of ureter: Secondary | ICD-10-CM | POA: Diagnosis not present

## 2018-04-29 DIAGNOSIS — G894 Chronic pain syndrome: Secondary | ICD-10-CM | POA: Diagnosis not present

## 2018-04-29 DIAGNOSIS — M48061 Spinal stenosis, lumbar region without neurogenic claudication: Secondary | ICD-10-CM | POA: Diagnosis not present

## 2018-05-20 DIAGNOSIS — M545 Low back pain: Secondary | ICD-10-CM | POA: Diagnosis not present

## 2018-05-20 DIAGNOSIS — Z86718 Personal history of other venous thrombosis and embolism: Secondary | ICD-10-CM | POA: Diagnosis not present

## 2018-05-26 DIAGNOSIS — M48061 Spinal stenosis, lumbar region without neurogenic claudication: Secondary | ICD-10-CM | POA: Diagnosis not present

## 2018-05-26 DIAGNOSIS — M47816 Spondylosis without myelopathy or radiculopathy, lumbar region: Secondary | ICD-10-CM | POA: Diagnosis not present

## 2018-06-09 DIAGNOSIS — M545 Low back pain: Secondary | ICD-10-CM | POA: Diagnosis not present

## 2018-06-09 DIAGNOSIS — M47896 Other spondylosis, lumbar region: Secondary | ICD-10-CM | POA: Diagnosis not present

## 2018-06-09 DIAGNOSIS — M48061 Spinal stenosis, lumbar region without neurogenic claudication: Secondary | ICD-10-CM | POA: Diagnosis not present

## 2018-06-23 DIAGNOSIS — M5136 Other intervertebral disc degeneration, lumbar region: Secondary | ICD-10-CM | POA: Diagnosis not present

## 2018-06-23 DIAGNOSIS — M47816 Spondylosis without myelopathy or radiculopathy, lumbar region: Secondary | ICD-10-CM | POA: Diagnosis not present

## 2018-09-25 IMAGING — US US EXTREM LOW VENOUS*R*
1 series · 13 of 24 positions shown · non-contrast
Comparison: None.

CLINICAL DATA: Right calf erythema and swelling x2 days. History of
left-sided DVT 05/27/2016.



[Series 1: us extrem low venous*right* · 0.08mm/px · 13 of 30 slices shown]
[im 1/30]
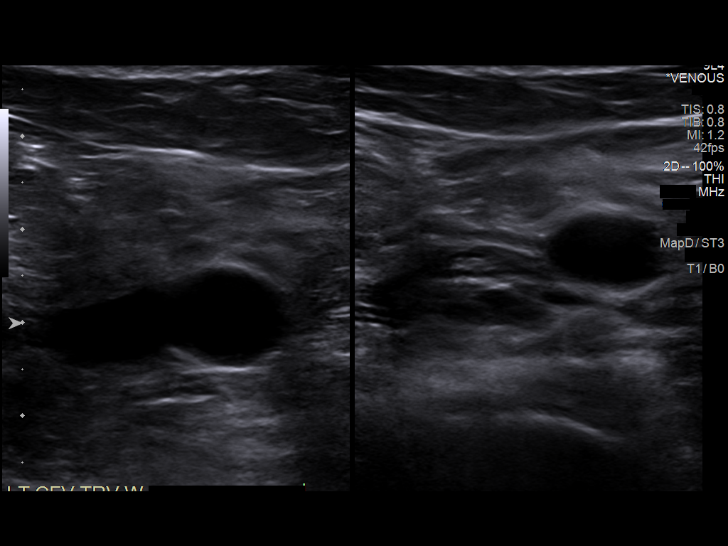
[im 3/30]
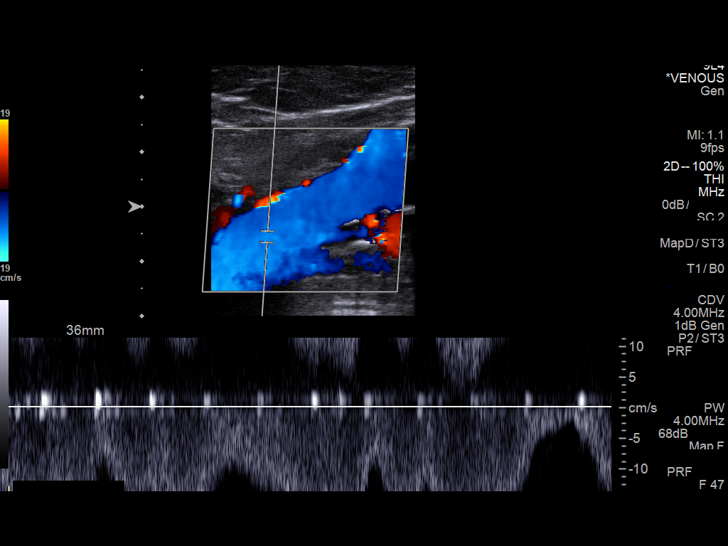
[im 6/30]
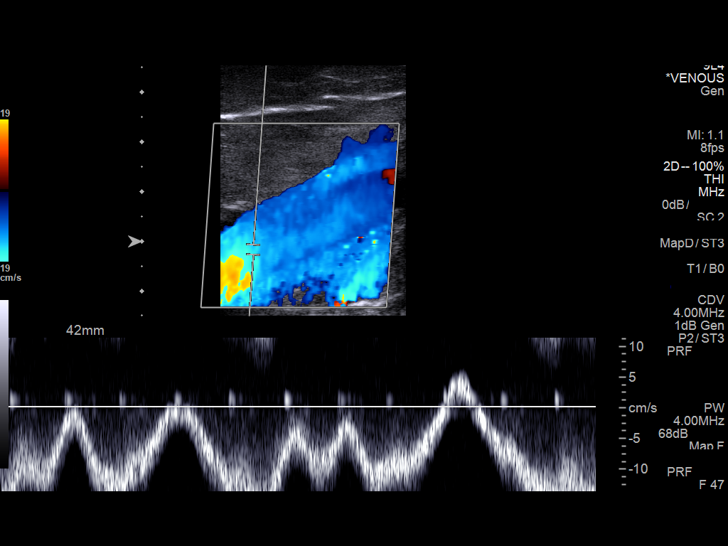
[im 8/30]
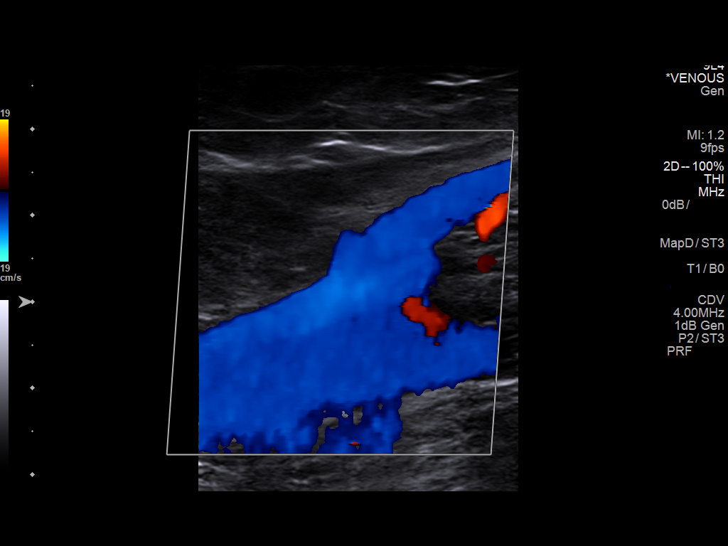
[im 11/30]
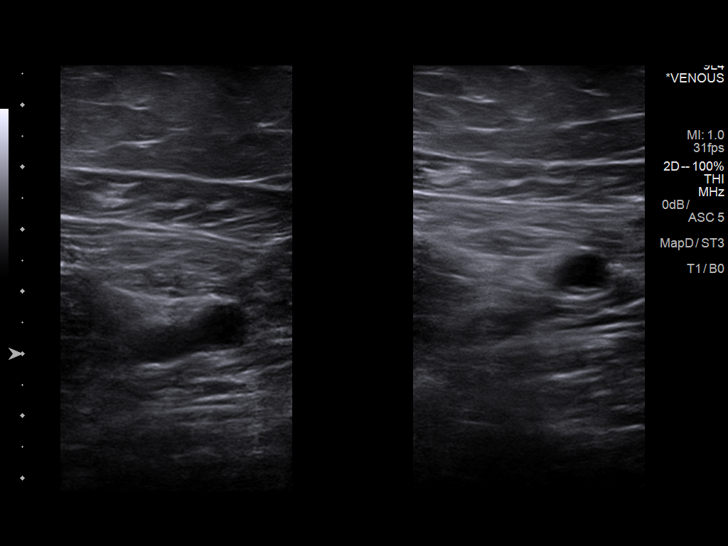
[im 13/30]
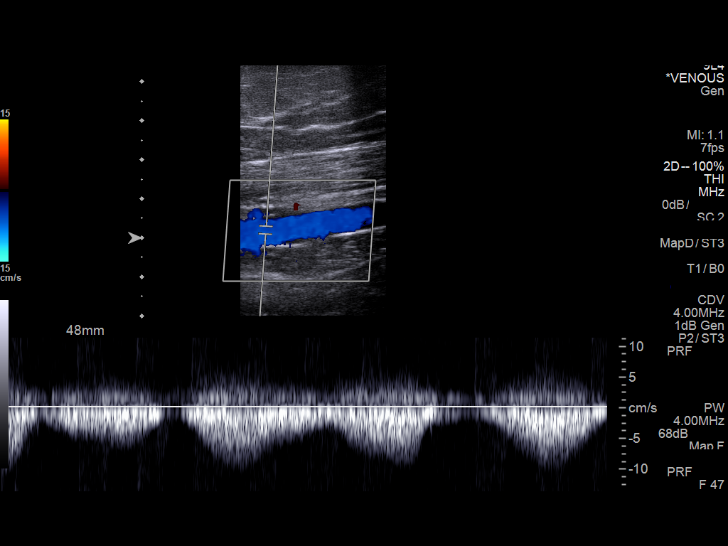
[im 16/30]
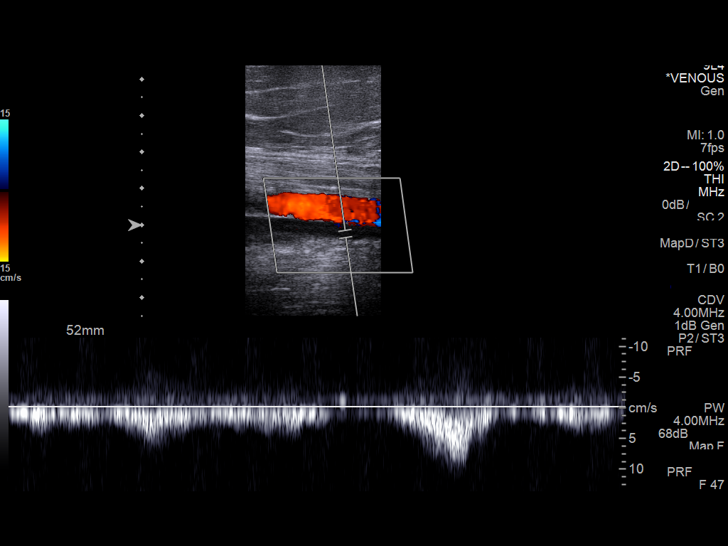
[im 17/30]
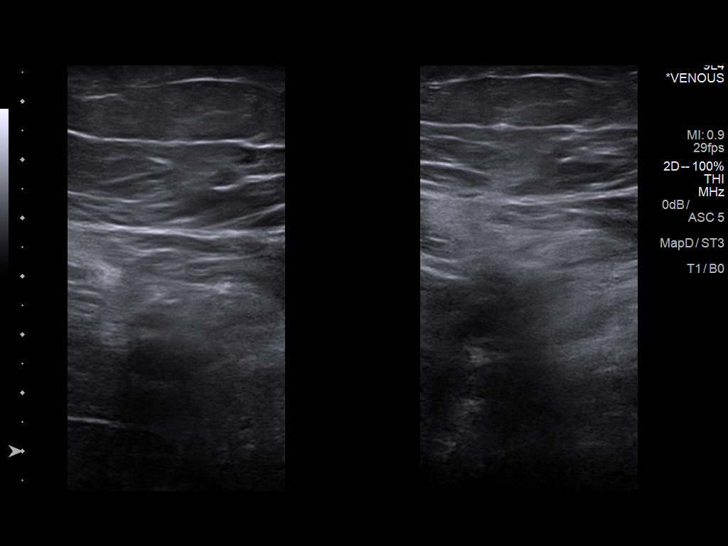
[im 19/30]
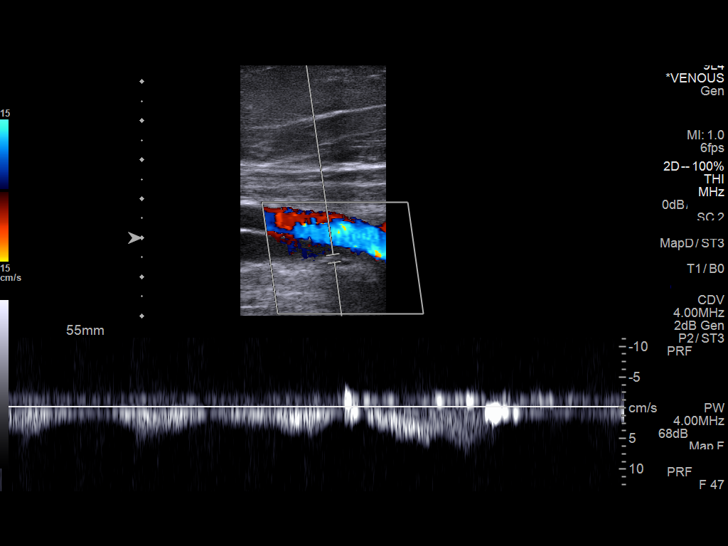
[im 22/30]
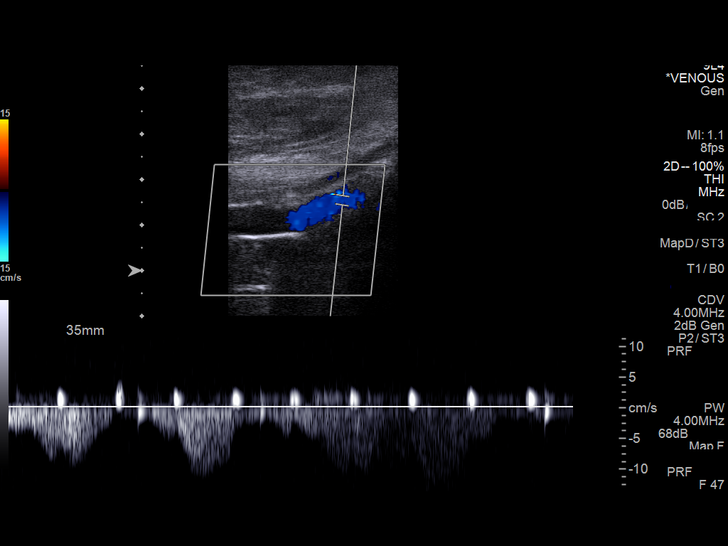
[im 24/30]
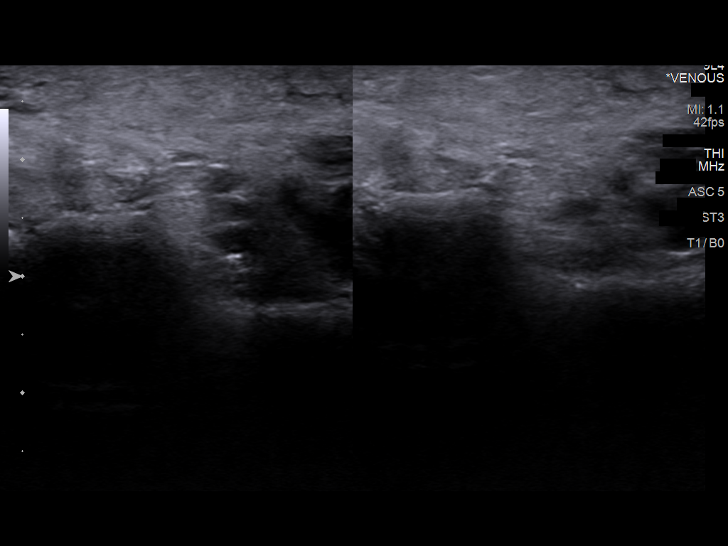
[im 27/30]
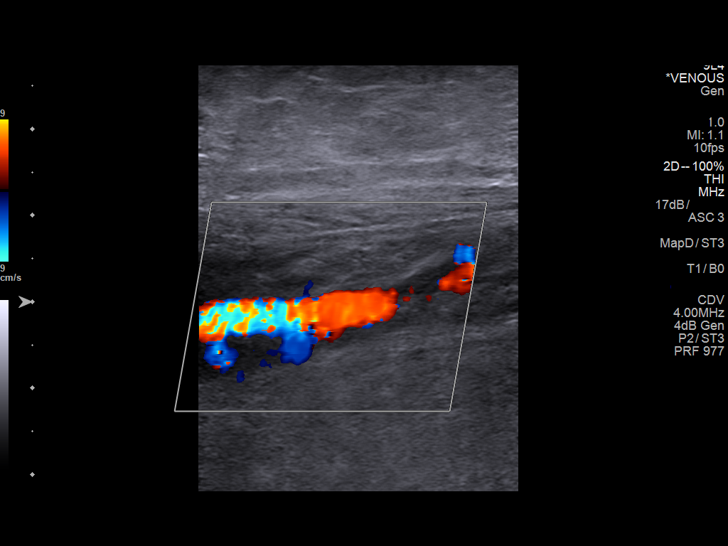
[im 30/30]
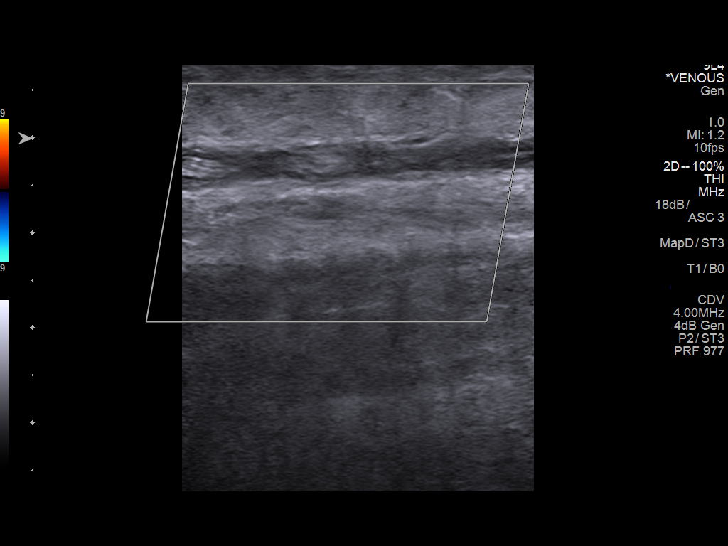

[13 of 24 positions shown; findings below may reference images not displayed]

FINDINGS: Contralateral Common Femoral Vein: Respiratory phasicity is normal
and symmetric with the symptomatic side. No evidence of thrombus.
Normal compressibility.

Common Femoral Vein: No evidence of thrombus. Normal
compressibility, respiratory phasicity and response to augmentation.

Saphenofemoral Junction: No evidence of thrombus. Normal
compressibility and flow on color Doppler imaging.

Profunda Femoral Vein: No evidence of thrombus. Normal
compressibility and flow on color Doppler imaging.

Femoral Vein: No evidence of thrombus. Normal compressibility,
respiratory phasicity and response to augmentation.

Popliteal Vein: No evidence of thrombus. Normal compressibility,
respiratory phasicity and response to augmentation.

Calf Veins: Lack of compression noted within the posterior tibial
and peroneal veins from knee to ankle and in the great saphenous
vein from mid calf to ankle.

Superficial Great Saphenous Vein: No evidence of thrombus. Normal
compressibility.

Venous Reflux:  None.

Other Findings:  None.
IMPRESSION: Calf vein thrombosis involving the posterior tibial and peroneal
veins as above from knee to ankle and within the great saphenous
vein from mid calf to ankle. These results will be called to the
ordering clinician or representative by the [HOSPITAL] at
the imaging location.

## 2018-12-08 DIAGNOSIS — Z1231 Encounter for screening mammogram for malignant neoplasm of breast: Secondary | ICD-10-CM | POA: Diagnosis not present

## 2018-12-08 DIAGNOSIS — Z0000 Encounter for general adult medical examination without abnormal findings: Secondary | ICD-10-CM | POA: Diagnosis not present

## 2018-12-08 DIAGNOSIS — E782 Mixed hyperlipidemia: Secondary | ICD-10-CM | POA: Diagnosis not present

## 2018-12-08 DIAGNOSIS — M85852 Other specified disorders of bone density and structure, left thigh: Secondary | ICD-10-CM | POA: Diagnosis not present

## 2018-12-08 DIAGNOSIS — Z1389 Encounter for screening for other disorder: Secondary | ICD-10-CM | POA: Diagnosis not present

## 2018-12-08 DIAGNOSIS — D509 Iron deficiency anemia, unspecified: Secondary | ICD-10-CM | POA: Diagnosis not present

## 2018-12-08 DIAGNOSIS — Z86718 Personal history of other venous thrombosis and embolism: Secondary | ICD-10-CM | POA: Diagnosis not present

## 2018-12-08 DIAGNOSIS — Z1211 Encounter for screening for malignant neoplasm of colon: Secondary | ICD-10-CM | POA: Diagnosis not present

## 2019-06-10 ENCOUNTER — Encounter (HOSPITAL_COMMUNITY): Payer: Self-pay | Admitting: Emergency Medicine

## 2019-06-10 ENCOUNTER — Other Ambulatory Visit: Payer: Self-pay

## 2019-06-10 ENCOUNTER — Inpatient Hospital Stay (HOSPITAL_COMMUNITY)
Admission: EM | Admit: 2019-06-10 | Discharge: 2019-06-12 | DRG: 378 | Disposition: A | Discharge status: Home / Self Care | Payer: Medicare HMO | Attending: Internal Medicine | Admitting: Internal Medicine

## 2019-06-10 DIAGNOSIS — Q399 Congenital malformation of esophagus, unspecified: Secondary | ICD-10-CM

## 2019-06-10 DIAGNOSIS — D62 Acute posthemorrhagic anemia: Secondary | ICD-10-CM | POA: Diagnosis present

## 2019-06-10 DIAGNOSIS — K922 Gastrointestinal hemorrhage, unspecified: Secondary | ICD-10-CM | POA: Diagnosis not present

## 2019-06-10 DIAGNOSIS — K5711 Diverticulosis of small intestine without perforation or abscess with bleeding: Secondary | ICD-10-CM | POA: Diagnosis not present

## 2019-06-10 DIAGNOSIS — K449 Diaphragmatic hernia without obstruction or gangrene: Secondary | ICD-10-CM | POA: Diagnosis present

## 2019-06-10 DIAGNOSIS — Z6838 Body mass index (BMI) 38.0-38.9, adult: Secondary | ICD-10-CM | POA: Diagnosis not present

## 2019-06-10 DIAGNOSIS — Z86718 Personal history of other venous thrombosis and embolism: Secondary | ICD-10-CM

## 2019-06-10 DIAGNOSIS — Z66 Do not resuscitate: Secondary | ICD-10-CM | POA: Diagnosis not present

## 2019-06-10 DIAGNOSIS — M1990 Unspecified osteoarthritis, unspecified site: Secondary | ICD-10-CM | POA: Diagnosis not present

## 2019-06-10 DIAGNOSIS — K254 Chronic or unspecified gastric ulcer with hemorrhage: Secondary | ICD-10-CM | POA: Diagnosis present

## 2019-06-10 DIAGNOSIS — K3189 Other diseases of stomach and duodenum: Secondary | ICD-10-CM | POA: Diagnosis not present

## 2019-06-10 DIAGNOSIS — Z20822 Contact with and (suspected) exposure to covid-19: Secondary | ICD-10-CM | POA: Diagnosis not present

## 2019-06-10 DIAGNOSIS — K5710 Diverticulosis of small intestine without perforation or abscess without bleeding: Secondary | ICD-10-CM | POA: Diagnosis not present

## 2019-06-10 DIAGNOSIS — Z79899 Other long term (current) drug therapy: Secondary | ICD-10-CM | POA: Diagnosis not present

## 2019-06-10 DIAGNOSIS — Z9049 Acquired absence of other specified parts of digestive tract: Secondary | ICD-10-CM | POA: Diagnosis not present

## 2019-06-10 DIAGNOSIS — R0602 Shortness of breath: Secondary | ICD-10-CM | POA: Diagnosis not present

## 2019-06-10 DIAGNOSIS — Z9842 Cataract extraction status, left eye: Secondary | ICD-10-CM

## 2019-06-10 DIAGNOSIS — I10 Essential (primary) hypertension: Secondary | ICD-10-CM | POA: Diagnosis not present

## 2019-06-10 DIAGNOSIS — K921 Melena: Secondary | ICD-10-CM | POA: Diagnosis not present

## 2019-06-10 DIAGNOSIS — K253 Acute gastric ulcer without hemorrhage or perforation: Secondary | ICD-10-CM | POA: Diagnosis not present

## 2019-06-10 DIAGNOSIS — I82503 Chronic embolism and thrombosis of unspecified deep veins of lower extremity, bilateral: Secondary | ICD-10-CM | POA: Diagnosis not present

## 2019-06-10 DIAGNOSIS — Z9841 Cataract extraction status, right eye: Secondary | ICD-10-CM

## 2019-06-10 DIAGNOSIS — E669 Obesity, unspecified: Secondary | ICD-10-CM | POA: Diagnosis present

## 2019-06-10 DIAGNOSIS — R5383 Other fatigue: Secondary | ICD-10-CM | POA: Diagnosis not present

## 2019-06-10 DIAGNOSIS — D500 Iron deficiency anemia secondary to blood loss (chronic): Secondary | ICD-10-CM | POA: Diagnosis not present

## 2019-06-10 DIAGNOSIS — Z7901 Long term (current) use of anticoagulants: Secondary | ICD-10-CM | POA: Diagnosis not present

## 2019-06-10 DIAGNOSIS — D649 Anemia, unspecified: Secondary | ICD-10-CM

## 2019-06-10 DIAGNOSIS — K5730 Diverticulosis of large intestine without perforation or abscess without bleeding: Secondary | ICD-10-CM | POA: Diagnosis not present

## 2019-06-10 DIAGNOSIS — D509 Iron deficiency anemia, unspecified: Secondary | ICD-10-CM | POA: Diagnosis present

## 2019-06-10 DIAGNOSIS — K625 Hemorrhage of anus and rectum: Secondary | ICD-10-CM | POA: Diagnosis not present

## 2019-06-10 DIAGNOSIS — N200 Calculus of kidney: Secondary | ICD-10-CM | POA: Diagnosis not present

## 2019-06-10 LAB — COMPREHENSIVE METABOLIC PANEL
ALT: 12 U/L (ref 0–44)
AST: 16 U/L (ref 15–41)
Albumin: 3.6 g/dL (ref 3.5–5.0)
Alkaline Phosphatase: 49 U/L (ref 38–126)
Anion gap: 6 (ref 5–15)
BUN: 23 mg/dL (ref 8–23)
CO2: 24 mmol/L (ref 22–32)
Calcium: 9.2 mg/dL (ref 8.9–10.3)
Chloride: 110 mmol/L (ref 98–111)
Creatinine, Ser: 0.67 mg/dL (ref 0.44–1.00)
GFR calc Af Amer: >60 mL/min (ref >60)
GFR calc non Af Amer: >60 mL/min (ref >60)
Glucose, Bld: 103 mg/dL — ABNORMAL HIGH (ref 70–99)
Potassium: 4 mmol/L (ref 3.5–5.1)
Sodium: 140 mmol/L (ref 135–145)
Total Bilirubin: 0.5 mg/dL (ref 0.3–1.2)
Total Protein: 7.5 g/dL (ref 6.5–8.1)

## 2019-06-10 LAB — CBC
HCT: 29.1 % — ABNORMAL LOW (ref 36.0–46.0)
Hemoglobin: 8.9 g/dL — ABNORMAL LOW (ref 12.0–15.0)
MCH: 24 pg — ABNORMAL LOW (ref 26.0–34.0)
MCHC: 30.6 g/dL (ref 30.0–36.0)
MCV: 78.4 fL — ABNORMAL LOW (ref 80.0–100.0)
Platelets: 331 10*3/uL (ref 150–400)
RBC: 3.71 MIL/uL — ABNORMAL LOW (ref 3.87–5.11)
RDW: 17.1 % — ABNORMAL HIGH (ref 11.5–15.5)
WBC: 11.6 10*3/uL — ABNORMAL HIGH (ref 4.0–10.5)
nRBC: 0 % (ref 0.0–0.2)

## 2019-06-10 LAB — PREPARE RBC (CROSSMATCH)

## 2019-06-10 MED ORDER — PANTOPRAZOLE SODIUM 40 MG IV SOLR
40.0000 mg | Freq: Two times a day (BID) | INTRAVENOUS | Status: DC
  Administered 2019-06-10 – 2019-06-11 (×2): 40 mg via INTRAVENOUS
  Filled 2019-06-10 (×2): qty 40

## 2019-06-10 MED ORDER — PANTOPRAZOLE SODIUM 40 MG IV SOLR: 40.0000 mg | Freq: Two times a day (BID) | INTRAVENOUS | Status: DC

## 2019-06-10 MED ORDER — SODIUM CHLORIDE 0.9 % IV SOLN: INTRAVENOUS | Status: DC

## 2019-06-10 MED ORDER — SODIUM CHLORIDE 0.9 % IV SOLN
80.0000 mg | Freq: Once | INTRAVENOUS | Status: AC
  Administered 2019-06-11: 80 mg via INTRAVENOUS
  Filled 2019-06-10: qty 80

## 2019-06-10 MED ORDER — SODIUM CHLORIDE 0.9 % IV SOLN
8.0000 mg/h | INTRAVENOUS | Status: DC
  Administered 2019-06-11: 8 mg/h via INTRAVENOUS
  Filled 2019-06-10: qty 80

## 2019-06-10 MED ORDER — SODIUM CHLORIDE 0.9% IV SOLUTION: Freq: Once | INTRAVENOUS | Status: AC

## 2019-06-10 MED ORDER — ONDANSETRON HCL 4 MG/2ML IJ SOLN: 4.0000 mg | Freq: Four times a day (QID) | INTRAMUSCULAR | Status: DC | PRN

## 2019-06-10 MED ORDER — ONDANSETRON HCL 4 MG PO TABS: 4.0000 mg | ORAL_TABLET | Freq: Four times a day (QID) | ORAL | Status: DC | PRN

## 2019-06-10 NOTE — H&P (Signed)
History and Physical   Lisa Owens [id number] DOB: [birth year] DOA: 06/10/2019  Referring MD/NP/PA: Dr. Tyrone Nine  PCP: Lawerance Cruel, MD   Outpatient Specialists: None  Patient coming from: Home  Chief Complaint: Rectal bleed  HPI: Lisa Owens is a 79 y.o. female with medical history significant of previous DVT on Eliquis, osteoarthritis, previous GI bleed in September 2019 who was seen and evaluated back pain.  Patient was told she has diverticulitis and diverticular bleed following which she was discharged home but patient never followed with GI.  She continues to do well until today when she was seen by PCP secondary to dark rectal bleed.  Patient's hemoglobin dropped to 8 g.  She is hemodynamically stable.  Was seen and evaluated in the ER.  No pain.  No nausea vomiting or diarrhea.  Denied any hematemesis.  She was previously seen by Dr. Vernice Jefferson who perform her previous EGD and colonoscopy.  No other colonoscopy since then.  Patient is being admitted to the hospital for GI bleed evaluation and treatment..  ED Course: Temperature 98.6 blood pressure 176/97 pulse 107 respirate of 18 and oxygen sat 93% on room air.  White count is 11.6 hemoglobin 8.9 and platelet count of 331.  Chemistry appeared to be within normal with glucose 103.Urinalysis is negative.  Fecal occult blood test is positive.  Patient being admitted to the hospital for evaluation and treatment  Review of Systems: As per HPI otherwise 10 point review of systems negative.    Past Medical History:  Diagnosis Date  . Arthritis   . DVT (deep venous thrombosis) ([place])   . Headache(784.0)    sinus    Past Surgical History:  Procedure Laterality Date  . BUNIONECTOMY  2001   along with hammer toe  . CHOLECYSTECTOMY  2007  . COLONOSCOPY WITH PROPOFOL N/A 01/23/2018   Procedure: COLONOSCOPY WITH PROPOFOL;  Surgeon: Clarene Essex, MD;  [facility];  Service: Endoscopy;  Laterality: N/A;  .  ESOPHAGOGASTRODUODENOSCOPY (EGD) WITH PROPOFOL N/A 01/22/2018   Procedure: ESOPHAGOGASTRODUODENOSCOPY (EGD) WITH PROPOFOL;  Surgeon: Clarene Essex, MD;  [facility];  Service: Endoscopy;  Laterality: N/A;  . EYE SURGERY  2011   bilateral cataract  . [place]  2001  . SHOULDER OPEN ROTATOR CUFF REPAIR  09/19/2011   Procedure: ROTATOR CUFF REPAIR SHOULDER OPEN;  Surgeon: Johnn Hai, MD;  [facility];  Service: Orthopedics;  Laterality: Right;  . SUBACROMIAL DECOMPRESSION  09/19/2011   Procedure: SUBACROMIAL DECOMPRESSION;  Surgeon: Johnn Hai, MD;  [facility];  Service: Orthopedics;  Laterality: Right;  . tummy tuck  2006  . UMBILICAL HERNIA REPAIR  2007   along with gallbladder     reports that she has never smoked. She has never used smokeless tobacco. She reports current alcohol use of about 1.0 standard drinks of alcohol per week. She reports that she does not use drugs.  No Known Allergies  No family history on file.   Prior to Admission medications   Medication Sig Start Date End Date Taking? Authorizing Provider  ELIQUIS 5 MG TABS tablet Take 5 mg by mouth 2 (two) times daily. 12/26/17  Yes [provider]  morphine (MSIR) 15 MG tablet Take 1 tablet (15 mg total) by mouth every 4 (four) hours as needed for severe pain. Patient not taking: Reported on 06/10/2019 04/14/18   Deno Etienne, DO  ondansetron (ZOFRAN ODT) 4 MG disintegrating tablet 4mg  ODT q4 hours prn nausea/vomit Patient not  taking: Reported on 06/10/2019 04/14/18   Deno Etienne, DO  tamsulosin (FLOMAX) 0.4 MG CAPS capsule Take 1 capsule (0.4 mg total) by mouth daily after supper. Patient not taking: Reported on 06/10/2019 04/14/18   Deno Etienne, DO    Physical Exam: Vitals:   06/10/19 1902 06/10/19 1903 06/10/19 1930 06/10/19 2030  BP: (!) 159/78 (!) 159/78 (!) 172/80 (!) 147/79  Pulse: 86 86 86 87  Resp: 19 19 18 18   Temp:      TempSrc:      SpO2: 99% 97% 100% 97%       Constitutional: Anxious but no acute distress Vitals:   06/10/19 1902 06/10/19 1903 06/10/19 1930 06/10/19 2030  BP: (!) 159/78 (!) 159/78 (!) 172/80 (!) 147/79  Pulse: 86 86 86 87  Resp: 19 19 18 18   Temp:      TempSrc:      SpO2: 99% 97% 100% 97%   Eyes: PERRL, lids and conjunctivae normal ENMT: Mucous membranes are moist. Posterior pharynx clear of any exudate or lesions.Normal dentition.  Neck: normal, supple, no masses, no thyromegaly Respiratory: clear to auscultation bilaterally, no wheezing, no crackles. Normal respiratory effort. No accessory muscle use.  Cardiovascular: Sinus tachycardia, no murmurs / rubs / gallops. No extremity edema. 2+ pedal pulses. No carotid bruits.  Abdomen: no tenderness, no masses palpated. No hepatosplenomegaly. Bowel sounds positive.  Musculoskeletal: no clubbing / cyanosis. No joint deformity upper and lower extremities. Good ROM, no contractures. Normal muscle tone.  Skin: no rashes, lesions, ulcers. No induration Neurologic: CN 2-12 grossly intact. Sensation intact, DTR normal. Strength 5/5 in all 4.  Psychiatric: Normal judgment and insight. Alert and oriented x 3. Normal mood.     Labs on Admission: I have personally reviewed following labs and imaging studies  CBC: Recent Labs  Lab 06/10/19 1849  WBC 11.6*  HGB 8.9*  HCT 29.1*  MCV 78.4*  PLT [id number]   Basic Metabolic Panel: Recent Labs  Lab 06/10/19 1849  NA 140  K 4.0  CL 110  CO2 24  GLUCOSE 103*  BUN 23  CREATININE 0.67  CALCIUM 9.2   GFR: CrCl cannot be calculated (Unknown ideal weight.). Liver Function Tests: Recent Labs  Lab 06/10/19 1849  AST 16  ALT 12  ALKPHOS 49  BILITOT 0.5  PROT 7.5  ALBUMIN 3.6   No results for input(s): LIPASE, AMYLASE in the last 168 hours. No results for input(s): AMMONIA in the last 168 hours. Coagulation Profile: No results for input(s): INR, PROTIME in the last 168 hours. Cardiac Enzymes: No results for input(s):  CKTOTAL, CKMB, CKMBINDEX, TROPONINI in the last 168 hours. BNP (last 3 results) No results for input(s): PROBNP in the last 8760 hours. HbA1C: No results for input(s): HGBA1C in the last 72 hours. CBG: No results for input(s): GLUCAP in the last 168 hours. Lipid Profile: No results for input(s): CHOL, HDL, LDLCALC, TRIG, CHOLHDL, LDLDIRECT in the last 72 hours. Thyroid Function Tests: No results for input(s): TSH, T4TOTAL, FREET4, T3FREE, THYROIDAB in the last 72 hours. Anemia Panel: No results for input(s): VITAMINB12, FOLATE, FERRITIN, TIBC, IRON, RETICCTPCT in the last 72 hours. Urine analysis:    Component Value Date/Time   COLORURINE COLORLESS (A) 04/14/2018 1936   APPEARANCEUR CLEAR 04/14/2018 1936   LABSPEC 1.029 04/14/2018 1936   PHURINE 7.0 04/14/2018 1936   GLUCOSEU 50 (A) 04/14/2018 1936   HGBUR SMALL (A) 04/14/2018 1936   BILIRUBINUR NEGATIVE 04/14/2018 [place] NEGATIVE 04/14/2018 1936  PROTEINUR NEGATIVE 04/14/2018 1936   NITRITE NEGATIVE 04/14/2018 1936   LEUKOCYTESUR NEGATIVE 04/14/2018 1936   Sepsis Labs: @LABRCNTIP (procalcitonin:4,lacticidven:4) )No results found for this or any previous visit (from the past 240 hour(s)).   Radiological Exams on Admission: No results found.   Assessment/Plan Principal Problem:   GI bleed Active Problems:   GIB (gastrointestinal bleeding)     #1 lower GI bleed: Probably diverticular bleed in the setting of anticoagulation with Eliquis.  Patient did not take Eliquis today.  She is going to be off of it for now.  IV Protonix, keep n.p.o.  GI consulted.  Serial H&H.  Transfusion of 2 units of packed red blood cells.  Patient's previous known hemoglobin in the system was 13 g in 2019.  We will follow up closely.  #2 history of DVT: Patient will be off Eliquis for now.  SCDs and close monitoring  #3 osteoarthritis: No pain at the moment.  No other symptoms.  Continue to monitor  #4 anemia of acute blood loss: As  per #1.  Patient may have had chronic blood loss with some acute worsening in the last 24 hours.   DVT prophylaxis: SCD Code Status: DNR Family Communication: No family at bedside Disposition Plan: Home Consults called: Dr. Michail Sermon Admission status: Inpatient  Severity of Illness: The appropriate patient status for this patient is INPATIENT. Inpatient status is judged to be reasonable and necessary in order to provide the required intensity of service to ensure the patient's safety. The patient's presenting symptoms, physical exam findings, and initial radiographic and laboratory data in the context of their chronic comorbidities is felt to place them at high risk for further clinical deterioration. Furthermore, it is not anticipated that the patient will be medically stable for discharge from the hospital within 2 midnights of admission. The following factors support the patient status of inpatient.   " The patient's presenting symptoms include rectal bleed. " The worrisome physical exam findings include no significant symptoms. " The initial radiographic and laboratory data are worrisome because of drop in hemoglobin. " The chronic co-morbidities include history of DVT.   * I certify that at the point of admission it is my clinical judgment that the patient will require inpatient hospital care spanning beyond 2 midnights from the point of admission due to high intensity of service, high risk for further deterioration and high frequency of surveillance required.Barbette Merino MD [facility] Pager [phone]  If 7PM-7AM, please contact night-coverage [email or web address] Password [facility]  06/10/2019, 9:29 PM

## 2019-06-10 NOTE — ED Notes (Signed)
Pt ambulatory to the restroom without assistance. Steady gait noted.  

## 2019-06-10 NOTE — ED Notes (Signed)
ED TO INPATIENT HANDOFF REPORT  Name/Age/Gender Lisa Owens 79 y.o. female  Code Status Code Status History    Date Active Date Inactive Code Status Order ID Comments User Context   01/21/2018 1412 01/23/2018 2045 DNR [id number]  Nita Sells, MD ED   Advance Care Planning Activity    Questions for Most Recent Historical Code Status (Order [id number])    Question Answer Comment   In the event of cardiac or respiratory ARREST Do not call a "code blue"    In the event of cardiac or respiratory ARREST Do not perform Intubation, CPR, defibrillation or ACLS    In the event of cardiac or respiratory ARREST Use medication by any route, position, wound care, and other measures to relive pain and suffering. May use oxygen, suction and manual treatment of airway obstruction as needed for comfort.       Home/SNF/Other Home  Chief Complaint GIB (gastrointestinal bleeding) [K92.2]  Level of Care/Admitting Diagnosis ED Disposition    ED Disposition Condition Comment   Admit  Hospital Area: [place] [[id number]]  Level of Care: Telemetry [5]  Admit to tele based on following criteria: Other see comments  Comments: gib  Covid Evaluation: Asymptomatic Screening Protocol (No Symptoms)  Diagnosis: GIB (gastrointestinal bleeding) [id number]  Admitting Physician: Elwyn Reach [[id number]]  Attending Physician: Elwyn Reach [[id number]]  Estimated length of stay: past midnight tomorrow  Certification:: I certify this patient will need inpatient services for at least 2 midnights       Medical History Past Medical History:  Diagnosis Date  . Arthritis   . DVT (deep venous thrombosis) ([place])   . Headache(784.0)    sinus    Allergies No Known Allergies  IV Location/Drains/Wounds Patient Lines/Drains/Airways Status   Active Line/Drains/Airways    Name:   Placement date:   Placement time:   Site:   Days:   Peripheral IV 06/10/19 Left Forearm   06/10/19    1849     Forearm   less than 1   Incision 09/19/11 Shoulder Right   09/19/11    0853     2821          Labs/Imaging Results for orders placed or performed during the hospital encounter of 06/10/19 (from the past 48 hour(s))  Comprehensive metabolic panel     Status: Abnormal   Collection Time: 06/10/19  6:49 PM  Result Value Ref Range   Sodium 140 135 - 145 mmol/L   Potassium 4.0 3.5 - 5.1 mmol/L   Chloride 110 98 - 111 mmol/L   CO2 24 22 - 32 mmol/L   Glucose, Bld 103 (H) 70 - 99 mg/dL   BUN 23 8 - 23 mg/dL   Creatinine, Ser 0.67 0.44 - 1.00 mg/dL   Calcium 9.2 8.9 - 10.3 mg/dL   Total Protein 7.5 6.5 - 8.1 g/dL   Albumin 3.6 3.5 - 5.0 g/dL   AST 16 15 - 41 U/L   ALT 12 0 - 44 U/L   Alkaline Phosphatase 49 38 - 126 U/L   Total Bilirubin 0.5 0.3 - 1.2 mg/dL   GFR calc non Af Amer >60 >60 mL/min   GFR calc Af Amer >60 >60 mL/min   Anion gap 6 5 - 15    Comment: Performed at [facility], [place] [address], [place], [place] [address]  CBC     Status: Abnormal   Collection Time: 06/10/19  6:49 PM  Result Value Ref Range  WBC 11.6 (H) 4.0 - 10.5 K/uL   RBC 3.71 (L) 3.87 - 5.11 MIL/uL   Hemoglobin 8.9 (L) 12.0 - 15.0 g/dL   HCT 29.1 (L) 36.0 - 46.0 %   MCV 78.4 (L) 80.0 - 100.0 fL   MCH 24.0 (L) 26.0 - 34.0 pg   MCHC 30.6 30.0 - 36.0 g/dL   RDW 17.1 (H) 11.5 - 15.5 %   Platelets 331 150 - 400 K/uL   nRBC 0.0 0.0 - 0.2 %    Comment: Performed at [facility], [place] [address], [place], [place] [address]  Type and screen [place]     Status: None   Collection Time: 06/10/19  6:49 PM  Result Value Ref Range   ABO/RH(D) A POS    Antibody Screen NEG    Sample Expiration      06/13/2019,2359 Performed at [facility], [place], [place] [address], [place], [place] [address]    No results found.  Pending Labs Unresulted Labs (From admission, onward)    Start     Ordered   06/10/19 2107  Prepare RBC  (Adult Blood  Administration - Red Blood Cells)  Once,   R    Question Answer Comment  # of Units 1 unit   Transfusion Indications Symptomatic Anemia   If emergent release call blood bank Not emergent release      06/10/19 2107   06/10/19 2025  SARS CORONAVIRUS 2 (TAT 6-24 HRS) Nasopharyngeal Nasopharyngeal Swab  (Tier 3 (TAT 6-24 hrs))  Once,   STAT    Question Answer Comment  Is this test for diagnosis or screening Screening   Symptomatic for COVID-19 as defined by CDC No   Hospitalized for COVID-19 No   Admitted to ICU for COVID-19 No   Previously tested for COVID-19 No   Resident in a congregate (group) care setting No   Employed in healthcare setting No   Pregnant No      06/10/19 2024   Signed and Held  Comprehensive metabolic panel  Tomorrow morning,   R     Signed and Held   Signed and Held  CBC  Now then every 6 hours,   R     Signed and Held          Vitals/Pain Today's Vitals   06/10/19 2030 06/10/19 2100 06/10/19 2130 06/10/19 2200  BP: (!) 147/79 (!) 143/85 (!) 147/80 (!) 154/83  Pulse: 87 80 83 80  Resp: 18 18 18 18   Temp:      TempSrc:      SpO2: 97% 98% 93% 100%  PainSc:        Isolation Precautions No active isolations  Medications Medications  pantoprazole (PROTONIX) injection 40 mg (has no administration in time range)  0.9 %  sodium chloride infusion (Manually program via Guardrails IV Fluids) (has no administration in time range)    Mobility walks

## 2019-06-10 NOTE — ED Triage Notes (Addendum)
Per pt, states rectal bleeding possibly due from diverticulitis-states she has been hospitalized 1 year ago for same-states she was never told to follow up with GI, never told to make adjustments to diet-states SOB upon exertion due to low Hgb-patient was told not to take night time dose of Eliquist

## 2019-06-10 NOTE — ED Notes (Signed)
Per PCP-states patient has been having dark stools and some SOB since yesterday-states Hgb 8.0-states had a similar episode in September of 2019

## 2019-06-10 NOTE — ED Provider Notes (Signed)
[facility] DEPT Provider Note   CSN: [id number] Arrival date & time: 06/10/19  [id number]     History Chief Complaint  Patient presents with  . Rectal Bleeding    Lisa Owens is a 79 y.o. female.  78 yo F with a cc of dark stools.  Going on since yesterday.  No abdominal pain, no vomiting no fevers.  Hx of similar in the past was told she had diverticulitis.    On eliquis.  Not taken today.  Feeling weak, went to family doc.  Hgb low sent to the ed for admission.   The history is provided by the patient.  Rectal Bleeding Quality:  Black and tarry Amount:  Moderate Duration:  2 days Timing:  Constant Chronicity:  New Similar prior episodes: no   Relieved by:  Nothing Worsened by:  Nothing Ineffective treatments:  None tried Associated symptoms: no dizziness, no fever and no vomiting        Past Medical History:  Diagnosis Date  . Arthritis   . DVT (deep venous thrombosis) ([place])   . Headache(784.0)    sinus    Patient Active Problem List   Diagnosis Date Noted  . Melena 01/21/2018  . Gastric bleed 01/21/2018  . GI bleed 01/21/2018    Past Surgical History:  Procedure Laterality Date  . BUNIONECTOMY  2001   along with hammer toe  . CHOLECYSTECTOMY  2007  . COLONOSCOPY WITH PROPOFOL N/A 01/23/2018   Procedure: COLONOSCOPY WITH PROPOFOL;  Surgeon: Clarene Essex, MD;  [facility];  Service: Endoscopy;  Laterality: N/A;  . ESOPHAGOGASTRODUODENOSCOPY (EGD) WITH PROPOFOL N/A 01/22/2018   Procedure: ESOPHAGOGASTRODUODENOSCOPY (EGD) WITH PROPOFOL;  Surgeon: Clarene Essex, MD;  [facility];  Service: Endoscopy;  Laterality: N/A;  . EYE SURGERY  2011   bilateral cataract  . [place]  2001  . SHOULDER OPEN ROTATOR CUFF REPAIR  09/19/2011   Procedure: ROTATOR CUFF REPAIR SHOULDER OPEN;  Surgeon: Johnn Hai, MD;  [facility];  Service: Orthopedics;  Laterality: Right;  . SUBACROMIAL DECOMPRESSION  09/19/2011    Procedure: SUBACROMIAL DECOMPRESSION;  Surgeon: Johnn Hai, MD;  [facility];  Service: Orthopedics;  Laterality: Right;  . tummy tuck  2006  . UMBILICAL HERNIA REPAIR  2007   along with gallbladder     OB History   No obstetric history on file.     No family history on file.  Social History   Tobacco Use  . Smoking status: Never Smoker  . Smokeless tobacco: Never Used  Substance Use Topics  . Alcohol use: Yes    Alcohol/week: 1.0 standard drinks    Types: 1 Glasses of wine per week    Comment: socially  . Drug use: No    Home Medications Prior to Admission medications   Medication Sig Start Date End Date Taking? Authorizing Provider  ELIQUIS 5 MG TABS tablet Take 5 mg by mouth 2 (two) times daily. 12/26/17  Yes [provider]  morphine (MSIR) 15 MG tablet Take 1 tablet (15 mg total) by mouth every 4 (four) hours as needed for severe pain. Patient not taking: Reported on 06/10/2019 04/14/18   Deno Etienne, DO  ondansetron (ZOFRAN ODT) 4 MG disintegrating tablet 4mg  ODT q4 hours prn nausea/vomit Patient not taking: Reported on 06/10/2019 04/14/18   Deno Etienne, DO  tamsulosin (FLOMAX) 0.4 MG CAPS capsule Take 1 capsule (0.4 mg total) by mouth daily after supper. Patient not taking: Reported  on 06/10/2019 04/14/18   Deno Etienne, DO    Allergies    Patient has no known allergies.  Review of Systems   Review of Systems  Constitutional: Positive for fatigue. Negative for chills and fever.  HENT: Negative for congestion and rhinorrhea.   Eyes: Negative for redness and visual disturbance.  Respiratory: Negative for shortness of breath and wheezing.   Cardiovascular: Negative for chest pain and palpitations.  Gastrointestinal: Positive for blood in stool and hematochezia. Negative for nausea and vomiting.  Genitourinary: Negative for dysuria and urgency.  Musculoskeletal: Negative for arthralgias and myalgias.  Skin: Negative for pallor and wound.    Neurological: Negative for dizziness and headaches.    Physical Exam Updated Vital Signs BP (!) 147/79   Pulse 87   Temp 98.1 F (36.7 C) (Oral)   Resp 18   SpO2 97%   Physical Exam Vitals and nursing note reviewed.  Constitutional:      General: She is not in acute distress.    Appearance: She is well-developed. She is not diaphoretic.     Comments: Pale  HENT:     Head: Normocephalic and atraumatic.  Eyes:     Pupils: Pupils are equal, round, and reactive to light.  Cardiovascular:     Rate and Rhythm: Normal rate and regular rhythm.     Heart sounds: No murmur. No friction rub. No gallop.   Pulmonary:     Effort: Pulmonary effort is normal.     Breath sounds: No wheezing or rales.  Abdominal:     General: There is no distension.     Palpations: Abdomen is soft.     Tenderness: There is no abdominal tenderness.     Comments: Benign abdominal exam  Musculoskeletal:        General: No tenderness.     Cervical back: Normal range of motion and neck supple.  Skin:    General: Skin is warm and dry.  Neurological:     Mental Status: She is alert and oriented to person, place, and time.  Psychiatric:        Behavior: Behavior normal.     ED Results / Procedures / Treatments   Labs (all labs ordered are listed, but only abnormal results are displayed) Labs Reviewed  COMPREHENSIVE METABOLIC PANEL - Abnormal; Notable for the following components:      Result Value   Glucose, Bld 103 (*)    All other components within normal limits  CBC - Abnormal; Notable for the following components:   WBC 11.6 (*)    RBC 3.71 (*)    Hemoglobin 8.9 (*)    HCT 29.1 (*)    MCV 78.4 (*)    MCH 24.0 (*)    RDW 17.1 (*)    All other components within normal limits  SARS CORONAVIRUS 2 (TAT 6-24 HRS)  POC OCCULT BLOOD, ED  TYPE AND SCREEN    EKG None  Radiology No results found.  Procedures Procedures (including critical care time)  Medications Ordered in ED Medications   pantoprazole (PROTONIX) injection 40 mg (has no administration in time range)    ED Course  I have reviewed the triage vital signs and the nursing notes.  Pertinent labs & imaging results that were available during my care of the patient were reviewed by me and considered in my medical decision making (see chart for details).    MDM Rules/Calculators/A&P  79 yo F with a chief complaints of dark stool.  Going on for about 48 hours.  Associated with some generalized fatigue and pallor.  Went to see her family doctor and found to have a hemoglobin drop.  Last hemoglobin in our system was 13 down to 8.9.  Discussed with Dr. Pamalee Leyden GI recommended hospitalist admission and they will evaluate tomorrow.  CRITICAL CARE Performed by: Cecilio Asper   Total critical care time: 35 minutes  Critical care time was exclusive of separately billable procedures and treating other patients.  Critical care was necessary to treat or prevent imminent or life-threatening deterioration.  Critical care was time spent personally by me on the following activities: development of treatment plan with patient and/or surrogate as well as nursing, discussions with consultants, evaluation of patient's response to treatment, examination of patient, obtaining history from patient or surrogate, ordering and performing treatments and interventions, ordering and review of laboratory studies, ordering and review of radiographic studies, pulse oximetry and re-evaluation of patient's condition.   The patients results and plan were reviewed and discussed.   Any x-rays performed were independently reviewed by myself.   Differential diagnosis were considered with the presenting HPI.  Medications  pantoprazole (PROTONIX) injection 40 mg (has no administration in time range)    Vitals:   06/10/19 1902 06/10/19 1903 06/10/19 1930 06/10/19 2030  BP: (!) 159/78 (!) 159/78 (!) 172/80 (!)  147/79  Pulse: 86 86 86 87  Resp: 19 19 18 18   Temp:      TempSrc:      SpO2: 99% 97% 100% 97%    Final diagnoses:  Acute GI bleeding  Symptomatic anemia    Admission/ observation were discussed with the admitting physician, patient and/or family and they are comfortable with the plan.   Final Clinical Impression(s) / ED Diagnoses Final diagnoses:  Acute GI bleeding  Symptomatic anemia    Rx / DC Orders ED Discharge Orders    None       Deno Etienne, DO 06/10/19 2101

## 2019-06-11 ENCOUNTER — Inpatient Hospital Stay (HOSPITAL_COMMUNITY): Payer: Medicare HMO | Admitting: Certified Registered"

## 2019-06-11 ENCOUNTER — Inpatient Hospital Stay (HOSPITAL_COMMUNITY): Payer: Medicare HMO

## 2019-06-11 ENCOUNTER — Encounter (HOSPITAL_COMMUNITY)
Admission: EM | Disposition: A | Discharge status: Home / Self Care | Payer: Self-pay | Source: Home / Self Care | Attending: Internal Medicine

## 2019-06-11 DIAGNOSIS — M1990 Unspecified osteoarthritis, unspecified site: Secondary | ICD-10-CM

## 2019-06-11 DIAGNOSIS — K625 Hemorrhage of anus and rectum: Secondary | ICD-10-CM

## 2019-06-11 DIAGNOSIS — I10 Essential (primary) hypertension: Secondary | ICD-10-CM

## 2019-06-11 DIAGNOSIS — I82503 Chronic embolism and thrombosis of unspecified deep veins of lower extremity, bilateral: Secondary | ICD-10-CM

## 2019-06-11 DIAGNOSIS — K253 Acute gastric ulcer without hemorrhage or perforation: Secondary | ICD-10-CM

## 2019-06-11 HISTORY — PX: ESOPHAGOGASTRODUODENOSCOPY (EGD) WITH PROPOFOL: SHX5813

## 2019-06-11 LAB — CBC
HCT: 29.2 % — ABNORMAL LOW (ref 36.0–46.0)
Hemoglobin: 8.7 g/dL — ABNORMAL LOW (ref 12.0–15.0)
MCH: 23.5 pg — ABNORMAL LOW (ref 26.0–34.0)
MCHC: 29.8 g/dL — ABNORMAL LOW (ref 30.0–36.0)
MCV: 78.9 fL — ABNORMAL LOW (ref 80.0–100.0)
Platelets: 287 10*3/uL (ref 150–400)
RBC: 3.7 MIL/uL — ABNORMAL LOW (ref 3.87–5.11)
RDW: 16.8 % — ABNORMAL HIGH (ref 11.5–15.5)
WBC: 9.5 10*3/uL (ref 4.0–10.5)
nRBC: 0 % (ref 0.0–0.2)

## 2019-06-11 LAB — TYPE AND SCREEN
ABO/RH(D): A POS
Antibody Screen: NEGATIVE
Unit division: 0

## 2019-06-11 LAB — COMPREHENSIVE METABOLIC PANEL
ALT: 10 U/L (ref 0–44)
AST: 13 U/L — ABNORMAL LOW (ref 15–41)
Albumin: 3.2 g/dL — ABNORMAL LOW (ref 3.5–5.0)
Alkaline Phosphatase: 45 U/L (ref 38–126)
Anion gap: 6 (ref 5–15)
BUN: 19 mg/dL (ref 8–23)
CO2: 23 mmol/L (ref 22–32)
Calcium: 8.6 mg/dL — ABNORMAL LOW (ref 8.9–10.3)
Chloride: 111 mmol/L (ref 98–111)
Creatinine, Ser: 0.73 mg/dL (ref 0.44–1.00)
GFR calc Af Amer: >60 mL/min (ref >60)
GFR calc non Af Amer: >60 mL/min (ref >60)
Glucose, Bld: 102 mg/dL — ABNORMAL HIGH (ref 70–99)
Potassium: 3.9 mmol/L (ref 3.5–5.1)
Sodium: 140 mmol/L (ref 135–145)
Total Bilirubin: 0.9 mg/dL (ref 0.3–1.2)
Total Protein: 6.4 g/dL — ABNORMAL LOW (ref 6.5–8.1)

## 2019-06-11 LAB — BPAM RBC
Blood Product Expiration Date: 202102152359
ISSUE DATE / TIME: 202101282352
Unit Type and Rh: 6200

## 2019-06-11 LAB — HEMOGLOBIN AND HEMATOCRIT, BLOOD
HCT: 29.9 % — ABNORMAL LOW (ref 36.0–46.0)
Hemoglobin: 9.1 g/dL — ABNORMAL LOW (ref 12.0–15.0)

## 2019-06-11 LAB — SARS CORONAVIRUS 2 (TAT 6-24 HRS): SARS Coronavirus 2: NEGATIVE

## 2019-06-11 SURGERY — ESOPHAGOGASTRODUODENOSCOPY (EGD) WITH PROPOFOL
Anesthesia: Monitor Anesthesia Care

## 2019-06-11 MED ORDER — LIDOCAINE 2% (20 MG/ML) 5 ML SYRINGE
INTRAMUSCULAR | Status: DC | PRN
  Administered 2019-06-11: 40 mg via INTRAVENOUS

## 2019-06-11 MED ORDER — PROPOFOL 500 MG/50ML IV EMUL
INTRAVENOUS | Status: DC | PRN
  Administered 2019-06-11: 125 ug/kg/min via INTRAVENOUS

## 2019-06-11 MED ORDER — PANTOPRAZOLE SODIUM 40 MG PO TBEC
40.0000 mg | DELAYED_RELEASE_TABLET | Freq: Every day | ORAL | Status: DC
  Administered 2019-06-12: 40 mg via ORAL
  Filled 2019-06-11 (×2): qty 1

## 2019-06-11 MED ORDER — SODIUM CHLORIDE (PF) 0.9 % IJ SOLN
INTRAMUSCULAR | Status: AC
  Filled 2019-06-11: qty 50

## 2019-06-11 MED ORDER — SODIUM CHLORIDE 0.9 % IV SOLN: INTRAVENOUS | Status: DC

## 2019-06-11 MED ORDER — PROPOFOL 10 MG/ML IV BOLUS
INTRAVENOUS | Status: DC | PRN
  Administered 2019-06-11: 20 mg via INTRAVENOUS

## 2019-06-11 MED ORDER — PROPOFOL 500 MG/50ML IV EMUL
INTRAVENOUS | Status: AC
  Filled 2019-06-11: qty 50

## 2019-06-11 MED ORDER — AMLODIPINE BESYLATE 5 MG PO TABS
2.5000 mg | ORAL_TABLET | Freq: Every day | ORAL | Status: DC
  Administered 2019-06-11 – 2019-06-12 (×2): 2.5 mg via ORAL
  Filled 2019-06-11 (×2): qty 1

## 2019-06-11 MED ORDER — IOHEXOL 9 MG/ML PO SOLN
500.0000 mL | ORAL | Status: AC
  Administered 2019-06-11 (×2): 500 mL via ORAL

## 2019-06-11 MED ORDER — IOHEXOL 9 MG/ML PO SOLN
ORAL | Status: AC
  Filled 2019-06-11: qty 1000

## 2019-06-11 MED ORDER — IOHEXOL 300 MG/ML  SOLN
100.0000 mL | Freq: Once | INTRAMUSCULAR | Status: AC | PRN
  Administered 2019-06-11: 100 mL via INTRAVENOUS

## 2019-06-11 SURGICAL SUPPLY — 14 items

## 2019-06-11 NOTE — Progress Notes (Signed)
While rounding floor I was invited in to talk to pt. Lisa Owens was awake sipping on a drink. I offered Lisa Owens space to voice concerns. She said she felt ok and was preparing for a procedure soon, but not sure what will happen next. She shared stories of her time living in [place] and her life here in the [place]. She shared other stories about her family dynamics and her appreciation for her support back home when she is ready to go home. She was appreciative of the chaplain presence. She requested I visit her friend in room 1314.  Chaplain Resident  Fidel Levy MA [phone]

## 2019-06-11 NOTE — Progress Notes (Signed)
PROGRESS NOTE    Lisa Owens  [id number]  DOB: 1940/06/27  PCP: Lawerance Cruel, MD Admit date:06/10/2019  79 y.o. female with medical history significant of previous DVT on Eliquis, osteoarthritis, GI bleed (diverticular) in September 2019 who never f/u GI after discharge, now presented to PCP with c/o complaints of rectal bleeding and associated ABLA.  ED Course: Afebrile, BP 176/97 pulse 107 respirate of 18 and oxygen sat 93% on room air.  White count is 11.6 hemoglobin 8.9 and platelet count of 331. Patient's previous known hemoglobin in the system was 13 g in 2019. Chemistry appeared to be within normal with glucose 103.Urinalysis is negative.  Fecal occult blood test is positive Hospital course: Patient admitted to [facility] for further evaluation and management of possible recurrent diverticular bleed. Last EGD/Colonoscopy in 01/2018 by Dr Watt Climes revealed J shaped stomach with hiatal hernia, small gastric polyp as well as a medium non-bleeding diverticulum was found in the second portion of the duodenum. Colonoscopy notable for external and internal hemorrhoids, diverticulosis in the sigmoid colon,descending colon and in the transverse colon.  Subjective:  Patient underwent EGD this morning with no active bleeding noted.  She is resting comfortably.  Patient reports melena at home but denies any hematochezia.  She states she takes Eliquis for recurrent DVT.  Objective: Vitals:   06/10/19 2351 06/11/19 0016 06/11/19 0257 06/11/19 0625  BP: (!) 161/94 (!) 144/74 (!) 152/72 (!) 152/76  Pulse: 89 87 81 79  Resp: 18 18 18 20   Temp: 98.6 F (37 C) 98.5 F (36.9 C) 98.6 F (37 C) 98.7 F (37.1 C)  TempSrc: Oral Oral Oral Oral  SpO2: 100% 96% 95% 96%  Weight:      Height:        Intake/Output Summary (Last 24 hours) at 06/11/2019 0802 Last data filed at 06/11/2019 0350 Gross per 24 hour  Intake 516.28 ml  Output -  Net 516.28 ml   Filed Weights   06/10/19 2243  Weight: 99 kg     Physical Examination:  General exam: Appears calm and comfortable  Respiratory system: Clear to auscultation. Respiratory effort normal. Cardiovascular system: S1 & S2 heard, RRR. No JVD, murmurs, rubs, gallops or clicks. No pedal edema. Gastrointestinal system: Abdomen is nondistended, soft and nontender. Normal bowel sounds heard. Central nervous system: Alert and oriented. No new focal neurological deficits. Extremities: No contractures, edema or joint deformities.  Skin: No rashes, lesions or ulcers Psychiatry: Judgement and insight appear normal. Mood & affect appropriate.   Data Reviewed: I have personally reviewed following labs and imaging studies  CBC: Recent Labs  Lab 06/10/19 1849 06/11/19 0528  WBC 11.6* 9.5  HGB 8.9* 8.7*  HCT 29.1* 29.2*  MCV 78.4* 78.9*  PLT 331 [id number]   Basic Metabolic Panel: Recent Labs  Lab 06/10/19 1849 06/11/19 0528  NA 140 140  K 4.0 3.9  CL 110 111  CO2 24 23  GLUCOSE 103* 102*  BUN 23 19  CREATININE 0.67 0.73  CALCIUM 9.2 8.6*   GFR: Estimated Creatinine Clearance: 63.9 mL/min (by C-G formula based on SCr of 0.73 mg/dL). Liver Function Tests: Recent Labs  Lab 06/10/19 1849 06/11/19 0528  AST 16 13*  ALT 12 10  ALKPHOS 49 45  BILITOT 0.5 0.9  PROT 7.5 6.4*  ALBUMIN 3.6 3.2*   No results for input(s): LIPASE, AMYLASE in the last 168 hours. No results for input(s): AMMONIA in the last 168 hours. Coagulation Profile: No results for input(s): INR,  PROTIME in the last 168 hours. Cardiac Enzymes: No results for input(s): CKTOTAL, CKMB, CKMBINDEX, TROPONINI in the last 168 hours. BNP (last 3 results) No results for input(s): PROBNP in the last 8760 hours. HbA1C: No results for input(s): HGBA1C in the last 72 hours. CBG: No results for input(s): GLUCAP in the last 168 hours. Lipid Profile: No results for input(s): CHOL, HDL, LDLCALC, TRIG, CHOLHDL, LDLDIRECT in the last 72 hours. Thyroid Function Tests: No results  for input(s): TSH, T4TOTAL, FREET4, T3FREE, THYROIDAB in the last 72 hours. Anemia Panel: No results for input(s): VITAMINB12, FOLATE, FERRITIN, TIBC, IRON, RETICCTPCT in the last 72 hours. Sepsis Labs: No results for input(s): PROCALCITON, LATICACIDVEN in the last 168 hours.  Recent Results (from the past 240 hour(s))  SARS CORONAVIRUS 2 (TAT 6-24 HRS) Nasopharyngeal Nasopharyngeal Swab     Status: None   Collection Time: 06/10/19  9:02 PM   Specimen: Nasopharyngeal Swab  Result Value Ref Range Status   SARS Coronavirus 2 NEGATIVE NEGATIVE Final    Comment: (NOTE) SARS-CoV-2 target nucleic acids are NOT DETECTED. The SARS-CoV-2 RNA is generally detectable in upper and lower respiratory specimens during the acute phase of infection. Negative results do not preclude SARS-CoV-2 infection, do not rule out co-infections with other pathogens, and should not be used as the sole basis for treatment or other patient management decisions. Negative results must be combined with clinical observations, patient history, and epidemiological information. The expected result is Negative. Fact Sheet for Patients: SugarRoll.be Fact Sheet for Healthcare Providers: https://www.woods-mathews.com/ This test is not yet approved or cleared by the [place] FDA and  has been authorized for detection and/or diagnosis of SARS-CoV-2 by FDA under an Emergency Use Authorization (EUA). This EUA will remain  in effect (meaning this test can be used) for the duration of the COVID-19 declaration under Section 56 4(b)(1) of the Act, 21 U.S.C. section 360bbb-3(b)(1), unless the authorization is terminated or revoked sooner. Performed at [facility], [place] [address], [place], [place] [address]       Radiology Studies: No results found.      Scheduled Meds: . pantoprazole (PROTONIX) IV  40 mg Intravenous Q12H  . [START ON 06/14/2019] pantoprazole  40 mg  Intravenous Q12H   Continuous Infusions: . sodium chloride 100 mL/hr at 06/11/19 0303  . pantoprozole (PROTONIX) infusion 8 mg/hr (06/11/19 0350)    Assessment & Plan:   #1 Upper GI bleed with acute blood loss anemia:  Patient now reports only melena with no hematochezia at home.  Patient admitted with IV fluids/n.p.o. status.  Seen by GI and underwent EGD today revealing findings similar to prior EGD-hiatal hernia,non-bleeding duodenal diverticulum, also showed erosive gastropathy i.e. Lysbeth Galas lesions with no stigmata of recent bleeding. Patient did not take Eliquis yesterday.  Hemoglobin on presentation was 8.9  (hemoglobin in 2019 was 13)--> it appears that patient received 1 unit PRBC overnight with no significant improvement in hemoglobin (8.7 today) but no active bleeding noted clinically or on EGD.  GI has ordered CT abdomen to rule out major pathologies.  Continue IV PPI BID--GI recommends Protonix 40 mg daily indefinitely upon discharge and follow-up in their clinic in 4 weeks.  Clear liquid diet and advance as tolerated.  #2 history of recurrent DVT: Patient off Eliquis for now.  Patient apparently recommended lifelong therapy  given recurrence of DVT while off anticoagulation after completing treatment for the first episode.  She is not sure of any particular hypercoagulability diagnosis.  Will  discuss with GI regarding appropriate timing for reinitiating anticoagulation once CT results also available.  Will obtain Doppler lower extremity to rule out acute/residual clots.  May  need inpatient monitoring with IV heparin if immediate resumption of anticoagulation recommended.  #3 Osteoarthritis: No acute issues. Avoid NSAIDs Continue to monitor  #4:   History of diverticular bleed: Patient now reports only melena.  She had colonoscopy in September 2019.   #5: Hypertension: Patient appears to have elevated blood pressure with systolic pressures 1 [id number] 60.  Will add low-dose Norvasc.  Not  sure why she is on tamsulosin at home.  DVT prophylaxis: SCDs given GI bleed (rule out residual clots-changed to TED hose for now) Code Status: DNR Family / Patient Communication: Discussed with patient in detail. Disposition Plan: Home in a.m. if and when medically cleared.  Repeat H&H in the morning     LOS: 1 day    Time spent: 35 minutes    Guilford Shi, MD [facility] Pager [phone]  If 7PM-7AM, please contact night-coverage [email or web address] Password [email or web address] 06/11/2019, 8:02 AM

## 2019-06-11 NOTE — Progress Notes (Signed)
Pt states she started Eliquis Oct 2017 and July 2019.  SRP,RN

## 2019-06-11 NOTE — Anesthesia Procedure Notes (Signed)
Procedure Name: MAC Date/Time: 06/11/2019 10:50 AM Performed by: Eben Burow, CRNA Pre-anesthesia Checklist: Patient identified, Emergency Drugs available, Suction available, Patient being monitored and Timeout performed Oxygen Delivery Method: Simple face mask Dental Injury: Teeth and Oropharynx as per pre-operative assessment

## 2019-06-11 NOTE — Consult Note (Addendum)
Referring Provider: Dr. Earnest Conroy Primary Care Physician:  Lawerance Cruel, MD Primary Gastroenterologist:  Dr. Watt Climes  Reason for Consultation:  Melena  HPI: Lisa Owens is a 79 y.o. female on Eliquis (last dose yesterday morning) for DVT had acute onset of black stools starting on Wed with one episode and again on Thursday. Stool was formed. Denies hematochezia. Denies abdominal pain, nausea, vomiting, dizziness. Had fatigue and SOB. History of iron deficiency anemia. She was admitted in 01/2018 and EGD showed a medium-sized hiatal hernia and a small gastric polyp. Colonoscopy showed hemorrhoids and diverticulosis. Hgb 8.7 (13.6 in 04/2018). COVID negative.  Past Medical History:  Diagnosis Date  . Arthritis   . Diverticulitis 01/2018  . DVT (deep venous thrombosis) ([place])     Past Surgical History:  Procedure Laterality Date  . BUNIONECTOMY  2001   along with hammer toe  . CHOLECYSTECTOMY  2007  . COLONOSCOPY WITH PROPOFOL N/A 01/23/2018   Procedure: COLONOSCOPY WITH PROPOFOL;  Surgeon: Clarene Essex, MD;  Location: [facility]COPY;  Service: Endoscopy;  Laterality: N/A;  . ESOPHAGOGASTRODUODENOSCOPY (EGD) WITH PROPOFOL N/A 01/22/2018   Procedure: ESOPHAGOGASTRODUODENOSCOPY (EGD) WITH PROPOFOL;  Surgeon: Clarene Essex, MD;  Location: [facility];  Service: Endoscopy;  Laterality: N/A;  . EYE SURGERY  2011   bilateral cataract  . [place]  2001  . SHOULDER OPEN ROTATOR CUFF REPAIR  09/19/2011   Procedure: ROTATOR CUFF REPAIR SHOULDER OPEN;  Surgeon: Johnn Hai, MD;  Location: [facility] OR[facility];  Service: Orthopedics;  Laterality: Right;  . SUBACROMIAL DECOMPRESSION  09/19/2011   Procedure: SUBACROMIAL DECOMPRESSION;  Surgeon: Johnn Hai, MD;  Location: [facility];  Service: Orthopedics;  Laterality: Right;  . tummy tuck  2006  . UMBILICAL HERNIA REPAIR  2007   along with gallbladder    Prior to Admission medications   Medication Sig Start Date End Date Taking? Authorizing Provider   ELIQUIS 5 MG TABS tablet Take 5 mg by mouth 2 (two) times daily. 12/26/17  Yes [provider]  morphine (MSIR) 15 MG tablet Take 1 tablet (15 mg total) by mouth every 4 (four) hours as needed for severe pain. Patient not taking: Reported on 06/10/2019 04/14/18   Deno Etienne, DO  ondansetron (ZOFRAN ODT) 4 MG disintegrating tablet 4mg  ODT q4 hours prn nausea/vomit Patient not taking: Reported on 06/10/2019 04/14/18   Deno Etienne, DO  tamsulosin (FLOMAX) 0.4 MG CAPS capsule Take 1 capsule (0.4 mg total) by mouth daily after supper. Patient not taking: Reported on 06/10/2019 04/14/18   Deno Etienne, DO    Scheduled Meds: . pantoprazole (PROTONIX) IV  40 mg Intravenous Q12H   Continuous Infusions: . sodium chloride 100 mL/hr at 06/11/19 0303   PRN Meds:.ondansetron **OR** ondansetron (ZOFRAN) IV  Allergies as of 06/10/2019  . (No Known Allergies)    History reviewed. No pertinent family history.  Social History   Socioeconomic History  . Marital status: Married    Spouse name: [name]  . Number of children: 2  . Years of education: 99  . Highest education level: Not on file  Occupational History  . Occupation: Retired  Tobacco Use  . Smoking status: Never Smoker  . Smokeless tobacco: Never Used  Substance and Sexual Activity  . Alcohol use: Yes    Alcohol/week: 1.0 standard drinks    Types: 1 Glasses of wine per week    Comment: socially  . Drug use: No  . Sexual activity: Not on file  Other Topics Concern  .  Not on file  Social History Narrative  . Not on file   Social Determinants of Health   Financial Resource Strain:   . Difficulty of Paying Living Expenses: Not on file  Food Insecurity:   . Worried About Charity fundraiser in the Last Year: Not on file  . Ran Out of Food in the Last Year: Not on file  Transportation Needs:   . Lack of Transportation (Medical): Not on file  . Lack of Transportation (Non-Medical): Not on file  Physical Activity:   . Days of  Exercise per Week: Not on file  . Minutes of Exercise per Session: Not on file  Stress:   . Feeling of Stress : Not on file  Social Connections:   . Frequency of Communication with Friends and Family: Not on file  . Frequency of Social Gatherings with Friends and Family: Not on file  . Attends Religious Services: Not on file  . Active Member of Clubs or Organizations: Not on file  . Attends Archivist Meetings: Not on file  . Marital Status: Not on file  Intimate Partner Violence:   . Fear of Current or Ex-Partner: Not on file  . Emotionally Abused: Not on file  . Physically Abused: Not on file  . Sexually Abused: Not on file    Review of Systems: All negative except as stated above in HPI.  Physical Exam: Vital signs: Vitals:   06/11/19 0257 06/11/19 0625  BP: (!) 152/72 (!) 152/76  Pulse: 81 79  Resp: 18 20  Temp: 98.6 F (37 C) 98.7 F (37.1 C)  SpO2: 95% 96%   Last BM Date: 06/10/19 General:   Lethargic, Well-developed, well-nourished, pleasant and cooperative in NAD Head: normocephalic, atraumatic Eyes: anicteric sclera ENT: oropharynx clear Neck: supple, nontender Lungs:  Clear throughout to auscultation.   No wheezes, crackles, or rhonchi. No acute distress. Heart:  Regular rate and rhythm; no murmurs, clicks, rubs,  or gallops. Abdomen: soft, nontender, nondistended, +BS  Rectal:  Deferred Ext: no edema  GI:  Lab Results: Recent Labs    06/10/19 1849 06/11/19 0528  WBC 11.6* 9.5  HGB 8.9* 8.7*  HCT 29.1* 29.2*  PLT 331 287   BMET Recent Labs    06/10/19 1849 06/11/19 0528  NA 140 140  K 4.0 3.9  CL 110 111  CO2 24 23  GLUCOSE 103* 102*  BUN 23 19  CREATININE 0.67 0.73  CALCIUM 9.2 8.6*   LFT Recent Labs    06/11/19 0528  PROT 6.4*  ALBUMIN 3.2*  AST 13*  ALT 10  ALKPHOS 45  BILITOT 0.9   PT/INR No results for input(s): LABPROT, INR in the last 72 hours.   Studies/Results: No results found.  Impression/Plan: 79  yo on Eliquis with history of iron deficiency anemia with melenic stools in need of an EGD to evaluate for peptic ulcer disease. May have Cameron's erosions from her hiatal hernia. Doubt any ongoing active bleeding but would do an updated EGD due to need to resume anticoagulation at discharge. NPO and do EGD today if schedule will permit.     LOS: 1 day   Lear Ng  06/11/2019, 9:31 AM  Questions please call [phone]

## 2019-06-11 NOTE — Anesthesia Postprocedure Evaluation (Signed)
Anesthesia Post Note  Patient: Lisa Owens  Procedure(s) Performed: ESOPHAGOGASTRODUODENOSCOPY (EGD) WITH PROPOFOL (N/A )     Patient location during evaluation: PACU Anesthesia Type: MAC Level of consciousness: awake and alert Pain management: pain level controlled Vital Signs Assessment: post-procedure vital signs reviewed and stable Respiratory status: spontaneous breathing, nonlabored ventilation and respiratory function stable Cardiovascular status: stable and blood pressure returned to baseline Anesthetic complications: no    Last Vitals:  Vitals:   06/11/19 1130 06/11/19 1337  BP: (!) 118/54 (!) 185/91  Pulse: 83 79  Resp: (!) 25 15  Temp:  36.6 C  SpO2: 95% 100%    Last Pain:  Vitals:   06/11/19 1337  TempSrc: Oral  PainSc:                  Audry Pili

## 2019-06-11 NOTE — Anesthesia Preprocedure Evaluation (Addendum)
Anesthesia Evaluation  Patient identified by MRN, date of birth, ID band Patient awake    Reviewed: Allergy & Precautions, NPO status , Patient's Chart, lab work & pertinent test results  History of Anesthesia Complications Negative for: history of anesthetic complications  Airway Mallampati: III  TM Distance: >3 FB Neck ROM: Full    Dental  (+) Dental Advisory Given, Teeth Intact   Pulmonary neg pulmonary ROS,    Pulmonary exam normal        Cardiovascular + DVT  Normal cardiovascular exam     Neuro/Psych negative neurological ROS  negative psych ROS   GI/Hepatic negative GI ROS, Neg liver ROS,   Endo/Other   Obesity   Renal/GU negative Renal ROS     Musculoskeletal  (+) Arthritis ,   Abdominal (+) + obese,   Peds  Hematology  (+) anemia ,   Anesthesia Other Findings Covid neg 1/28   Reproductive/Obstetrics                           Anesthesia Physical Anesthesia Plan  ASA: II  Anesthesia Plan: MAC   Post-op Pain Management:    Induction: Intravenous  PONV Risk Score and Plan: 2 and Propofol infusion and Treatment may vary due to age or medical condition  Airway Management Planned: Nasal Cannula and Natural Airway  Additional Equipment: None  Intra-op Plan:   Post-operative Plan:   Informed Consent: I have reviewed the patients History and Physical, chart, labs and discussed the procedure including the risks, benefits and alternatives for the proposed anesthesia with the patient or authorized representative who has indicated his/her understanding and acceptance.   Patient has DNR.  Discussed DNR with patient and Suspend DNR.     Plan Discussed with: CRNA and Anesthesiologist  Anesthesia Plan Comments:       Anesthesia Quick Evaluation

## 2019-06-11 NOTE — Progress Notes (Signed)
Pt begin drinking CT contrast and plan review with pt. Handoff given to [facility]. Pt acknowledged understanding. SRP, RN

## 2019-06-11 NOTE — Plan of Care (Signed)
  Problem: Education: Goal: Knowledge of General Education information will improve Description: Including pain rating scale, medication(s)/side effects and non-pharmacologic comfort measures Outcome: Progressing   Problem: Clinical Measurements: Goal: Ability to maintain clinical measurements within normal limits will improve Outcome: Progressing Goal: Diagnostic test results will improve Outcome: Progressing Goal: Respiratory complications will improve Outcome: Not Progressing Goal: Cardiovascular complication will be avoided Outcome: Not Progressing   Problem: Activity: Goal: Risk for activity intolerance will decrease Outcome: Progressing

## 2019-06-11 NOTE — Progress Notes (Signed)
Pt return from ENDO and orders reviewed. CT scan planned for the afternoon. SRP, RN

## 2019-06-11 NOTE — Op Note (Signed)
[facility] Patient Name: Lisa Owens Procedure Date: 06/11/2019 MRN: [id number] Attending MD: Clarene Essex , MD Date of Birth: Feb 26, 1941 CSN: [id number] Age: 79 Admit Type: Inpatient Procedure:                Upper GI endoscopy Indications:              Melena Providers:                Clarene Essex, MD, Debi Mays, RN, Grace Isaac, RN Referring MD:              Medicines:                Propofol total dose 0000000 mg IV Complications:            No immediate complications. Estimated Blood Loss:     Estimated blood loss: none. Procedure:                Pre-Anesthesia Assessment:                           - Prior to the procedure, a History and Physical                            was performed, and patient medications and                            allergies were reviewed. The patient's tolerance of                            previous anesthesia was also reviewed. The risks                            and benefits of the procedure and the sedation                            options and risks were discussed with the patient.                            All questions were answered, and informed consent                            was obtained. Prior Anticoagulants: The patient has                            taken Eliquis (apixaban), last dose was 1 day prior                            to procedure. ASA Grade Assessment: II - A patient                            with mild systemic disease. After reviewing the                            risks and benefits, the patient was deemed in  satisfactory condition to undergo the procedure.                           After obtaining informed consent, the endoscope was                            passed under direct vision. Throughout the                            procedure, the patient's blood pressure, pulse, and                            oxygen saturations were monitored continuously. The       [id number]) Olympus gastroscope was                            introduced through the mouth, and advanced to the                            third part of duodenum. The upper GI endoscopy was                            accomplished without difficulty. The patient                            tolerated the procedure well. Scope In: Scope Out: Findings:      The middle third of the esophagus and lower third of the esophagus were       mildly tortuous.      A medium-sized hiatal hernia was present.      A few localized Cameron erosions with no stigmata of recent bleeding       were found in the cardia compatible with medium hiatal hernia.      A medium non-bleeding diverticulum was found in the third portion of the       duodenum.      The exam was otherwise without abnormality. Impression:               - Tortuous esophagus.                           - Medium-sized hiatal hernia.                           - Erosive gastropathy i.e. Lysbeth Galas lesions with no                            stigmata of recent bleeding.                           - Non-bleeding duodenal diverticulum.                           - The examination was otherwise normal.                           - No specimens collected. Moderate Sedation:  Not Applicable - Patient had care per Anesthesia. Recommendation:           - Patient has a contact number available for                            emergencies. The signs and symptoms of potential                            delayed complications were discussed with the                            patient. Return to normal activities tomorrow.                            Written discharge instructions were provided to the                            patient.                           - Soft diet today. We will proceed with a CT just                            to be sure no other at risk lesions                           - Continue present medications. Reevaluate to                             decide if she truly needs blood thinners going                            forward or if she needs a hematologic work-up for                            clotting disorder                           - Return to GI clinic in 4 weeks. To recheck CBC                            guaiacs and make sure no further work-up and plans                            are needed like possible capsule endoscopy or                            repeat colonoscopy                           - Telephone GI clinic if symptomatic PRN.                           - Use Protonix (pantoprazole) 40 mg PO daily  indefinitely. Procedure Code(s):        --- Professional ---                           [phone], Esophagogastroduodenoscopy, flexible,                            transoral; diagnostic, including collection of                            specimen(s) by brushing or washing, when performed                            (separate procedure) Diagnosis Code(s):        --- Professional ---                           Q39.9, Congenital malformation of esophagus,                            unspecified                           K44.9, Diaphragmatic hernia without obstruction or                            gangrene                           K31.89, Other diseases of stomach and duodenum                           K92.1, Melena (includes Hematochezia)                           K57.10, Diverticulosis of small intestine without                            perforation or abscess without bleeding CPT copyright 2019 [employer]. All rights reserved. The codes documented in this report are preliminary and upon coder review may  be revised to meet current compliance requirements. Clarene Essex, MD 06/11/2019 11:21:15 AM This report has been signed electronically. Number of Addenda: 0

## 2019-06-11 NOTE — Transfer of Care (Signed)
Immediate Anesthesia Transfer of Care Note  Patient: Lisa Owens  Procedure(s) Performed: ESOPHAGOGASTRODUODENOSCOPY (EGD) WITH PROPOFOL (N/A )  Patient Location: Endoscopy Unit  Anesthesia Type:MAC  Level of Consciousness: awake, oriented and patient cooperative  Airway & Oxygen Therapy: Patient Spontanous Breathing and Patient connected to face mask oxygen  Post-op Assessment: Report given to RN and Post -op Vital signs reviewed and stable  Post vital signs: Reviewed and stable  Last Vitals:  Vitals Value Taken Time  BP 118/54 06/11/19 1130  Temp 36.4 C 06/11/19 1120  Pulse 81 06/11/19 1131  Resp 27 06/11/19 1131  SpO2 97 % 06/11/19 1131  Vitals shown include unvalidated device data.  Last Pain:  Vitals:   06/11/19 1120  TempSrc: Oral  PainSc:          Complications: No apparent anesthesia complications

## 2019-06-11 NOTE — Progress Notes (Signed)
Lisa Owens 10:48 AM  Subjective: Patient seen and examined in her hospital computer chart reviewed and her case was discussed with my partner Dr. Michail Sermon and she is familiar to me from a previous admission in 2019 and she saw some dark stool twice this week and has felt weak and rundown this week but has no other complaints  Objective: Vital signs stable afebrile no acute distress exam please see preassessment evaluation labs reviewed BUN and creatinine okay hemoglobin slight decrease  Assessment: GI blood loss and patient on blood thinner for chronic DVT  Plan: Okay to proceed with endoscopy today with anesthesia assistance  [facility]  office [phone] After 5PM or if no answer call [phone]

## 2019-06-12 ENCOUNTER — Encounter (HOSPITAL_COMMUNITY): Payer: Medicare HMO

## 2019-06-12 DIAGNOSIS — K254 Chronic or unspecified gastric ulcer with hemorrhage: Secondary | ICD-10-CM

## 2019-06-12 DIAGNOSIS — K922 Gastrointestinal hemorrhage, unspecified: Secondary | ICD-10-CM

## 2019-06-12 LAB — HEMOGLOBIN AND HEMATOCRIT, BLOOD
HCT: 29.1 % — ABNORMAL LOW (ref 36.0–46.0)
Hemoglobin: 8.6 g/dL — ABNORMAL LOW (ref 12.0–15.0)

## 2019-06-12 MED ORDER — APIXABAN 2.5 MG PO TABS
2.5000 mg | ORAL_TABLET | Freq: Two times a day (BID) | ORAL | Status: DC
  Administered 2019-06-12: 2.5 mg via ORAL
  Filled 2019-06-12: qty 1

## 2019-06-12 MED ORDER — PANTOPRAZOLE SODIUM 40 MG PO TBEC: 40.0000 mg | DELAYED_RELEASE_TABLET | Freq: Every day | ORAL | 4 refills | Status: DC

## 2019-06-12 MED ORDER — APIXABAN 2.5 MG PO TABS: 2.5000 mg | ORAL_TABLET | Freq: Two times a day (BID) | ORAL | 4 refills | Status: AC

## 2019-06-12 MED ORDER — AMLODIPINE BESYLATE 2.5 MG PO TABS: 2.5000 mg | ORAL_TABLET | Freq: Every day | ORAL | 3 refills | Status: DC

## 2019-06-12 NOTE — Discharge Summary (Signed)
Physician Discharge Summary   Patient ID: Lisa Owens MRN: [id number] DOB/AGE: 1941-02-08 79 y.o.  Admit date: 06/10/2019 Discharge date: 06/12/2019  Primary Care Physician:  Lawerance Cruel, MD   Recommendations for Outpatient Follow-up:  1. Patient recommended to follow-up with PCP on Monday to repeat CBC 2. History of recurrent DVTs, sedentary lifestyle, patient does not feel comfortable stopping Eliquis.  Placed on Eliquis 2.5 mg twice daily. 3. Follow with GI in 4 weeks, started on Protonix daily 4. Hematology referral signed, discussed with Dr. Burr Medico, patient will have outpatient consultation in hematology clinic for recurrent DVTs and anticoagulation.  Home Health: None Equipment/Devices: None  Discharge Condition: stable  CODE STATUS: FULL  Diet recommendation: Heart healthy diet   Discharge Diagnoses:    . Upper GI bleed . History of recurrent DVTs, on lifelong anticoagulation Hypertension   Consults: Gastroenterology    Allergies:  No Known Allergies   DISCHARGE MEDICATIONS: Allergies as of 06/12/2019   No Known Allergies     Medication List    STOP taking these medications   ondansetron 4 MG disintegrating tablet Commonly known as: Zofran ODT     TAKE these medications   amLODipine 2.5 MG tablet Commonly known as: NORVASC Take 1 tablet (2.5 mg total) by mouth daily.   apixaban 2.5 MG Tabs tablet Commonly known as: ELIQUIS Take 1 tablet (2.5 mg total) by mouth 2 (two) times daily. What changed:   medication strength  how much to take   pantoprazole 40 MG tablet Commonly known as: PROTONIX Take 1 tablet (40 mg total) by mouth daily.        Brief H and P: For complete details please refer to admission H and P, but in brief patient is a 79 year old female with history of recurrent, multiple DVTs in the past in 2017 and 2019, on Eliquis lifelong anticoagulation, osteoarthritis, previous GI bleed in September 2019 presented with  rectal bleed.  Patient was seen by PCP secondary to dark rectal bleed.  Patient's hemoglobin had dropped to 8 denied any hematemesis. Patient was admitted and GI was consulted.  FOBT positive.  Hospital Course:   Rectal bleeding with underlying history of iron deficiency anemia and on anticoagulation -Patient presented with melanotic stools -Hemoglobin 8.9 at the time of admission. -GI was consulted, patient underwent EGD showed medium size hiatal hernia, few localized [place] erosions with no stigmata of the recent bleeding, medium nonbleeding diverticulum. -Patient tolerated soft diet.  CT abdomen was done which showed no mass or acute abdominal pathology. -GI recommended PPI daily. -Patient was strongly recommended to avoid NSAIDs -Eliquis resumed at 2.5 mg twice daily, patient recommended to follow-up with PCP on Monday in 2 days to repeat CBC.  History of recurrent DVTs, on anticoagulation -Eliquis was held during hospitalization. -Discussed in detail with the patient, she states she has had 5 DVTs in the past, has been told that she needs to be on lifelong anticoagulation.  She does not want to hold or discontinue Eliquis at this time.  Patient reported that her PCP had mentioned decreasing the dose of Eliquis to 2.5 mg twice daily in the light of GI bleeds, previous GI bleed in 2019 -Decreased Eliquis to 2.5 mg twice daily -Also discussed with hematology, Dr. Burr Medico, patient needs outpatient hematology evaluation regarding recurrent DVTs and anticoagulation.  Dr. Burr Medico will arrange outpatient follow-up for consultation.  Hypertension Patient was noted to have elevated BP readings during hospitalization, started on amlodipine 2.5 mg daily  Day of Discharge  S: No acute complaints, wants to go home.  Denies any further rectal bleeding, no dizziness or lightheadedness.  BP (!) 166/78 (BP Location: Right Arm)   Pulse 78   Temp 98.3 F (36.8 C) (Oral)   Resp 20   Ht 5\' 3"  (1.6 m)   Wt  99 kg   SpO2 96%   BMI 38.65 kg/m   Physical Exam: General: Alert and awake oriented x3 not in any acute distress. HEENT: anicteric sclera, pupils reactive to light and accommodation CVS: S1-S2 clear no murmur rubs or gallops Chest: clear to auscultation bilaterally, no wheezing rales or rhonchi Abdomen: soft nontender, nondistended, normal bowel sounds Extremities: no cyanosis, clubbing or edema noted bilaterally Neuro: Cranial nerves II-XII intact, no focal neurological deficits    Get Medicines reviewed and adjusted: Please take all your medications with you for your next visit with your Primary MD  Please request your Primary MD to go over all hospital tests and procedure/radiological results at the follow up. Please ask your Primary MD to get all Hospital records sent to his/her office.  If you experience worsening of your admission symptoms, develop shortness of breath, life threatening emergency, suicidal or homicidal thoughts you must seek medical attention immediately by calling 911 or calling your MD immediately  if symptoms less severe.  You must read complete instructions/literature along with all the possible adverse reactions/side effects for all the Medicines you take and that have been prescribed to you. Take any new Medicines after you have completely understood and accept all the possible adverse reactions/side effects.   Do not drive when taking pain medications.   Do not take more than prescribed Pain, Sleep and Anxiety Medications  Special Instructions: If you have smoked or chewed Tobacco  in the last 2 yrs please stop smoking, stop any regular Alcohol  and or any Recreational drug use.  Wear Seat belts while driving.  Please note  You were cared for by a hospitalist during your hospital stay. Once you are discharged, your primary care physician will handle any further medical issues. Please note that NO REFILLS for any discharge medications will be authorized  once you are discharged, as it is imperative that you return to your primary care physician (or establish a relationship with a primary care physician if you do not have one) for your aftercare needs so that they can reassess your need for medications and monitor your lab values.   The results of significant diagnostics from this hospitalization (including imaging, microbiology, ancillary and laboratory) are listed below for reference.      Procedures/Studies:  CT ABDOMEN PELVIS W CONTRAST  Result Date: 06/11/2019 CLINICAL DATA:  Black stools, evaluate for mass EXAM: CT ABDOMEN AND PELVIS WITH CONTRAST TECHNIQUE: Multidetector CT imaging of the abdomen and pelvis was performed using the standard protocol following bolus administration of intravenous contrast. CONTRAST:  142mL OMNIPAQUE IOHEXOL 300 MG/ML SOLN, additional oral enteric contrast COMPARISON:  04/14/2018 FINDINGS: Lower chest: No acute abnormality. Redemonstrated large paraesophageal hernia containing the stomach. Associated atelectasis of the left lung base. Hepatobiliary: No focal liver abnormality is seen. Status post cholecystectomy. No biliary dilatation. Pancreas: Unremarkable. No pancreatic ductal dilatation or surrounding inflammatory changes. Spleen: Normal in size without significant abnormality. Adrenals/Urinary Tract: Adrenal glands are unremarkable. Nonobstructive left nephrolithiasis. No hydronephrosis. Bladder is unremarkable. Stomach/Bowel: Stomach is within normal limits. Appendix appears normal. No evidence of bowel wall thickening, distention, or inflammatory changes. Sigmoid diverticula. Vascular/Lymphatic: Aortic atherosclerosis. No enlarged abdominal or pelvic lymph  nodes. Reproductive: No mass or other significant abnormality. Other: No abdominal wall hernia or abnormality. No abdominopelvic ascites. Musculoskeletal: No acute or significant osseous findings. Levoscoliosis of the lumbar spine. IMPRESSION: 1. No specific CT  findings in the abdomen or pelvis to explain GI bleed. No evidence of mass. 2. Redemonstrated large paraesophageal hernia with intrathoracic position of the stomach. 3. Sigmoid diverticulosis. 4. Nonobstructive left nephrolithiasis. 5.  Aortic Atherosclerosis (ICD10-I70.0). Electronically Signed   By: Eddie Candle M.D.   On: 06/11/2019 16:50       LAB RESULTS: Basic Metabolic Panel: Recent Labs  Lab 06/10/19 1849 06/11/19 0528  NA 140 140  K 4.0 3.9  CL 110 111  CO2 24 23  GLUCOSE 103* 102*  BUN 23 19  CREATININE 0.67 0.73  CALCIUM 9.2 8.6*   Liver Function Tests: Recent Labs  Lab 06/10/19 1849 06/11/19 0528  AST 16 13*  ALT 12 10  ALKPHOS 49 45  BILITOT 0.5 0.9  PROT 7.5 6.4*  ALBUMIN 3.6 3.2*   No results for input(s): LIPASE, AMYLASE in the last 168 hours. No results for input(s): AMMONIA in the last 168 hours. CBC: Recent Labs  Lab 06/10/19 1849 06/10/19 1849 06/11/19 0528 06/11/19 0528 06/11/19 1654 06/12/19 0549  WBC 11.6*  --  9.5  --   --   --   HGB 8.9*   < > 8.7*   < > 9.1* 8.6*  HCT 29.1*   < > 29.2*   < > 29.9* 29.1*  MCV 78.4*   < > 78.9*  --   --   --   PLT 331  --  287  --   --   --    < > = values in this interval not displayed.   Cardiac Enzymes: No results for input(s): CKTOTAL, CKMB, CKMBINDEX, TROPONINI in the last 168 hours. BNP: Invalid input(s): POCBNP CBG: No results for input(s): GLUCAP in the last 168 hours.     Disposition and Follow-up: Discharge Instructions    Ambulatory referral to Hematology   Complete by: As directed    Recurrent DVT's, on eliquis. Has also history GI bleed.   Diet - low sodium heart healthy   Complete by: As directed    Discharge instructions   Complete by: As directed    Please avoid NSAIDS, take tylenol for back pain.   Hematology clinic will call you for evaluation.   Increase activity slowly   Complete by: As directed        DISPOSITION: Home   DISCHARGE FOLLOW-UP Follow-up  Information    Lawerance Cruel, MD. Schedule an appointment as soon as possible for a visit.   Specialty: Family Medicine Why: please get CBC checked on Monday  Contact information: [place] [address] [phone]        Clarene Essex, MD. Schedule an appointment as soon as possible for a visit in 4 week(s).   Specialty: Gastroenterology Why: for GI Contact information: [address] [place] [address] [phone]            Time coordinating discharge:  35 minutes  Signed:   Estill Cotta M.D. [facility] 06/12/2019, 10:07 AM

## 2019-06-14 ENCOUNTER — Encounter: Payer: Self-pay | Admitting: *Deleted

## 2019-06-14 DIAGNOSIS — Z86718 Personal history of other venous thrombosis and embolism: Secondary | ICD-10-CM | POA: Diagnosis not present

## 2019-06-14 DIAGNOSIS — Z7901 Long term (current) use of anticoagulants: Secondary | ICD-10-CM | POA: Diagnosis not present

## 2019-06-14 DIAGNOSIS — D649 Anemia, unspecified: Secondary | ICD-10-CM | POA: Diagnosis not present

## 2019-06-14 DIAGNOSIS — K922 Gastrointestinal hemorrhage, unspecified: Secondary | ICD-10-CM | POA: Diagnosis not present

## 2019-06-15 ENCOUNTER — Telehealth: Payer: Self-pay | Admitting: Hematology

## 2019-06-15 NOTE — Telephone Encounter (Signed)
A new hem appt has been scheduled for Lisa Owens to see Dr. Irene Limbo on 2/8 at 11am. Pt was seen in the hospital and has been made aware to arrive 15 minutes early.

## 2019-06-21 ENCOUNTER — Other Ambulatory Visit: Payer: Self-pay

## 2019-06-21 ENCOUNTER — Inpatient Hospital Stay: Payer: Medicare HMO

## 2019-06-21 ENCOUNTER — Inpatient Hospital Stay: Payer: Medicare HMO | Attending: Hematology | Admitting: Hematology

## 2019-06-21 VITALS — BP 169/87 | HR 93 | Temp 98.0°F | Resp 18 | Ht 63.0 in | Wt 216.0 lb

## 2019-06-21 DIAGNOSIS — D6859 Other primary thrombophilia: Secondary | ICD-10-CM

## 2019-06-21 DIAGNOSIS — N200 Calculus of kidney: Secondary | ICD-10-CM | POA: Insufficient documentation

## 2019-06-21 DIAGNOSIS — R0602 Shortness of breath: Secondary | ICD-10-CM | POA: Insufficient documentation

## 2019-06-21 DIAGNOSIS — D500 Iron deficiency anemia secondary to blood loss (chronic): Secondary | ICD-10-CM | POA: Diagnosis not present

## 2019-06-21 DIAGNOSIS — R5383 Other fatigue: Secondary | ICD-10-CM | POA: Diagnosis not present

## 2019-06-21 DIAGNOSIS — Z86718 Personal history of other venous thrombosis and embolism: Secondary | ICD-10-CM | POA: Diagnosis not present

## 2019-06-21 DIAGNOSIS — Z7901 Long term (current) use of anticoagulants: Secondary | ICD-10-CM | POA: Insufficient documentation

## 2019-06-21 DIAGNOSIS — M549 Dorsalgia, unspecified: Secondary | ICD-10-CM | POA: Diagnosis not present

## 2019-06-21 DIAGNOSIS — J9811 Atelectasis: Secondary | ICD-10-CM | POA: Diagnosis not present

## 2019-06-21 DIAGNOSIS — K5730 Diverticulosis of large intestine without perforation or abscess without bleeding: Secondary | ICD-10-CM | POA: Diagnosis not present

## 2019-06-21 DIAGNOSIS — I700 Atherosclerosis of aorta: Secondary | ICD-10-CM | POA: Diagnosis not present

## 2019-06-21 DIAGNOSIS — K449 Diaphragmatic hernia without obstruction or gangrene: Secondary | ICD-10-CM | POA: Diagnosis not present

## 2019-06-21 LAB — CBC WITH DIFFERENTIAL/PLATELET
Abs Immature Granulocytes: 0.04 10*3/uL (ref 0.00–0.07)
Basophils Absolute: 0.1 10*3/uL (ref 0.0–0.1)
Basophils Relative: 1 %
Eosinophils Absolute: 0.1 10*3/uL (ref 0.0–0.5)
Eosinophils Relative: 1 %
HCT: 33 % — ABNORMAL LOW (ref 36.0–46.0)
Hemoglobin: 10.1 g/dL — ABNORMAL LOW (ref 12.0–15.0)
Immature Granulocytes: 0 %
Lymphocytes Relative: 19 %
Lymphs Abs: 2 10*3/uL (ref 0.7–4.0)
MCH: 23.8 pg — ABNORMAL LOW (ref 26.0–34.0)
MCHC: 30.6 g/dL (ref 30.0–36.0)
MCV: 77.8 fL — ABNORMAL LOW (ref 80.0–100.0)
Monocytes Absolute: 0.7 10*3/uL (ref 0.1–1.0)
Monocytes Relative: 6 %
Neutro Abs: 7.8 10*3/uL — ABNORMAL HIGH (ref 1.7–7.7)
Neutrophils Relative %: 73 %
Platelets: 498 10*3/uL — ABNORMAL HIGH (ref 150–400)
RBC: 4.24 MIL/uL (ref 3.87–5.11)
RDW: 17.2 % — ABNORMAL HIGH (ref 11.5–15.5)
WBC: 10.6 10*3/uL — ABNORMAL HIGH (ref 4.0–10.5)
nRBC: 0 % (ref 0.0–0.2)

## 2019-06-21 LAB — CMP (CANCER CENTER ONLY)
ALT: 9 U/L (ref 0–44)
AST: 15 U/L (ref 15–41)
Albumin: 3.7 g/dL (ref 3.5–5.0)
Alkaline Phosphatase: 65 U/L (ref 38–126)
Anion gap: 10 (ref 5–15)
BUN: 17 mg/dL (ref 8–23)
CO2: 25 mmol/L (ref 22–32)
Calcium: 8.8 mg/dL — ABNORMAL LOW (ref 8.9–10.3)
Chloride: 108 mmol/L (ref 98–111)
Creatinine: 0.77 mg/dL (ref 0.44–1.00)
GFR, Est AFR Am: >60 mL/min (ref >60)
GFR, Estimated: >60 mL/min (ref >60)
Glucose, Bld: 100 mg/dL — ABNORMAL HIGH (ref 70–99)
Potassium: 4 mmol/L (ref 3.5–5.1)
Sodium: 143 mmol/L (ref 135–145)
Total Bilirubin: 0.3 mg/dL (ref 0.3–1.2)
Total Protein: 7.9 g/dL (ref 6.5–8.1)

## 2019-06-21 LAB — IRON AND TIBC
Iron: 9 ug/dL — ABNORMAL LOW (ref 41–142)
Saturation Ratios: 2 % — ABNORMAL LOW (ref 21–57)
TIBC: 454 ug/dL — ABNORMAL HIGH (ref 236–444)
UIBC: 445 ug/dL — ABNORMAL HIGH (ref 120–384)

## 2019-06-21 LAB — FERRITIN: Ferritin: <4 ng/mL — ABNORMAL LOW (ref 11–307)

## 2019-06-21 LAB — ANTITHROMBIN III: AntiThromb III Func: 91 % (ref 75–120)

## 2019-06-21 NOTE — Progress Notes (Signed)
HEMATOLOGY/ONCOLOGY CONSULTATION NOTE  Date of Service: 06/21/2019  Patient Care Team: Lawerance Cruel, MD as PCP - General (Family Medicine)  CHIEF COMPLAINTS/PURPOSE OF CONSULTATION:  Hx of DVT  HISTORY OF PRESENTING ILLNESS:   Lisa Owens is a wonderful 79 y.o. female who has been referred to [name] by Dr Tana Coast for evaluation and management of recurrent DVT. The pt reports that she is doing well overall.   The pt reports that she had her first DVT in January 2018 in her lower left leg. She believes that her immobility was a factor. No other provoking incident was discussed by her healthcare providers at the time. After her first DVT she was placed on anticoagulation for 3 months and was not placed on a baby Asprin afterwards. She had a second DVT in her right calf in July 2019. She and her husband did a bit of long-distance traveling prior to this incident but she did not have any surgeries or medication changes at the time. Pt was placed on 5 mg of Eliquis and continued on this dose until a recent visit to the ER on 06/10/2019 when she was admitted for melena. During this visit she was given a blood transfusion which helped with her SOB and fatigue some, but she is still experiencing both symptoms. Pt was placed on a preventive dose of Eliquis upon discharge. She denies taking any OTC medication prior to this event. This was the pt's second episode of dark stools as she had previously experienced them in September of 2019. Pt was given an IV iron infusion to help with her blood loss in September 2019 but was not given any IV iron during her last visit to the ED. She has not noticed any black/bloody stools since hospital discharge. She was started on Protonix and Amlodipine during her last hospital visit.   Pt has noticed that her left leg has continued to be larger than her right leg since her first DVT. Pt has fallen on her left leg and has had stem cell injections in her left knee.   Pt  has chronic back pain that began about 14 years ago. She has DDD and a twisted pelvis. This back pain is preventing the pt from moving about and causing her to be incredibly sedentary. Pt is hesitant to take pain medication for her back pain to help her be more active. She is concerned that she may become addicted to pain medication and that it may have negative side effects on her organs.   Pt's father passed from a blood clot in his 14's and her brother had a PE in his 45's. Her brother has Afib but has no history of smoking.  She currently sees Dr. Watt Climes for GI. He believes that her bloody stools are due to bleeding ulcers. She has an upcoming appointment with him on 07/07/2019. Pt has an appointment with PCP tomorrow.   Of note prior to the patient's visit today, pt has had US Venous Img Lower Unilateral Left ([id number]) completed on 05/27/2016 with results revealing "Superficial thrombophlebitis involving the great saphenous vein with component of extension into the common femoral vein. Common femoral component is consistent with nonocclusive DVT."  Pt has had US Venous Lower Right ([id number]) completed on 11/20/2017 with results revealing "Calf vein thrombosis involving the posterior tibial and peroneal veins as above from knee to ankle and within the great saphenous vein from mid calf to ankle."   Pt has had CT Abd/Pel ([id number]) completed  on 06/11/2019 with results revealing "2. Redemonstrated large paraesophageal hernia with intrathoracic position of the stomach."  Pt has had Upper Endoscopy completed on 06/11/2019 with results revealing "- Tortuous esophagus. - Medium-sized hiatal hernia. - Erosive gastropathy i.e. Lysbeth Galas lesions with no stigmata of recent bleeding. - Non-bleeding duodenal diverticulum. - The examination was otherwise normal. - No specimens collected."  Most recent lab results (06/11/2019) of CBC and CMP is as follows: all values are WNL except for RBC at 3.70, Hgb at  8.7, HCT at 29.2, MCV at 78.9, MCH at 23.5, MCHC at 29.8, RDW at 16.8, Glucose at 102, Calcium at 8.6, Total Protein at 6.4, Albumin at 3.2, AST at 13.  On review of systems, pt reports SOB, fatigue and denies unexpected weight loss, calf pain, chest pain and any other symptoms.   On PMHx the pt reports DDD, Recurrent DVT, Diverticulitis, Arthritis. On Family Hx the pt reports her father died from a blood clot in his 66's, her brother had a PE in his 73's  MEDICAL HISTORY:  Past Medical History:  Diagnosis Date  . Arthritis   . Diverticulitis 01/2018  . DVT (deep venous thrombosis) ([place])     SURGICAL HISTORY: Past Surgical History:  Procedure Laterality Date  . BUNIONECTOMY  2001   along with hammer toe  . CHOLECYSTECTOMY  2007  . COLONOSCOPY WITH PROPOFOL N/A 01/23/2018   Procedure: COLONOSCOPY WITH PROPOFOL;  Surgeon: Clarene Essex, MD;  Location: [facility];  Service: Endoscopy;  Laterality: N/A;  . ESOPHAGOGASTRODUODENOSCOPY (EGD) WITH PROPOFOL N/A 01/22/2018   Procedure: ESOPHAGOGASTRODUODENOSCOPY (EGD) WITH PROPOFOL;  Surgeon: Clarene Essex, MD;  Location: [facility];  Service: Endoscopy;  Laterality: N/A;  . ESOPHAGOGASTRODUODENOSCOPY (EGD) WITH PROPOFOL N/A 06/11/2019   Procedure: ESOPHAGOGASTRODUODENOSCOPY (EGD) WITH PROPOFOL;  Surgeon: Clarene Essex, MD;  Location: [facility];  Service: Endoscopy;  Laterality: N/A;  . EYE SURGERY  2011   bilateral cataract  . [place]  2001  . SHOULDER OPEN ROTATOR CUFF REPAIR  09/19/2011   Procedure: ROTATOR CUFF REPAIR SHOULDER OPEN;  Surgeon: Johnn Hai, MD;  Location: [facility];  Service: Orthopedics;  Laterality: Right;  . SUBACROMIAL DECOMPRESSION  09/19/2011   Procedure: SUBACROMIAL DECOMPRESSION;  Surgeon: Johnn Hai, MD;  Location: [facility];  Service: Orthopedics;  Laterality: Right;  . tummy tuck  2006  . UMBILICAL HERNIA REPAIR  2007   along with gallbladder    SOCIAL HISTORY: Social History   Socioeconomic  History  . Marital status: Married    Spouse name: [name]  . Number of children: 2  . Years of education: 17  . Highest education level: Not on file  Occupational History  . Occupation: Retired  Tobacco Use  . Smoking status: Never Smoker  . Smokeless tobacco: Never Used  Substance and Sexual Activity  . Alcohol use: Yes    Alcohol/week: 1.0 standard drinks    Types: 1 Glasses of wine per week    Comment: socially  . Drug use: No  . Sexual activity: Not on file  Other Topics Concern  . Not on file  Social History Narrative  . Not on file   Social Determinants of Health   Financial Resource Strain:   . Difficulty of Paying Living Expenses: Not on file  Food Insecurity:   . Worried About Charity fundraiser in the Last Year: Not on file  . Ran Out of Food in the Last Year: Not on file  Transportation Needs:   .  Lack of Transportation (Medical): Not on file  . Lack of Transportation (Non-Medical): Not on file  Physical Activity:   . Days of Exercise per Week: Not on file  . Minutes of Exercise per Session: Not on file  Stress:   . Feeling of Stress : Not on file  Social Connections:   . Frequency of Communication with Friends and Family: Not on file  . Frequency of Social Gatherings with Friends and Family: Not on file  . Attends Religious Services: Not on file  . Active Member of Clubs or Organizations: Not on file  . Attends Archivist Meetings: Not on file  . Marital Status: Not on file  Intimate Partner Violence:   . Fear of Current or Ex-Partner: Not on file  . Emotionally Abused: Not on file  . Physically Abused: Not on file  . Sexually Abused: Not on file    FAMILY HISTORY: No family history on file.  ALLERGIES:  has No Known Allergies.  MEDICATIONS:  Current Outpatient Medications  Medication Sig Dispense Refill  . amLODipine (NORVASC) 2.5 MG tablet Take 1 tablet (2.5 mg total) by mouth daily. 30 tablet 3  . apixaban (ELIQUIS) 2.5 MG TABS  tablet Take 1 tablet (2.5 mg total) by mouth 2 (two) times daily. 60 tablet 4  . pantoprazole (PROTONIX) 40 MG tablet Take 1 tablet (40 mg total) by mouth daily. 30 tablet 4   No current facility-administered medications for this visit.    REVIEW OF SYSTEMS:    10 Point review of Systems was done is negative except as noted above.  PHYSICAL EXAMINATION: ECOG PERFORMANCE STATUS: 3 - Symptomatic, >50% confined to bed  . Vitals:   06/21/19 1107  BP: (!) 169/87  Pulse: 93  Resp: 18  Temp: 98 F (36.7 C)  SpO2: 98%   Filed Weights   06/21/19 1107  Weight: 216 lb (98 kg)   .Body mass index is 38.26 kg/m.  Exam was given in a chair   GENERAL:alert, in no acute distress and comfortable SKIN: no acute rashes, no significant lesions EYES: conjunctiva are pink and non-injected, sclera anicteric OROPHARYNX: MMM, no exudates, no oropharyngeal erythema or ulceration NECK: supple, no JVD LYMPH:  no palpable lymphadenopathy in the cervical, axillary or inguinal regions LUNGS: clear to auscultation b/l with normal respiratory effort HEART: regular rate & rhythm ABDOMEN:  normoactive bowel sounds , non tender, not distended. Extremity: no pedal edema PSYCH: alert & oriented x 3 with fluent speech NEURO: no focal motor/sensory deficits  LABORATORY DATA:  I have reviewed the data as listed  . CBC Latest Ref Rng & Units 06/21/2019 06/12/2019 06/11/2019  WBC 4.0 - 10.5 K/uL 10.6(H) - -  Hemoglobin 12.0 - 15.0 g/dL 10.1(L) 8.6(L) 9.1(L)  Hematocrit 36.0 - 46.0 % 33.0(L) 29.1(L) 29.9(L)  Platelets 150 - 400 K/uL 498(H) - -    . CMP Latest Ref Rng & Units 06/21/2019 06/11/2019 06/10/2019  Glucose 70 - 99 mg/dL 100(H) 102(H) 103(H)  BUN 8 - 23 mg/dL 17 19 23   Creatinine 0.44 - 1.00 mg/dL 0.77 0.73 0.67  Sodium 135 - 145 mmol/L 143 140 140  Potassium 3.5 - 5.1 mmol/L 4.0 3.9 4.0  Chloride 98 - 111 mmol/L 108 111 110  CO2 22 - 32 mmol/L 25 23 24   Calcium 8.9 - 10.3 mg/dL 8.8(L) 8.6(L)  9.2  Total Protein 6.5 - 8.1 g/dL 7.9 6.4(L) 7.5  Total Bilirubin 0.3 - 1.2 mg/dL 0.3 0.9 0.5  Alkaline Phos  38 - 126 U/L 65 45 49  AST 15 - 41 U/L 15 13(L) 16  ALT 0 - 44 U/L 9 10 12      RADIOGRAPHIC STUDIES: I have personally reviewed the radiological images as listed and agreed with the findings in the report. CT ABDOMEN PELVIS W CONTRAST  Result Date: 06/11/2019 CLINICAL DATA:  Black stools, evaluate for mass EXAM: CT ABDOMEN AND PELVIS WITH CONTRAST TECHNIQUE: Multidetector CT imaging of the abdomen and pelvis was performed using the standard protocol following bolus administration of intravenous contrast. CONTRAST:  139mL OMNIPAQUE IOHEXOL 300 MG/ML SOLN, additional oral enteric contrast COMPARISON:  04/14/2018 FINDINGS: Lower chest: No acute abnormality. Redemonstrated large paraesophageal hernia containing the stomach. Associated atelectasis of the left lung base. Hepatobiliary: No focal liver abnormality is seen. Status post cholecystectomy. No biliary dilatation. Pancreas: Unremarkable. No pancreatic ductal dilatation or surrounding inflammatory changes. Spleen: Normal in size without significant abnormality. Adrenals/Urinary Tract: Adrenal glands are unremarkable. Nonobstructive left nephrolithiasis. No hydronephrosis. Bladder is unremarkable. Stomach/Bowel: Stomach is within normal limits. Appendix appears normal. No evidence of bowel wall thickening, distention, or inflammatory changes. Sigmoid diverticula. Vascular/Lymphatic: Aortic atherosclerosis. No enlarged abdominal or pelvic lymph nodes. Reproductive: No mass or other significant abnormality. Other: No abdominal wall hernia or abnormality. No abdominopelvic ascites. Musculoskeletal: No acute or significant osseous findings. Levoscoliosis of the lumbar spine. IMPRESSION: 1. No specific CT findings in the abdomen or pelvis to explain GI bleed. No evidence of mass. 2. Redemonstrated large paraesophageal hernia with intrathoracic  position of the stomach. 3. Sigmoid diverticulosis. 4. Nonobstructive left nephrolithiasis. 5.  Aortic Atherosclerosis (ICD10-I70.0). Electronically Signed   By: Eddie Candle M.D.   On: 06/11/2019 16:50    ASSESSMENT & PLAN:   79 yo with   1) Recurrent unprovoked VTE. Risk factors - age, obesity (.Body mass index is 38.26 kg/m.), relative immobility,peripheral venous insufficiency No clear acute provoking   2) GI bleeding due to cameron ulcers related to hiatal hernia  PLAN: -Discussed patient's most recent labs from 06/11/2019, all values are WNL except for RBC at 3.70, Hgb at 8.7, HCT at 29.2, MCV at 78.9, MCH at 23.5, MCHC at 29.8, RDW at 16.8, Glucose at 102, Calcium at 8.6, Total Protein at 6.4, Albumin at 3.2, AST at 13. -Discussed 05/27/2016 US Venous Img Lower Unilateral Left ([id number]) which revealed "Superficial thrombophlebitis involving the great saphenous vein with component of extension into the common femoral vein. Common femoral component is consistent with nonocclusive DVT." -Discussed  11/20/2017 US Venous Lower Right ([id number]) which revealed "Calf vein thrombosis involving the posterior tibial and peroneal veins as above from knee to ankle and within the great saphenous vein from mid calf to ankle."  -Discussed 06/11/2019 CT Abd/Pel ([id number]) which revealed "2. Redemonstrated large paraesophageal hernia with intrathoracic position of the stomach." -Discussed 06/11/2019 Upper Endoscopy revealed "- Tortuous esophagus. - Medium-sized hiatal hernia. - Erosive gastropathy i.e. Lysbeth Galas lesions with no stigmata of recent bleeding. - Non-bleeding duodenal diverticulum. - The examination was otherwise normal. - No specimens collected." -Advised pt that the risk of repeat blood clot due to an unprovoked event is 8-15% per year vs 2.5-5% per year for a provoked event -Pt appears to have no clear, provoking event for either DVT -Advised that a sedentary lifestyle and weight gain  are risk factors for repeat blood clot - would like to address this to prevent a repeat event -Advised pt that we would not recommend full-dose blood thinners ongoing due to bleeding concerns -Recommend pt  speak with a pain management clinic about medication options and education for her chronic back pain that keeps her from staying active. -Recommended pt stay well hydrated (drink 48-64 oz of water per day), wear compression socks, move about every hour when sitting, stay as active as possible and avoid long-distance travel when possible -Advised pt continue 2.5 mg Eliquis BID unless over GI bleeding -Advised pt avoid OTC pain medication -Advised pt avoid PO iron due to needing to watch for bloody/black stools - will give IV iron if necessary -Advised pt to immediately discontinue Eliquis and go to ED in the setting of another bleeding event -Recommend f/u with Dr. Watt Climes as scheduled -Recommend f/u with PCP for medication management -Will get labs today, including hypercoagulable work up -Will see back in 2 months with labs   FOLLOW UP: Labs today RTC with Dr Irene Limbo in 2 months with labs  All of the patients questions were answered with apparent satisfaction. The patient knows to call the clinic with any problems, questions or concerns.  I spent 45 mins counseling the patient face to face. The total time spent in the appointment was 60 minutes and more than 50% was on counseling and direct patient cares.    Sullivan Lone MD [place] AAHIVMS [facility] Hematology/Oncology Physician [facility]  (Office):       [phone] (Work cell):  [phone] (Fax):           [phone]  06/21/2019 4:41 PM  I, Yevette Edwards, am acting as a scribe for Dr. Sullivan Lone.   .I have reviewed the above documentation for accuracy and completeness, and I agree with the above. Brunetta Genera MD

## 2019-06-21 NOTE — Patient Instructions (Signed)
Thank you for choosing [facility] to provide your oncology and hematology care.   Should you have questions after your visit to the [facility] ([facility]), please contact this office at [phone] between 8:30 AM and 4:30 PM.  Voice mails left after 4:00 PM may not be returned until the following business day.  Calls received after 4:30 PM will be answered by an off-site Nurse Triage Line.    Prescription Refills:  Please have your pharmacy contact us directly for most prescription requests.  Contact the office directly for refills of narcotics (pain medications). Allow 48-72 hours for refills.  Appointments: Please contact the [facility] scheduling department [phone] for questions regarding [facility] appointment scheduling.  Contact the schedulers with any scheduling changes so that your appointment can be rescheduled in a timely manner.   Central Scheduling for [place] [phone] - Call to schedule procedures such as PET scans, CT scans, MRI, Ultrasound, etc.  To afford each patient quality time with our providers, please arrive 30 minutes before your scheduled appointment time.  If you arrive late for your appointment, you may be asked to reschedule.  We strive to give you quality time with our providers, and arriving late affects you and other patients whose appointments are after yours. If you are a no show for multiple scheduled visits, you may be dismissed from the clinic at the providers discretion.     Resources: [facility] Social Workers [phone] for additional information on assistance programs or assistance connecting with community support programs   [place] DSS  [phone]: Information regarding food stamps, Medicaid, and utility assistance [place] [phone]   [place] [employer]'s shared-ride transportation service for eligible riders who have a disability that prevents them from riding the fixed route bus.   Medicare Rights Center  800-333-4114 Helps people with Medicare understand their rights and benefits, navigate the Medicare system, and secure the quality healthcare they deserve [employer] 800-227-2345 Assists patients locate various types of support and financial assistance Cancer Care: 1-800-813-HOPE (4673) Provides financial assistance, online support groups, medication/co-pay assistance.   Transportation Assistance for appointments at [facility]: Transportation Coordinator [phone]  Again, thank you for choosing  Cancer Center for your care.       

## 2019-06-22 DIAGNOSIS — Z7901 Long term (current) use of anticoagulants: Secondary | ICD-10-CM | POA: Diagnosis not present

## 2019-06-22 DIAGNOSIS — D509 Iron deficiency anemia, unspecified: Secondary | ICD-10-CM | POA: Diagnosis not present

## 2019-06-22 DIAGNOSIS — M47816 Spondylosis without myelopathy or radiculopathy, lumbar region: Secondary | ICD-10-CM | POA: Diagnosis not present

## 2019-06-22 LAB — PROTEIN C, TOTAL: Protein C, Total: 110 % (ref 60–150)

## 2019-06-22 LAB — PROTEIN S, TOTAL: Protein S Ag, Total: 89 % (ref 60–150)

## 2019-06-22 LAB — PROTEIN C ACTIVITY: Protein C Activity: 117 % (ref 73–180)

## 2019-06-22 LAB — PROTEIN S ACTIVITY: Protein S Activity: 60 % — ABNORMAL LOW (ref 63–140)

## 2019-06-22 LAB — LUPUS ANTICOAGULANT PANEL
DRVVT: 40.3 s (ref 0.0–47.0)
PTT Lupus Anticoagulant: 20.9 s (ref 0.0–51.9)

## 2019-06-22 LAB — HOMOCYSTEINE: Homocysteine: 13.4 umol/L (ref 0.0–19.2)

## 2019-06-23 LAB — BETA-2-GLYCOPROTEIN I ABS, IGG/M/A
Beta-2 Glyco I IgG: <9 GPI IgG units (ref 0–20)
Beta-2-Glycoprotein I IgA: <9 GPI IgA units (ref 0–25)
Beta-2-Glycoprotein I IgM: <9 GPI IgM units (ref 0–32)

## 2019-06-24 LAB — CARDIOLIPIN ANTIBODIES, IGG, IGM, IGA
Anticardiolipin IgA: <9 APL U/mL (ref 0–11)
Anticardiolipin IgG: <9 GPL U/mL (ref 0–14)
Anticardiolipin IgM: <9 MPL U/mL (ref 0–12)

## 2019-06-25 LAB — PROTHROMBIN GENE MUTATION

## 2019-06-28 LAB — FACTOR 5 LEIDEN

## 2019-07-08 DIAGNOSIS — D500 Iron deficiency anemia secondary to blood loss (chronic): Secondary | ICD-10-CM | POA: Diagnosis not present

## 2019-07-08 DIAGNOSIS — Z7901 Long term (current) use of anticoagulants: Secondary | ICD-10-CM | POA: Diagnosis not present

## 2019-07-20 DIAGNOSIS — D509 Iron deficiency anemia, unspecified: Secondary | ICD-10-CM | POA: Diagnosis not present

## 2019-07-23 DIAGNOSIS — D500 Iron deficiency anemia secondary to blood loss (chronic): Secondary | ICD-10-CM | POA: Diagnosis not present

## 2019-08-18 ENCOUNTER — Other Ambulatory Visit: Payer: Self-pay | Admitting: *Deleted

## 2019-08-18 DIAGNOSIS — D500 Iron deficiency anemia secondary to blood loss (chronic): Secondary | ICD-10-CM

## 2019-08-18 DIAGNOSIS — D6859 Other primary thrombophilia: Secondary | ICD-10-CM

## 2019-08-18 NOTE — Progress Notes (Signed)
HEMATOLOGY/ONCOLOGY CONSULTATION NOTE  Date of Service: 08/19/2019  Patient Care Team: Lawerance Cruel, MD as PCP - General (Family Medicine)  CHIEF COMPLAINTS/PURPOSE OF CONSULTATION:  Hx of DVT Iron deficiency Anemia HISTORY OF PRESENTING ILLNESS:   Lisa Owens is a wonderful 78 y.o. female who has been referred to [name] by Dr Tana Coast for evaluation and management of recurrent DVT. The patient's last visit with [name] was on 06/21/19. The pt reports that she is doing well overall.  The pt reports she is fine. Her energy levels have been low. She is not having leg swelling or discomfort. Pt is tolerating ELIQUIS well. She is taking and iron supplement and calcium supplement.    Lab results today (08/19/19) of CBC w/diff and CMP is as follows: all values are WNL except for WBC at 11.5, Hemoglobin at 11.7, MCV at 78.8, MCH at 23.4, MCHC at 29.7, RDW at 23.2, Neutro Abs at 8.2K, Monocytes Abs at 1.2K, Albumin at 3.4 08/19/19 of AFP Tumor Marker at 3.4 08/19/19 of Ferritin at 14 06/21/19 of Antithrombin III at 91 06/21/19 of Cardiolipin Antibodies, IgG, IgM, IgA is as follows: all values are WNL 06/21/19 of Prothrombin Gene Mutation is negative  06/21/19 of factor 5 leiden is negative 06/21/19 of Homocysteine at 13.4 06/21/19 of beta-2-glycoprotein I abs, IgG/M/A is as follows; all values are WNL 06/21/19 of Lupus anticoagulant panel: is as follows: all values are WNL 06/21/19 of Protein S, Total at 89 06/21/19 of Protein S, Activity at 60 06/21/19 of Protein C, total at 110 06/21/19 of Protein C, Activity at 117  On review of systems, pt reports low energy levels, moving bowls regularly and denies black or blood in the stool, pedal edema or leg discomfort and any other symptoms.    MEDICAL HISTORY:  Past Medical History:  Diagnosis Date  . Arthritis   . Diverticulitis 01/2018  . DVT (deep venous thrombosis) ([place])     SURGICAL HISTORY: Past Surgical History:  Procedure  Laterality Date  . BUNIONECTOMY  2001   along with hammer toe  . CHOLECYSTECTOMY  2007  . COLONOSCOPY WITH PROPOFOL N/A 01/23/2018   Procedure: COLONOSCOPY WITH PROPOFOL;  Surgeon: Clarene Essex, MD;  Location: [facility];  Service: Endoscopy;  Laterality: N/A;  . ESOPHAGOGASTRODUODENOSCOPY (EGD) WITH PROPOFOL N/A 01/22/2018   Procedure: ESOPHAGOGASTRODUODENOSCOPY (EGD) WITH PROPOFOL;  Surgeon: Clarene Essex, MD;  Location: [facility];  Service: Endoscopy;  Laterality: N/A;  . ESOPHAGOGASTRODUODENOSCOPY (EGD) WITH PROPOFOL N/A 06/11/2019   Procedure: ESOPHAGOGASTRODUODENOSCOPY (EGD) WITH PROPOFOL;  Surgeon: Clarene Essex, MD;  Location: [facility];  Service: Endoscopy;  Laterality: N/A;  . EYE SURGERY  2011   bilateral cataract  . [place]  2001  . SHOULDER OPEN ROTATOR CUFF REPAIR  09/19/2011   Procedure: ROTATOR CUFF REPAIR SHOULDER OPEN;  Surgeon: Johnn Hai, MD;  Location: [facility];  Service: Orthopedics;  Laterality: Right;  . SUBACROMIAL DECOMPRESSION  09/19/2011   Procedure: SUBACROMIAL DECOMPRESSION;  Surgeon: Johnn Hai, MD;  Location: [facility];  Service: Orthopedics;  Laterality: Right;  . tummy tuck  2006  . UMBILICAL HERNIA REPAIR  2007   along with gallbladder    SOCIAL HISTORY: Social History   Socioeconomic History  . Marital status: Married    Spouse name: [name]  . Number of children: 2  . Years of education: 21  . Highest education level: Not on file  Occupational History  . Occupation: Retired  Tobacco Use  . Smoking status:  Never Smoker  . Smokeless tobacco: Never Used  Substance and Sexual Activity  . Alcohol use: Yes    Alcohol/week: 1.0 standard drinks    Types: 1 Glasses of wine per week    Comment: socially  . Drug use: No  . Sexual activity: Not on file  Other Topics Concern  . Not on file  Social History Narrative  . Not on file   Social Determinants of Health   Financial Resource Strain:   . Difficulty of Paying Living Expenses:    Food Insecurity:   . Worried About Charity fundraiser in the Last Year:   . Arboriculturist in the Last Year:   Transportation Needs:   . Film/video editor (Medical):   Marland Kitchen Lack of Transportation (Non-Medical):   Physical Activity:   . Days of Exercise per Week:   . Minutes of Exercise per Session:   Stress:   . Feeling of Stress :   Social Connections:   . Frequency of Communication with Friends and Family:   . Frequency of Social Gatherings with Friends and Family:   . Attends Religious Services:   . Active Member of Clubs or Organizations:   . Attends Archivist Meetings:   Marland Kitchen Marital Status:   Intimate Partner Violence:   . Fear of Current or Ex-Partner:   . Emotionally Abused:   Marland Kitchen Physically Abused:   . Sexually Abused:     FAMILY HISTORY: No family history on file.  ALLERGIES:  has No Known Allergies.  MEDICATIONS:  Current Outpatient Medications  Medication Sig Dispense Refill  . amLODipine (NORVASC) 2.5 MG tablet Take 1 tablet (2.5 mg total) by mouth daily. 30 tablet 3  . apixaban (ELIQUIS) 2.5 MG TABS tablet Take 1 tablet (2.5 mg total) by mouth 2 (two) times daily. 60 tablet 4  . famotidine (PEPCID) 20 MG tablet Take 20 mg by mouth every other day.    . pantoprazole (PROTONIX) 40 MG tablet Take 1 tablet (40 mg total) by mouth daily. 30 tablet 4   No current facility-administered medications for this visit.    REVIEW OF SYSTEMS:    A 10+ POINT REVIEW OF SYSTEMS WAS OBTAINED including neurology, dermatology, psychiatry, cardiac, respiratory, lymph, extremities, GI, GU, Musculoskeletal, constitutional, breasts, reproductive, HEENT.  All pertinent positives are noted in the HPI.  All others are negative.    PHYSICAL EXAMINATION: ECOG PERFORMANCE STATUS: 3 - Symptomatic, >50% confined to bed  . Vitals:   08/19/19 1026  BP: (!) 149/88  Pulse: 96  Resp: 18  Temp: 98.3 F (36.8 C)  SpO2: 97%   Filed Weights   08/19/19 1026  Weight: 223 lb  9.6 oz (101.4 kg)   .Body mass index is 39.61 kg/m.  Exam was given in a chair   GENERAL:alert, in no acute distress and comfortable SKIN: no acute rashes, no significant lesions EYES: conjunctiva are pink and non-injected, sclera anicteric OROPHARYNX: MMM, no exudates, no oropharyngeal erythema or ulceration NECK: supple, no JVD LYMPH:  no palpable lymphadenopathy in the cervical, axillary or inguinal regions LUNGS: clear to auscultation b/l with normal respiratory effort HEART: regular rate & rhythm ABDOMEN:  normoactive bowel sounds , non tender, not distended. Extremity: no pedal edema PSYCH: alert & oriented x 3 with fluent speech NEURO: no focal motor/sensory deficits   LABORATORY DATA:  I have reviewed the data as listed  . CBC Latest Ref Rng & Units 08/19/2019 06/21/2019 06/12/2019  WBC 4.0 -  10.5 K/uL 11.5(H) 10.6(H) -  Hemoglobin 12.0 - 15.0 g/dL 11.7(L) 10.1(L) 8.6(L)  Hematocrit 36.0 - 46.0 % 39.4 33.0(L) 29.1(L)  Platelets 150 - 400 K/uL 325 498(H) -    . CMP Latest Ref Rng & Units 08/19/2019 06/21/2019 06/11/2019  Glucose 70 - 99 mg/dL 80 100(H) 102(H)  BUN 8 - 23 mg/dL 18 17 19   Creatinine 0.44 - 1.00 mg/dL 0.82 0.77 0.73  Sodium 135 - 145 mmol/L 142 143 140  Potassium 3.5 - 5.1 mmol/L 4.3 4.0 3.9  Chloride 98 - 111 mmol/L 108 108 111  CO2 22 - 32 mmol/L 26 25 23   Calcium 8.9 - 10.3 mg/dL 9.3 8.8(L) 8.6(L)  Total Protein 6.5 - 8.1 g/dL 7.9 7.9 6.4(L)  Total Bilirubin 0.3 - 1.2 mg/dL 0.4 0.3 0.9  Alkaline Phos 38 - 126 U/L 73 65 45  AST 15 - 41 U/L 16 15 13(L)  ALT 0 - 44 U/L 12 9 10      RADIOGRAPHIC STUDIES: I have personally reviewed the radiological images as listed and agreed with the findings in the report. No results found.  ASSESSMENT & PLAN:   79 yo with   1) Recurrent unprovoked VTE. Risk factors - age, obesity (.Body mass index is 39.61 kg/m.), relative immobility,peripheral venous insufficiency No clear acute provoking   2) GI bleeding due  to cameron ulcers related to hiatal hernia  PLAN: -Discussed pt labwork today, 08/19/19; of CBC w/diff and CMP is as follows: all values are WNL except for WBC at 11.5, Hemoglobin at 11.7, MCV at 78.8, MCH at 23.4, MCHC at 29.7, RDW at 23.2, Neutro Abs at 8.2K, Monocytes Abs at 1.2K, Albumin at 3.4 -Discussed 08/19/19 of AFP Tumor Marker at 3.4 -Discussed 08/19/19 of Ferritin at 14 -Discussed 06/21/19 of Antithrombin III at 91 -Discussed 06/21/19 of Cardiolipin Antibodies, IgG, IgM, IgA is as follows: all values are WNL -Discussed 06/21/19 of Prothrombin Gene Mutation is negative  -Discussed 06/21/19 of factor 5 leiden is negative -Discussed 06/21/19 of Homocysteine at 13.4 -Discussed 06/21/19 of beta-2-glycoprotein I abs, IgG/M/A is as follows; all values are WNL -Discussed 06/21/19 of Lupus anticoagulant panel: is as follows: all values are WNL -Discussed 06/21/19 of Protein S, Total at 89 -Discussed 06/21/19 of Protein S, Activity at 60 -Discussed 06/21/19 of Protein C, total at 110 -Discussed 06/21/19 of Protein C, Activity at 117 -Recommended pt stay well hydrated (drink 48-64 oz of water per day), wear compression socks, move about every hour when sitting, stay as active as possible and avoid long-distance travel when possible -Advised pt continue 2.5 mg Eliquis BID unless overt GI bleeding -Advised pt avoid OTC pain medication like Ibuprofen. -Advised pt to immediately discontinue Eliquis and go to ED in the setting of another bleeding event -Recommends continuing taking Iron supplement - advised on iron pills making stool dark in color  -Recommends increasing Iron supplement to 2 pills a day -Pt prefers Iron pills over IV Iron -Will see back in 4 months    FOLLOW UP: RTC with Dr Irene Limbo with labs in 4 months   The total time spent in the appt was 30 minutes and more than 50% was on counseling and direct patient cares.  All of the patient's questions were answered with apparent  satisfaction. The patient knows to call the clinic with any problems, questions or concerns.     Sullivan Lone MD [place] AAHIVMS [facility] Hematology/Oncology Physician [place]  (Office):  [phone] (Work cell):  [phone] (Fax):           [phone]  08/19/2019 3:54 PM  I, Dawayne Cirri am acting as a scribe for Dr. Sullivan Lone.   .I have reviewed the above documentation for accuracy and completeness, and I agree with the above. Brunetta Genera MD

## 2019-08-19 ENCOUNTER — Other Ambulatory Visit: Payer: Self-pay

## 2019-08-19 ENCOUNTER — Telehealth: Payer: Self-pay | Admitting: Hematology

## 2019-08-19 ENCOUNTER — Inpatient Hospital Stay: Payer: Medicare HMO | Attending: Hematology | Admitting: Hematology

## 2019-08-19 ENCOUNTER — Inpatient Hospital Stay: Payer: Medicare HMO

## 2019-08-19 VITALS — BP 149/88 | HR 96 | Temp 98.3°F | Resp 18 | Ht 63.0 in | Wt 223.6 lb

## 2019-08-19 DIAGNOSIS — Z8719 Personal history of other diseases of the digestive system: Secondary | ICD-10-CM | POA: Diagnosis not present

## 2019-08-19 DIAGNOSIS — K449 Diaphragmatic hernia without obstruction or gangrene: Secondary | ICD-10-CM | POA: Insufficient documentation

## 2019-08-19 DIAGNOSIS — Z7901 Long term (current) use of anticoagulants: Secondary | ICD-10-CM | POA: Diagnosis not present

## 2019-08-19 DIAGNOSIS — D6859 Other primary thrombophilia: Secondary | ICD-10-CM

## 2019-08-19 DIAGNOSIS — Z79899 Other long term (current) drug therapy: Secondary | ICD-10-CM | POA: Insufficient documentation

## 2019-08-19 DIAGNOSIS — D509 Iron deficiency anemia, unspecified: Secondary | ICD-10-CM | POA: Diagnosis not present

## 2019-08-19 DIAGNOSIS — D500 Iron deficiency anemia secondary to blood loss (chronic): Secondary | ICD-10-CM | POA: Diagnosis not present

## 2019-08-19 DIAGNOSIS — Z86718 Personal history of other venous thrombosis and embolism: Secondary | ICD-10-CM | POA: Diagnosis not present

## 2019-08-19 DIAGNOSIS — M129 Arthropathy, unspecified: Secondary | ICD-10-CM | POA: Insufficient documentation

## 2019-08-19 LAB — CBC WITH DIFFERENTIAL (CANCER CENTER ONLY)
Abs Immature Granulocytes: 0.06 10*3/uL (ref 0.00–0.07)
Basophils Absolute: 0.1 10*3/uL (ref 0.0–0.1)
Basophils Relative: 0 %
Eosinophils Absolute: 0.2 10*3/uL (ref 0.0–0.5)
Eosinophils Relative: 1 %
HCT: 39.4 % (ref 36.0–46.0)
Hemoglobin: 11.7 g/dL — ABNORMAL LOW (ref 12.0–15.0)
Immature Granulocytes: 1 %
Lymphocytes Relative: 17 %
Lymphs Abs: 1.9 10*3/uL (ref 0.7–4.0)
MCH: 23.4 pg — ABNORMAL LOW (ref 26.0–34.0)
MCHC: 29.7 g/dL — ABNORMAL LOW (ref 30.0–36.0)
MCV: 78.8 fL — ABNORMAL LOW (ref 80.0–100.0)
Monocytes Absolute: 1.2 10*3/uL — ABNORMAL HIGH (ref 0.1–1.0)
Monocytes Relative: 10 %
Neutro Abs: 8.2 10*3/uL — ABNORMAL HIGH (ref 1.7–7.7)
Neutrophils Relative %: 71 %
Platelet Count: 325 10*3/uL (ref 150–400)
RBC: 5 MIL/uL (ref 3.87–5.11)
RDW: 23.2 % — ABNORMAL HIGH (ref 11.5–15.5)
WBC Count: 11.5 10*3/uL — ABNORMAL HIGH (ref 4.0–10.5)
nRBC: 0 % (ref 0.0–0.2)

## 2019-08-19 LAB — CMP (CANCER CENTER ONLY)
ALT: 12 U/L (ref 0–44)
AST: 16 U/L (ref 15–41)
Albumin: 3.4 g/dL — ABNORMAL LOW (ref 3.5–5.0)
Alkaline Phosphatase: 73 U/L (ref 38–126)
Anion gap: 8 (ref 5–15)
BUN: 18 mg/dL (ref 8–23)
CO2: 26 mmol/L (ref 22–32)
Calcium: 9.3 mg/dL (ref 8.9–10.3)
Chloride: 108 mmol/L (ref 98–111)
Creatinine: 0.82 mg/dL (ref 0.44–1.00)
GFR, Est AFR Am: >60 mL/min (ref >60)
GFR, Estimated: >60 mL/min (ref >60)
Glucose, Bld: 80 mg/dL (ref 70–99)
Potassium: 4.3 mmol/L (ref 3.5–5.1)
Sodium: 142 mmol/L (ref 135–145)
Total Bilirubin: 0.4 mg/dL (ref 0.3–1.2)
Total Protein: 7.9 g/dL (ref 6.5–8.1)

## 2019-08-19 LAB — FERRITIN: Ferritin: 14 ng/mL (ref 11–307)

## 2019-08-19 NOTE — Telephone Encounter (Signed)
Scheduled appt per 4/8 los.  Printed calendar and avs. 

## 2019-08-20 LAB — AFP TUMOR MARKER: AFP, Serum, Tumor Marker: 3.4 ng/mL (ref 0.0–8.3)

## 2019-09-24 DIAGNOSIS — D509 Iron deficiency anemia, unspecified: Secondary | ICD-10-CM | POA: Diagnosis not present

## 2019-09-24 LAB — CBC AND DIFFERENTIAL
HCT: 41 (ref 36–46)
Hemoglobin: 13.4 (ref 12.0–16.0)
Platelets: 262 (ref 150–399)
WBC: 9.9

## 2019-09-24 LAB — CBC: RBC: 5.05 (ref 3.87–5.11)

## 2019-09-24 LAB — IRON,TIBC AND FERRITIN PANEL
%SAT: 19
Ferritin: 25.1
Iron: 70
TIBC: 361

## 2019-10-06 DIAGNOSIS — R05 Cough: Secondary | ICD-10-CM | POA: Diagnosis not present

## 2019-10-06 DIAGNOSIS — R062 Wheezing: Secondary | ICD-10-CM | POA: Diagnosis not present

## 2019-10-06 DIAGNOSIS — R1310 Dysphagia, unspecified: Secondary | ICD-10-CM | POA: Diagnosis not present

## 2019-11-22 DIAGNOSIS — D509 Iron deficiency anemia, unspecified: Secondary | ICD-10-CM | POA: Diagnosis not present

## 2019-11-22 DIAGNOSIS — Z86718 Personal history of other venous thrombosis and embolism: Secondary | ICD-10-CM | POA: Diagnosis not present

## 2019-12-07 ENCOUNTER — Ambulatory Visit (INDEPENDENT_AMBULATORY_CARE_PROVIDER_SITE_OTHER): Payer: Medicare HMO | Admitting: Family Medicine

## 2019-12-07 ENCOUNTER — Other Ambulatory Visit: Payer: Self-pay

## 2019-12-07 ENCOUNTER — Encounter (INDEPENDENT_AMBULATORY_CARE_PROVIDER_SITE_OTHER): Payer: Self-pay | Admitting: Family Medicine

## 2019-12-07 VITALS — BP 153/99 | HR 82 | Temp 98.0°F | Ht 63.0 in | Wt 218.0 lb

## 2019-12-07 DIAGNOSIS — Z1331 Encounter for screening for depression: Secondary | ICD-10-CM | POA: Diagnosis not present

## 2019-12-07 DIAGNOSIS — I10 Essential (primary) hypertension: Secondary | ICD-10-CM | POA: Diagnosis not present

## 2019-12-07 DIAGNOSIS — D500 Iron deficiency anemia secondary to blood loss (chronic): Secondary | ICD-10-CM

## 2019-12-07 DIAGNOSIS — Z86718 Personal history of other venous thrombosis and embolism: Secondary | ICD-10-CM

## 2019-12-07 DIAGNOSIS — E559 Vitamin D deficiency, unspecified: Secondary | ICD-10-CM

## 2019-12-07 DIAGNOSIS — M174 Other bilateral secondary osteoarthritis of knee: Secondary | ICD-10-CM | POA: Diagnosis not present

## 2019-12-07 DIAGNOSIS — R5383 Other fatigue: Secondary | ICD-10-CM | POA: Diagnosis not present

## 2019-12-07 DIAGNOSIS — D508 Other iron deficiency anemias: Secondary | ICD-10-CM | POA: Diagnosis not present

## 2019-12-07 DIAGNOSIS — R0602 Shortness of breath: Secondary | ICD-10-CM | POA: Diagnosis not present

## 2019-12-07 DIAGNOSIS — Z6838 Body mass index (BMI) 38.0-38.9, adult: Secondary | ICD-10-CM | POA: Diagnosis not present

## 2019-12-07 DIAGNOSIS — Z0289 Encounter for other administrative examinations: Secondary | ICD-10-CM

## 2019-12-07 DIAGNOSIS — Z79899 Other long term (current) drug therapy: Secondary | ICD-10-CM

## 2019-12-07 NOTE — Progress Notes (Signed)
Chief Complaint:   OBESITY Lisa Owens (MR# [id number]) is a 79 y.o. female who presents for evaluation and treatment of obesity and related comorbidities. Current BMI is Body mass index is 38.62 kg/m. Lisa Owens has been struggling with her weight for many years and has been unsuccessful in either losing weight, maintaining weight loss, or reaching her healthy weight goal.  Lisa Owens is currently in the action stage of change and ready to dedicate time achieving and maintaining a healthier weight. Lisa Owens is interested in becoming our patient and working on intensive lifestyle modifications including (but not limited to) diet and exercise for weight loss.  Lisa Owens lives with her spouse, [name], and son, Lisa Owens (65). She says she craves sweets and chips.  She has used Weight Watchers and Nutrisystem in the past, losing 28 pounds and 17 pounds, respectively, but did not keep it off.  She has multiple questions about whether or not she should be on Eliquis or or PPI, etc. today.  She reads a lot on the Internet and admits she tries to be "Dr. Mary Sella" for herself.  She states she likes to learn and do what is best for her.    Joei's habits were reviewed today and are as follows: Her family eats meals together, she thinks her family will eat healthier with her, her desired weight loss is 73 pounds, she started gaining weight after age 33, her heaviest weight ever was 228 pounds, she craves sweets, she snacks frequently in the evenings, she wakes up sometimes in the middle of the night hungry, she sometimes makes poor food choices, she sometimes eats larger portions than normal and she struggles with emotional eating.  Depression Screen Lisa Owens's Food and Mood (modified PHQ-9) score was 4.  Depression screen PHQ 2/9 12/07/2019  Decreased Interest 1  Down, Depressed, Hopeless 1  PHQ - 2 Score 2  Altered sleeping 0  Tired, decreased energy 1  Change in appetite 1  Feeling bad or failure  about yourself  0  Trouble concentrating 0  Moving slowly or fidgety/restless 0  Suicidal thoughts 0  PHQ-9 Score 4  Difficult doing work/chores Not difficult at all   Subjective:   1. Other fatigue Kamori admits to daytime somnolence and denies waking up still tired. Patent has a history of symptoms of daytime fatigue and snoring. Lisa Owens generally gets 3 or 4 hours of sleep per night, and states that she has poor sleep quality. Snoring is present. Apneic episodes are not present.  This is currently worse than her chronic feelings of fatigue since getting older. Epworth Sleepiness Score is 5.  2. SOB (shortness of breath) on exertion Lisa Owens notes increasing shortness of breath with exercising and seems to be worsening over time with weight gain. She notes getting out of breath sooner with activity than she used to. This has gotten worse recently. Lisa Owens denies shortness of breath at rest or orthopnea.  3. Essential hypertension Review: taking medications as instructed, no medication side effects noted, no chest pain on exertion, no dyspnea on exertion, no swelling of ankles.  She does not check her blood pressure at home.  BP Readings from Last 3 Encounters:  12/07/19 (!) 153/99  08/19/19 (!) 149/88  06/21/19 (!) 169/87   4. Anemia due to GI blood loss [name] is not a vegetarian.  She does not have a history of weight loss surgery.  Her anemia is secondary to a GI bleed.  She has been hospitalized twice in the past for  gastric bleeding.  She was also on Eliquis at the time.  She was told to take an iron supplement and pantoprazole for gastric ulcers.  CBC Latest Ref Rng & Units 12/07/2019 08/19/2019 06/21/2019  WBC 3.4 - 10.8 x10E3/uL 9.1 11.5(H) 10.6(H)  Hemoglobin 11.1 - 15.9 g/dL 15.0 11.7(L) 10.1(L)  Hematocrit 34.0 - 46.6 % 44.5 39.4 33.0(L)  Platelets 150 - 450 x10E3/uL 257 325 498(H)   Lab Results  Component Value Date   IRON 9 (L) 06/21/2019   TIBC 454 (H)  06/21/2019   FERRITIN 14 08/19/2019   Lab Results  Component Value Date   VITAMINB12 313 12/07/2019   5. Other secondary osteoarthritis of both knees Keatyn has osteoarthritis in bilateral knees.  6.  Vitamin D deficiency She says she was told she was deficient in the past. She is currently taking no vitamin D supplement.   7. History of DVT (deep vein thrombosis) [name] has had a total of 5 DVTs in bilateral lower extremities.  Management per PCP.  She has concerns about Eliquis and saw her PCP for this, but is still uncertain what is best for her.  8. High risk medication use On Eliquis per PCP.  9. Depression screening Lisa Owens was screened for depression as part of her new patient workup.  PHQ-9 was 4.  Assessment/Plan:   1. Other fatigue Lisa Owens does feel that her weight is causing her energy to be lower than it should be. Fatigue may be related to obesity, depression or many other causes. Labs will be ordered, and in the meanwhile, Lisa Owens will focus on self care including making healthy food choices, increasing physical activity and focusing on stress reduction.  - EKG 12-Lead - Folate - Hemoglobin A1c - Insulin, random - T3 - T4 - TSH - VITAMIN D 25 Hydroxy (Vit-D Deficiency, Fractures)  2. SOB (shortness of breath) on exertion Lisa Owens does feel that she gets out of breath more easily that she used to when she exercises. Lisa Owens's shortness of breath appears to be obesity related and exercise induced. She has agreed to work on weight loss and gradually increase exercise to treat her exercise induced shortness of breath. Will continue to monitor closely.  3. Essential hypertension Lisa Owens is working on healthy weight loss and exercise to improve blood pressure control. We will watch for signs of hypotension as she continues her lifestyle modifications.  Blood pressure is not at goal  I told the patient to check her blood pressure at home and bring her  blood pressure log into her PCP with plans to discuss her chronic conditions as she only goes once a year for her Medicare Wellness Exam. - Hemoglobin A1c - Insulin, random - Comprehensive metabolic panel - Lipid panel  4. Anemia due to GI blood loss Will check B12, folate, CBC.  Follow-up with PCP/GI regarding chronic treatment plan, but we will ensure levels are within normal limits and stable today. Orders and follow up as documented in patient record.  Counseling . Iron is essential for our bodies to make red blood cells.  Reasons that someone may be deficient include: an iron-deficient diet (more likely in those following vegan or vegetarian diets), women with heavy menses, patients with GI disorders or poor absorption, patients that have had bariatric surgery, frequent blood donors, patients with cancer, and patients with heart disease.   Marden Noble foods include dark leafy greens, red and white meats, eggs, seafood, and beans.   . Certain foods and drinks prevent your body  from absorbing iron properly. Avoid eating these foods in the same meal as iron-rich foods or with iron supplements. These foods include: coffee, black tea, and red wine; milk, dairy products, and foods that are high in calcium; beans and soybeans; whole grains.  . Constipation can be a side effect of iron supplementation. Increased water and fiber intake are helpful. Water goal: > 2 liters/day. Fiber goal: > 25 grams/day. - Vitamin B12 - CBC with Differential/Platelet - Folate  5. Other secondary osteoarthritis of both knees Will follow because mobility and pain control are important for weight management.  Treatment per Orthopedics.  Continue prudent nutritional plan, weight loss.  6. Vitamin D deficiency Will check vitamin D level today.  7. History of DVT (deep vein thrombosis) Check labs.  Treatment plan per PCP/specialists.  I recommended to the patient that she makes a follow-up with her PCP in the near  future to discuss her chronic medications.  8. High risk medication use Check labs, follow-up.  9. Depression screening Depression screen was negative.  10. Class 2 severe obesity with serious comorbidity and body mass index (BMI) of 38.0 to 38.9 in adult, unspecified obesity type (HCC) Sibley is currently in the action stage of change and her goal is to continue with weight loss efforts. I recommend Zaydah begin the structured treatment plan as follows:  She has agreed to the Category 1 Plan.  Exercise goals: As is.   Behavioral modification strategies: increasing lean protein intake, increasing water intake, meal planning and cooking strategies and planning for success.  I recommend that she make a follow-up with her PCP in the near future to discuss her additional concerns about her desire to come off her medications.  She was informed of the importance of frequent follow-up visits to maximize her success with intensive lifestyle modifications for her multiple health conditions. She was informed we would discuss her lab results at her next visit unless there is a critical issue that needs to be addressed sooner. Charmian agreed to keep her next visit at the agreed upon time to discuss these results.  Objective:   Blood pressure (!) 153/99, pulse 82, temperature 98 F (36.7 C), temperature source Oral, height 5\' 3"  (1.6 m), weight (!) 218 lb (98.9 kg), SpO2 94 %. Body mass index is 38.62 kg/m.  EKG: Normal sinus rhythm, rate 86 bpm.  Indirect Calorimeter completed today shows a VO2 of 193 and a REE of 1343.  Her calculated basal metabolic rate is 6948 thus her basal metabolic rate is worse than expected.  General: Cooperative, alert, well developed, in no acute distress. HEENT: Conjunctivae and lids unremarkable. Cardiovascular: Regular rhythm.  Lungs: Normal work of breathing. Neurologic: No focal deficits.   Lab Results  Component Value Date   CREATININE 0.84  12/07/2019   BUN 10 12/07/2019   NA 140 12/07/2019   K 4.7 12/07/2019   CL 102 12/07/2019   CO2 26 12/07/2019   Lab Results  Component Value Date   ALT 10 12/07/2019   AST 16 12/07/2019   ALKPHOS 74 12/07/2019   BILITOT 0.5 12/07/2019   Lab Results  Component Value Date   WBC 9.1 12/07/2019   HGB 15.0 12/07/2019   HCT 44.5 12/07/2019   MCV 87 12/07/2019   PLT 257 12/07/2019   Lab Results  Component Value Date   IRON 9 (L) 06/21/2019   TIBC 454 (H) 06/21/2019   FERRITIN 14 08/19/2019   Obesity Behavioral Intervention Visit Documentation for Insurance:  Approximately 15 minutes were spent on the discussion below.  ASK: We discussed the diagnosis of obesity with [name] today and Johnice agreed to give [name] permission to discuss obesity behavioral modification therapy today.  ASSESS: Tongela has the diagnosis of obesity and her BMI today is 38.7. Tawan is in the action stage of change.   ADVISE: Lillian was educated on the multiple health risks of obesity as well as the benefit of weight loss to improve her health. She was advised of the need for long term treatment and the importance of lifestyle modifications to improve her current health and to decrease her risk of future health problems.  AGREE: Multiple dietary modification options and treatment options were discussed and Kenli agreed to follow the recommendations documented in the above note.  ARRANGE: Lakysha was educated on the importance of frequent visits to treat obesity as outlined per CMS and USPSTF guidelines and agreed to schedule her next follow up appointment today.  Attestation Statements:   Reviewed by clinician on day of visit: allergies, medications, problem list, medical history, surgical history, family history, social history, and previous encounter notes.  I, Water quality scientist, CMA, am acting as Location manager for [employer], DO.  I have reviewed the above documentation for  accuracy and completeness, and I agree with the above. Mellody Dance, DO

## 2019-12-08 LAB — COMPREHENSIVE METABOLIC PANEL
ALT: 10 IU/L (ref 0–32)
AST: 16 IU/L (ref 0–40)
Albumin/Globulin Ratio: 1.2 (ref 1.2–2.2)
Albumin: 4.3 g/dL (ref 3.7–4.7)
Alkaline Phosphatase: 74 IU/L (ref 48–121)
BUN/Creatinine Ratio: 12 (ref 12–28)
BUN: 10 mg/dL (ref 8–27)
Bilirubin Total: 0.5 mg/dL (ref 0.0–1.2)
CO2: 26 mmol/L (ref 20–29)
Calcium: 9.7 mg/dL (ref 8.7–10.3)
Chloride: 102 mmol/L (ref 96–106)
Creatinine, Ser: 0.84 mg/dL (ref 0.57–1.00)
GFR calc Af Amer: 76 mL/min/{1.73_m2} (ref >59)
GFR calc non Af Amer: 66 mL/min/{1.73_m2} (ref >59)
Globulin, Total: 3.5 g/dL (ref 1.5–4.5)
Glucose: 94 mg/dL (ref 65–99)
Potassium: 4.7 mmol/L (ref 3.5–5.2)
Sodium: 140 mmol/L (ref 134–144)
Total Protein: 7.8 g/dL (ref 6.0–8.5)

## 2019-12-08 LAB — CBC WITH DIFFERENTIAL/PLATELET
Basophils Absolute: 0.1 10*3/uL (ref 0.0–0.2)
Basos: 1 %
EOS (ABSOLUTE): 0.2 10*3/uL (ref 0.0–0.4)
Eos: 2 %
Hematocrit: 44.5 % (ref 34.0–46.6)
Hemoglobin: 15 g/dL (ref 11.1–15.9)
Immature Grans (Abs): 0.1 10*3/uL (ref 0.0–0.1)
Immature Granulocytes: 1 %
Lymphocytes Absolute: 2 10*3/uL (ref 0.7–3.1)
Lymphs: 22 %
MCH: 29.4 pg (ref 26.6–33.0)
MCHC: 33.7 g/dL (ref 31.5–35.7)
MCV: 87 fL (ref 79–97)
Monocytes Absolute: 0.8 10*3/uL (ref 0.1–0.9)
Monocytes: 9 %
Neutrophils Absolute: 6.1 10*3/uL (ref 1.4–7.0)
Neutrophils: 65 %
Platelets: 257 10*3/uL (ref 150–450)
RBC: 5.1 x10E6/uL (ref 3.77–5.28)
RDW: 14.6 % (ref 11.7–15.4)
WBC: 9.1 10*3/uL (ref 3.4–10.8)

## 2019-12-08 LAB — INSULIN, RANDOM: INSULIN: 11.3 u[IU]/mL (ref 2.6–24.9)

## 2019-12-08 LAB — VITAMIN B12: Vitamin B-12: 313 pg/mL (ref 232–1245)

## 2019-12-08 LAB — T3: T3, Total: 128 ng/dL (ref 71–180)

## 2019-12-08 LAB — LIPID PANEL
Chol/HDL Ratio: 4.7 ratio — ABNORMAL HIGH (ref 0.0–4.4)
Cholesterol, Total: 262 mg/dL — ABNORMAL HIGH (ref 100–199)
HDL: 56 mg/dL (ref >39)
LDL Chol Calc (NIH): 192 mg/dL — ABNORMAL HIGH (ref 0–99)
Triglycerides: 82 mg/dL (ref 0–149)
VLDL Cholesterol Cal: 14 mg/dL (ref 5–40)

## 2019-12-08 LAB — VITAMIN D 25 HYDROXY (VIT D DEFICIENCY, FRACTURES): Vit D, 25-Hydroxy: 19 ng/mL — ABNORMAL LOW (ref 30.0–100.0)

## 2019-12-08 LAB — FOLATE: Folate: 9.7 ng/mL (ref >3.0)

## 2019-12-08 LAB — HEMOGLOBIN A1C
Est. average glucose Bld gHb Est-mCnc: 123 mg/dL
Hgb A1c MFr Bld: 5.9 % — ABNORMAL HIGH (ref 4.8–5.6)

## 2019-12-08 LAB — TSH: TSH: 2.85 u[IU]/mL (ref 0.450–4.500)

## 2019-12-08 LAB — T4: T4, Total: 7.8 ug/dL (ref 4.5–12.0)

## 2019-12-10 DIAGNOSIS — Z86718 Personal history of other venous thrombosis and embolism: Secondary | ICD-10-CM | POA: Diagnosis not present

## 2019-12-10 DIAGNOSIS — D6869 Other thrombophilia: Secondary | ICD-10-CM | POA: Diagnosis not present

## 2019-12-10 DIAGNOSIS — I10 Essential (primary) hypertension: Secondary | ICD-10-CM | POA: Diagnosis not present

## 2019-12-10 DIAGNOSIS — Z8719 Personal history of other diseases of the digestive system: Secondary | ICD-10-CM | POA: Diagnosis not present

## 2019-12-20 ENCOUNTER — Ambulatory Visit: Payer: Medicare HMO | Admitting: Hematology

## 2019-12-20 ENCOUNTER — Other Ambulatory Visit: Payer: Medicare HMO

## 2019-12-21 ENCOUNTER — Other Ambulatory Visit: Payer: Self-pay

## 2019-12-21 ENCOUNTER — Encounter (INDEPENDENT_AMBULATORY_CARE_PROVIDER_SITE_OTHER): Payer: Self-pay | Admitting: Family Medicine

## 2019-12-21 ENCOUNTER — Ambulatory Visit (INDEPENDENT_AMBULATORY_CARE_PROVIDER_SITE_OTHER): Payer: Medicare HMO | Admitting: Family Medicine

## 2019-12-21 VITALS — BP 114/74 | HR 82 | Temp 98.8°F | Ht 63.0 in | Wt 212.0 lb

## 2019-12-21 DIAGNOSIS — E559 Vitamin D deficiency, unspecified: Secondary | ICD-10-CM

## 2019-12-21 DIAGNOSIS — I10 Essential (primary) hypertension: Secondary | ICD-10-CM | POA: Diagnosis not present

## 2019-12-21 DIAGNOSIS — E7849 Other hyperlipidemia: Secondary | ICD-10-CM | POA: Diagnosis not present

## 2019-12-21 DIAGNOSIS — Z6837 Body mass index (BMI) 37.0-37.9, adult: Secondary | ICD-10-CM | POA: Diagnosis not present

## 2019-12-21 DIAGNOSIS — E66812 Obesity, class 2: Secondary | ICD-10-CM

## 2019-12-21 DIAGNOSIS — R7303 Prediabetes: Secondary | ICD-10-CM

## 2019-12-21 MED ORDER — VITAMIN D (ERGOCALCIFEROL) 1.25 MG (50000 UNIT) PO CAPS: 50000.0000 [IU] | ORAL_CAPSULE | ORAL | 0 refills | Status: DC

## 2019-12-22 NOTE — Progress Notes (Signed)
Chief Complaint:   OBESITY Jinan is here to discuss her progress with her obesity treatment plan along with follow-up of her obesity related diagnoses. Greydis is on the Category 1 Plan and states she is following her eating plan approximately 100% of the time. Wallace states she is exercising for 0 minutes 0 times per week.  Today's visit was #: 2 Starting weight: 218 lbs Starting date: 12/07/2019 Today's weight: 212 lbs Today's date: 12/21/2019 Total lbs lost to date: 6 lbs Total lbs lost since last in-office visit: 6 lbs  Interim History: This is Breella's first follow-up visit with me.  She is also here for lab review.  She says the plan is easy to follow.  She felt that her hunger and cravings are well-controlled and she has no concerns about the plan.  She says she likes it a lot.  She has no questions and no desire to change anything.  Subjective:   1. Essential hypertension Review: taking medications as instructed, no medication side effects noted, no chest pain on exertion, no dyspnea on exertion, no swelling of ankles.  Adline checked her blood pressure at home and it was 157/100, 181/99, 177/85, 159/90, etc., and she spoke with her PCP on 12/15/2019 and he increased her amlodipine to 5 mg instead of 2.5.  Her blood pressure is now running 136/86, 135/87, 142/77, 137/87.  BP Readings from Last 3 Encounters:  12/21/19 114/74  12/07/19 (!) 153/99  08/19/19 (!) 149/88   2. Vitamin D deficiency Emalynn's Vitamin D level was 19.0 on 12/07/2019. She is currently taking OTC vitamin D.  She is not sure of the dose. She denies nausea, vomiting or muscle weakness.  Her vitamin D level is still too low.  3. Prediabetes Lumi has a diagnosis of prediabetes based on her elevated HgA1c and was informed this puts her at greater risk of developing diabetes. She continues to work on diet and exercise to decrease her risk of diabetes. She denies nausea or  hypoglycemia.  Lab Results  Component Value Date   HGBA1C 5.9 (H) 12/07/2019   Lab Results  Component Value Date   INSULIN 11.3 12/07/2019   4. Other hyperlipidemia Dalphine has hyperlipidemia and has been trying to improve her cholesterol levels with intensive lifestyle modification including a low saturated fat diet, exercise and weight loss. She denies any chest pain, claudication or myalgias.  Her cholesterol has been high for many years.  She has declined medication in the past.  Lab Results  Component Value Date   ALT 10 12/07/2019   AST 16 12/07/2019   ALKPHOS 74 12/07/2019   BILITOT 0.5 12/07/2019   Lab Results  Component Value Date   CHOL 262 (H) 12/07/2019   HDL 56 12/07/2019   LDLCALC 192 (H) 12/07/2019   TRIG 82 12/07/2019   CHOLHDL 4.7 (H) 12/07/2019   Assessment/Plan:   1. Essential hypertension Discussed labs with patient today.  Denali is working on healthy weight loss and exercise to improve blood pressure control. We will watch for signs of hypotension as she continues her lifestyle modifications.  Continue medications per PCP.  Continue prudent nutritional plan and weight loss.  2. Vitamin D deficiency Discussed labs with patient today.  Low Vitamin D level contributes to fatigue and are associated with obesity, breast, and colon cancer. She agrees to start to take prescription Vitamin D @50 ,000 IU every week and will follow-up for routine testing of Vitamin D, at least 2-3 times per year  to avoid over-replacement.  Recheck levels in 3 months.  Continue prudent nutritional plan and weight loss. - Vitamin D, Ergocalciferol, (DRISDOL) 1.25 MG (50000 UNIT) CAPS capsule; Take 1 capsule (50,000 Units total) by mouth every 7 (seven) days.  Dispense: 4 capsule; Refill: 0  3. Prediabetes New.  Discussed labs with patient today.  Sheretta will continue to work on weight loss, exercise, and decreasing simple carbohydrates to help decrease the risk of diabetes.   Recheck A1c and fasting insulin in 3 months.  Continue prudent nutritional plan, weight loss.  Extensive education done and handouts given.  4. Other hyperlipidemia Discussed labs with patient today.  Cardiovascular risk and specific lipid/LDL goals reviewed.  We discussed several lifestyle modifications today and Samiah will continue to work on diet, exercise and weight loss efforts. Orders and follow up as documented in patient record.  She still declines medication.  Continue prudent nutritional plan, weight loss.  Recheck in 3 months.  Counseling Intensive lifestyle modifications are the first line treatment for this issue. . Dietary changes: Increase soluble fiber. Decrease simple carbohydrates. . Exercise changes: Moderate to vigorous-intensity aerobic activity 150 minutes per week if tolerated. . Lipid-lowering medications: see documented in medical record.  5. Class 2 severe obesity with serious comorbidity and body mass index (BMI) of 37.0 to 37.9 in adult, unspecified obesity type (HCC) Diania is currently in the action stage of change. As such, her goal is to continue with weight loss efforts. She has agreed to the Category 1 Plan.   Exercise goals: As is.  Behavioral modification strategies: increasing lean protein intake, increasing water intake, meal planning and cooking strategies and planning for success.  Deshundra has agreed to follow-up with our clinic in 2 weeks. She was informed of the importance of frequent follow-up visits to maximize her success with intensive lifestyle modifications for her multiple health conditions.   Objective:   Blood pressure 114/74, pulse 82, temperature 98.8 F (37.1 C), height 5\' 3"  (1.6 m), weight 212 lb (96.2 kg), SpO2 95 %. Body mass index is 37.55 kg/m.  General: Cooperative, alert, well developed, in no acute distress. HEENT: Conjunctivae and lids unremarkable. Cardiovascular: Regular rhythm.  Lungs: Normal work of  breathing. Neurologic: No focal deficits.   Lab Results  Component Value Date   CREATININE 0.84 12/07/2019   BUN 10 12/07/2019   NA 140 12/07/2019   K 4.7 12/07/2019   CL 102 12/07/2019   CO2 26 12/07/2019   Lab Results  Component Value Date   ALT 10 12/07/2019   AST 16 12/07/2019   ALKPHOS 74 12/07/2019   BILITOT 0.5 12/07/2019   Lab Results  Component Value Date   HGBA1C 5.9 (H) 12/07/2019   Lab Results  Component Value Date   INSULIN 11.3 12/07/2019   Lab Results  Component Value Date   TSH 2.850 12/07/2019   Lab Results  Component Value Date   CHOL 262 (H) 12/07/2019   HDL 56 12/07/2019   LDLCALC 192 (H) 12/07/2019   TRIG 82 12/07/2019   CHOLHDL 4.7 (H) 12/07/2019   Lab Results  Component Value Date   WBC 9.1 12/07/2019   HGB 15.0 12/07/2019   HCT 44.5 12/07/2019   MCV 87 12/07/2019   PLT 257 12/07/2019   Lab Results  Component Value Date   IRON 9 (L) 06/21/2019   TIBC 454 (H) 06/21/2019   FERRITIN 14 08/19/2019   Attestation Statements:   Reviewed by clinician on day of visit: allergies, medications, problem  list, medical history, surgical history, family history, social history, and previous encounter notes.  Time spent on visit including pre-visit chart review and post-visit care and charting was 50 minutes.   I, Water quality scientist, CMA, am acting as Location manager for [employer], DO.  I have reviewed the above documentation for accuracy and completeness, and I agree with the above. Mellody Dance, DO

## 2020-01-03 ENCOUNTER — Encounter (INDEPENDENT_AMBULATORY_CARE_PROVIDER_SITE_OTHER): Payer: Self-pay | Admitting: Family Medicine

## 2020-01-03 ENCOUNTER — Ambulatory Visit (INDEPENDENT_AMBULATORY_CARE_PROVIDER_SITE_OTHER): Payer: Medicare HMO | Admitting: Family Medicine

## 2020-01-03 ENCOUNTER — Other Ambulatory Visit: Payer: Self-pay

## 2020-01-03 VITALS — BP 110/78 | HR 74 | Temp 98.1°F | Ht 63.0 in | Wt 208.0 lb

## 2020-01-03 DIAGNOSIS — I10 Essential (primary) hypertension: Secondary | ICD-10-CM

## 2020-01-03 DIAGNOSIS — E559 Vitamin D deficiency, unspecified: Secondary | ICD-10-CM

## 2020-01-03 DIAGNOSIS — E66812 Obesity, class 2: Secondary | ICD-10-CM

## 2020-01-03 DIAGNOSIS — Z6837 Body mass index (BMI) 37.0-37.9, adult: Secondary | ICD-10-CM

## 2020-01-03 DIAGNOSIS — R7303 Prediabetes: Secondary | ICD-10-CM | POA: Diagnosis not present

## 2020-01-03 MED ORDER — VITAMIN D (ERGOCALCIFEROL) 1.25 MG (50000 UNIT) PO CAPS: 50000.0000 [IU] | ORAL_CAPSULE | ORAL | 0 refills | Status: DC

## 2020-01-03 NOTE — Progress Notes (Signed)
Chief Complaint:   OBESITY Lisa Owens is here to discuss her progress with her obesity treatment plan along with follow-up of her obesity related diagnoses. Natesha is on the Category 1 Plan and states she is following her eating plan approximately 100% of the time. Rachelann states she is exercising for 0 minutes 0 times per week.  Today's visit was #: 3 Starting weight: 218 lbs Starting date: 12/07/2019 Today's weight: 208 lbs Today's date: 01/10/2020 Total lbs lost to date: 10 lbs Total lbs lost since last in-office visit: 4 lbs  Interim History: Jennice says that her hunger and cravings are controlled.  She says she is less fatigued and has more energy than prior.  She feels more motivated and is doing more in general, she says.  Subjective:   1. Vitamin D deficiency Naylah's Vitamin D level was 19.0 on 12/07/2019. She is currently taking prescription vitamin D 50,000 IU each week. She denies nausea, vomiting or muscle weakness.  2. Pre-diabetes Sadhana has a diagnosis of prediabetes based on her elevated HgA1c and was informed this puts her at greater risk of developing diabetes. She continues to work on diet and exercise to decrease her risk of diabetes. She denies nausea or hypoglycemia.  Lab Results  Component Value Date   HGBA1C 5.9 (H) 12/07/2019   Lab Results  Component Value Date   INSULIN 11.3 12/07/2019   3. Essential hypertension Review: taking medications as instructed, no medication side effects noted, no chest pain on exertion, no dyspnea on exertion, no swelling of ankles.   BP Readings from Last 3 Encounters:  01/03/20 110/78  12/21/19 114/74  12/07/19 (!) 153/99   Assessment/Plan:   1. Vitamin D deficiency Low Vitamin D level contributes to fatigue and are associated with obesity, breast, and colon cancer. She agrees to continue to take prescription Vitamin D @50 ,000 IU every week.  Continue to monitor vitamin D level every 3-4 months.  -  Refill Vitamin D, Ergocalciferol, (DRISDOL) 1.25 MG (50000 UNIT) CAPS capsule; Take 1 capsule (50,000 Units total) by mouth every 7 (seven) days.  Dispense: 4 capsule; Refill: 0  2. Pre-diabetes Anahi will continue to work on weight loss, exercise, and decreasing simple carbohydrates to help decrease the risk of diabetes.   3. Essential hypertension Chanley is working on healthy weight loss and exercise to improve blood pressure control. We will watch for signs of hypotension as she continues her lifestyle modifications.  Discussed with her again the current guidelines by the AHA, ACC for blood pressure goals in someone at 79 years old.  4. Class 2 severe obesity with serious comorbidity and body mass index (BMI) of 37.0 to 37.9 in adult, unspecified obesity type (HCC) Kambree is currently in the action stage of change. As such, her goal is to continue with weight loss efforts. She has agreed to the Category 1 Plan.   Exercise goals: As is.  Behavioral modification strategies: increasing lean protein intake, decreasing simple carbohydrates, meal planning and cooking strategies, better snacking choices and planning for success.  Carl has agreed to follow-up with our clinic in 2 weeks. She was informed of the importance of frequent follow-up visits to maximize her success with intensive lifestyle modifications for her multiple health conditions.   Objective:   Blood pressure 110/78, pulse 74, temperature 98.1 F (36.7 C), height 5\' 3"  (1.6 m), weight 208 lb (94.3 kg), SpO2 95 %. Body mass index is 36.85 kg/m.  General: Cooperative, alert, well developed, in no  acute distress. HEENT: Conjunctivae and lids unremarkable. Cardiovascular: Regular rhythm.  Lungs: Normal work of breathing. Neurologic: No focal deficits.   Lab Results  Component Value Date   CREATININE 0.84 12/07/2019   BUN 10 12/07/2019   NA 140 12/07/2019   K 4.7 12/07/2019   CL 102 12/07/2019   CO2 26  12/07/2019   Lab Results  Component Value Date   ALT 10 12/07/2019   AST 16 12/07/2019   ALKPHOS 74 12/07/2019   BILITOT 0.5 12/07/2019   Lab Results  Component Value Date   HGBA1C 5.9 (H) 12/07/2019   Lab Results  Component Value Date   INSULIN 11.3 12/07/2019   Lab Results  Component Value Date   TSH 2.850 12/07/2019   Lab Results  Component Value Date   CHOL 262 (H) 12/07/2019   HDL 56 12/07/2019   LDLCALC 192 (H) 12/07/2019   TRIG 82 12/07/2019   CHOLHDL 4.7 (H) 12/07/2019   Lab Results  Component Value Date   WBC 9.1 12/07/2019   HGB 15.0 12/07/2019   HCT 44.5 12/07/2019   MCV 87 12/07/2019   PLT 257 12/07/2019   Lab Results  Component Value Date   IRON 9 (L) 06/21/2019   TIBC 454 (H) 06/21/2019   FERRITIN 14 08/19/2019   Obesity Behavioral Intervention Documentation for Insurance:   Approximately 15 minutes were spent on the discussion below.  ASK: We discussed the diagnosis of obesity with [name] today and Erian agreed to give [name] permission to discuss obesity behavioral modification therapy today.  ASSESS: Valicia has the diagnosis of obesity and her BMI today is 37.0. Demeshia is in the action stage of change.   ADVISE: Lataya was educated on the multiple health risks of obesity as well as the benefit of weight loss to improve her health. She was advised of the need for long term treatment and the importance of lifestyle modifications to improve her current health and to decrease her risk of future health problems.  AGREE: Multiple dietary modification options and treatment options were discussed and Maryem agreed to follow the recommendations documented in the above note.  ARRANGE: Almedia was educated on the importance of frequent visits to treat obesity as outlined per CMS and USPSTF guidelines and agreed to schedule her next follow up appointment today.  Attestation Statements:   Reviewed by clinician on day of visit:  allergies, medications, problem list, medical history, surgical history, family history, social history, and previous encounter notes.  I, Water quality scientist, CMA, am acting as Location manager for [employer], DO.  I have reviewed the above documentation for accuracy and completeness, and I agree with the above. -  Mellody Dance, DO

## 2020-01-19 ENCOUNTER — Encounter (INDEPENDENT_AMBULATORY_CARE_PROVIDER_SITE_OTHER): Payer: Self-pay | Admitting: Family Medicine

## 2020-01-19 ENCOUNTER — Ambulatory Visit (INDEPENDENT_AMBULATORY_CARE_PROVIDER_SITE_OTHER): Payer: Medicare HMO | Admitting: Family Medicine

## 2020-01-19 ENCOUNTER — Other Ambulatory Visit: Payer: Self-pay

## 2020-01-19 VITALS — BP 135/64 | HR 74 | Temp 98.4°F | Ht 63.0 in | Wt 204.0 lb

## 2020-01-19 DIAGNOSIS — E559 Vitamin D deficiency, unspecified: Secondary | ICD-10-CM | POA: Diagnosis not present

## 2020-01-19 DIAGNOSIS — I10 Essential (primary) hypertension: Secondary | ICD-10-CM | POA: Diagnosis not present

## 2020-01-19 DIAGNOSIS — Z6836 Body mass index (BMI) 36.0-36.9, adult: Secondary | ICD-10-CM

## 2020-01-19 DIAGNOSIS — R7303 Prediabetes: Secondary | ICD-10-CM | POA: Diagnosis not present

## 2020-01-20 NOTE — Progress Notes (Signed)
Chief Complaint:   OBESITY Neetu is here to discuss her progress with her obesity treatment plan along with follow-up of her obesity related diagnoses. Imari is on the Category 1 Plan and states she is following her eating plan approximately 100% of the time. Alisyn states she is exercising for 0 minutes 0 times per week.  Today's visit was #: 4 Starting weight: 218 lbs Starting date: 12/07/2019 Today's weight: 204 lbs Today's date: 01/19/2020 Total lbs lost to date: 14 lbs Total lbs lost since last in-office visit: 4 lbs  Interim History: Marlaine says the plan is going well.  Denies problems/concerns with hunger or cravings.  She is determined.  For snacks, she has an apple or Babybel cheese.  Subjective:   1. Essential hypertension Review: taking medications as instructed, no medication side effects noted, no chest pain on exertion, no dyspnea on exertion, no swelling of ankles.  She takes Norvasc daily.  Denies hypotension symptoms or new concerns.  BP Readings from Last 3 Encounters:  01/19/20 135/64  01/03/20 110/78  12/21/19 114/74   2. Vitamin D deficiency Chalice's Vitamin D level was 19.0 on 12/07/2019. She is currently taking prescription vitamin D 50,000 IU each week. She denies nausea, vomiting or muscle weakness.  She says she has increased energy levels and can do more things around the house without feeling so tired after.  3. Pre-diabetes Quanita has a diagnosis of prediabetes based on her elevated HgA1c and was informed this puts her at greater risk of developing diabetes. She continues to work on diet and exercise to decrease her risk of diabetes. She denies nausea or hypoglycemia.  Lab Results  Component Value Date   HGBA1C 5.9 (H) 12/07/2019   Lab Results  Component Value Date   INSULIN 11.3 12/07/2019   Assessment/Plan:   1. Essential hypertension Faten is working on healthy weight loss and exercise to improve blood pressure  control. We will watch for signs of hypotension as she continues her lifestyle modifications.  She is aware that with weight loss, her blood pressure can drop.  She will do home blood pressure monitoring and keep a log.  She will contact Cardiology/us if she develops new symptoms.  Continue close monitoring every 2 weeks.  2. Vitamin D deficiency Discussed labs with patient today.  Low Vitamin D level contributes to fatigue and are associated with obesity, breast, and colon cancer. She agrees to continue to take prescription Vitamin D @50 ,000 IU every week and will follow-up for routine testing of Vitamin D, at least 2-3 times per year to avoid over-replacement.  She is tolerating this well and it is helping her.  Recheck vitamin D around 03/08/2020.  3. Pre-diabetes Discussed labs with patient today.  Aishani will continue to work on weight loss, exercise, and decreasing simple carbohydrates to help decrease the risk of diabetes.  Reiterated to her the importance of avoiding simple carbs as to not stimulate cravings or hunger secondary to insulin response.  She will continue to make healthier "snack" choices and we will recheck A1c every 3 months around the end of October 2021.  4. Class 2 severe obesity with serious comorbidity and body mass index (BMI) of 36.0 to 36.9 in adult, unspecified obesity type (HCC) Harlowe is currently in the action stage of change. As such, her goal is to continue with weight loss efforts. She has agreed to the Category 1 Plan.   Exercise goals: All adults should avoid inactivity. Some physical activity is  better than none, and adults who participate in any amount of physical activity gain some health benefits.  Behavioral modification strategies: meal planning and cooking strategies and planning for success.  Chellsie has agreed to follow-up with our clinic in 2 weeks. She was informed of the importance of frequent follow-up visits to maximize her success with  intensive lifestyle modifications for her multiple health conditions.   Objective:   Blood pressure 135/64, pulse 74, temperature 98.4 F (36.9 C), height 5\' 3"  (1.6 m), weight 204 lb (92.5 kg), SpO2 96 %. Body mass index is 36.14 kg/m.  General: Cooperative, alert, well developed, in no acute distress. HEENT: Conjunctivae and lids unremarkable. Cardiovascular: Regular rhythm.  Lungs: Normal work of breathing. Neurologic: No focal deficits.   Lab Results  Component Value Date   CREATININE 0.84 12/07/2019   BUN 10 12/07/2019   NA 140 12/07/2019   K 4.7 12/07/2019   CL 102 12/07/2019   CO2 26 12/07/2019   Lab Results  Component Value Date   ALT 10 12/07/2019   AST 16 12/07/2019   ALKPHOS 74 12/07/2019   BILITOT 0.5 12/07/2019   Lab Results  Component Value Date   HGBA1C 5.9 (H) 12/07/2019   Lab Results  Component Value Date   INSULIN 11.3 12/07/2019   Lab Results  Component Value Date   TSH 2.850 12/07/2019   Lab Results  Component Value Date   CHOL 262 (H) 12/07/2019   HDL 56 12/07/2019   LDLCALC 192 (H) 12/07/2019   TRIG 82 12/07/2019   CHOLHDL 4.7 (H) 12/07/2019   Lab Results  Component Value Date   WBC 9.1 12/07/2019   HGB 15.0 12/07/2019   HCT 44.5 12/07/2019   MCV 87 12/07/2019   PLT 257 12/07/2019   Lab Results  Component Value Date   IRON 9 (L) 06/21/2019   TIBC 454 (H) 06/21/2019   FERRITIN 14 08/19/2019   Obesity Behavioral Intervention:   Approximately 15 minutes were spent on the discussion below.  ASK: We discussed the diagnosis of obesity with [name] today and Ednah agreed to give [name] permission to discuss obesity behavioral modification therapy today.  ASSESS: Ilse has the diagnosis of obesity and her BMI today is 36.2. Novi is in the action stage of change.   ADVISE: Mame was educated on the multiple health risks of obesity as well as the benefit of weight loss to improve her health. She was advised of the  need for long term treatment and the importance of lifestyle modifications to improve her current health and to decrease her risk of future health problems.  AGREE: Multiple dietary modification options and treatment options were discussed and Carys agreed to follow the recommendations documented in the above note.  ARRANGE: Neda was educated on the importance of frequent visits to treat obesity as outlined per CMS and USPSTF guidelines and agreed to schedule her next follow up appointment today.  Attestation Statements:   Reviewed by clinician on day of visit: allergies, medications, problem list, medical history, surgical history, family history, social history, and previous encounter notes.  I, Water quality scientist, CMA, am acting as Location manager for [employer], DO.  I have reviewed the above documentation for accuracy and completeness, and I agree with the above. Mellody Dance, DO

## 2020-02-02 ENCOUNTER — Ambulatory Visit (INDEPENDENT_AMBULATORY_CARE_PROVIDER_SITE_OTHER): Payer: Medicare HMO | Admitting: Family Medicine

## 2020-02-02 ENCOUNTER — Other Ambulatory Visit: Payer: Self-pay

## 2020-02-02 ENCOUNTER — Encounter (INDEPENDENT_AMBULATORY_CARE_PROVIDER_SITE_OTHER): Payer: Self-pay | Admitting: Family Medicine

## 2020-02-02 VITALS — BP 118/78 | HR 70 | Temp 98.3°F | Ht 63.0 in | Wt 200.0 lb

## 2020-02-02 DIAGNOSIS — I10 Essential (primary) hypertension: Secondary | ICD-10-CM

## 2020-02-02 DIAGNOSIS — K5909 Other constipation: Secondary | ICD-10-CM

## 2020-02-02 DIAGNOSIS — Z6835 Body mass index (BMI) 35.0-35.9, adult: Secondary | ICD-10-CM | POA: Diagnosis not present

## 2020-02-02 DIAGNOSIS — E559 Vitamin D deficiency, unspecified: Secondary | ICD-10-CM

## 2020-02-02 MED ORDER — VITAMIN D (ERGOCALCIFEROL) 1.25 MG (50000 UNIT) PO CAPS: 50000.0000 [IU] | ORAL_CAPSULE | ORAL | 0 refills | Status: DC

## 2020-02-02 MED ORDER — POLYETHYLENE GLYCOL 3350 17 G PO PACK: 17.0000 g | PACK | Freq: Two times a day (BID) | ORAL | 0 refills | Status: AC

## 2020-02-06 NOTE — Progress Notes (Signed)
Chief Complaint:   OBESITY Lisa Owens is here to discuss her progress with her obesity treatment plan along with follow-up of her obesity related diagnoses. Lisa Owens is on the Category 1 Plan and states she is following her eating plan approximately 100% of the time. Lisa Owens states she is exercising for 0 minutes 0 times per week.  Today's visit was #: 5 Starting weight: 218 lbs Starting date: 12/07/2019 Today's weight: 200 lbs Today's date: 02/02/2020 Total lbs lost to date: 18 lbs Total lbs lost since last in-office visit: 4 lbs   Interim History:  Lisa Owens says she is having a problem with constipation.  She tried Colace and magnesium citrate.    She says her hunger and cravings are well-controlled, with no issues.    She says that her energy levels have improved drastically.    Her back pain is no worse than usual.  It limits her activity significantly.  She has orthotics/ wears good shoes and sees Dr. Nelva Bush and others for back pain/treatment.   Subjective:   1. Other constipation Lisa Owens endorses increased symptoms over the last couple of weeks.  She has tried several OTC medications that led to a bathroom "blow-out" that made a mess.  She is wondering what she can do.  2. Vitamin D deficiency Lisa Owens's Vitamin D level was 19.0 on 12/07/2019. She is currently taking prescription vitamin D 50,000 IU each week. She denies nausea, vomiting or muscle weakness.  She has no issues with the medication.  Myalgias and fatigue are improving.  3. Essential hypertension Review: taking medications as instructed, no medication side effects noted, no chest pain on exertion, no dyspnea on exertion, no swelling of ankles.  Occasionally checks blood pressure at home.  It is usually around the 130s/80s.  BP Readings from Last 3 Encounters:  02/02/20 118/78  01/19/20 135/64  01/03/20 110/78   Assessment/Plan:   1. Other constipation Anntionette was informed that a decrease in  bowel movement frequency is normal while losing weight, but stools should not be hard or painful. Orders and follow up as documented in patient record.   -Start MiraLAX 17 gr twice daily, #60, 0 refills.  Counseling Getting to Good Bowel Health: Your goal is to have one soft bowel movement each day. Drink at least 8 glasses of water each day. Eat plenty of fiber (goal is over 25 grams each day). It is best to get most of your fiber from dietary sources which includes leafy green vegetables, fresh fruit, and whole grains. You may need to add fiber with the help of OTC fiber supplements. These include Metamucil, Citrucel, and Flaxseed. If you are still having trouble, try adding Miralax or Magnesium Citrate. If all of these changes do not work, Cabin crew.  2. Vitamin D deficiency Low Vitamin D level contributes to fatigue and are associated with obesity, breast, and colon cancer. She agrees to continue to take prescription Vitamin D @50 ,000 IU every week and will follow-up for routine testing of Vitamin D, at least 2-3 times per year to avoid over-replacement.  Recheck level after taking vitamin D for 2-3 months (around late October).  -Refill Vitamin D, Ergocalciferol, (DRISDOL) 1.25 MG (50000 UNIT) CAPS capsule; Take 1 capsule (50,000 Units total) by mouth every 7 (seven) days.  Dispense: 4 capsule; Refill: 0  3. Essential hypertension Lisa Owens is working on healthy weight loss and exercise to improve blood pressure control. We will watch for signs of hypotension as she continues her lifestyle  modifications.  Blood pressure is at goal.  She is asymptomatic.  Continue current Norvasc dose.  4. Class 2 severe obesity with serious comorbidity and body mass index (BMI) of 35.0 to 35.9 in adult, unspecified obesity type (HCC) Lisa Owens is currently in the action stage of change. As such, her goal is to continue with weight loss efforts. She has agreed to the Category 1 Plan.   Exercise  goals: As is.  Try to walk some as tolerated. Explained it will help with her BM's.  Behavioral modification strategies: increasing lean protein intake, increasing water intake and planning for success.  Lisa Owens has agreed to follow-up with our clinic in 2 weeks. She was informed of the importance of frequent follow-up visits to maximize her success with intensive lifestyle modifications for her multiple health conditions.   Objective:   Blood pressure 118/78, pulse 70, temperature 98.3 F (36.8 C), height 5\' 3"  (1.6 m), weight 200 lb (90.7 kg), SpO2 97 %. Body mass index is 35.43 kg/m.  General: Cooperative, alert, well developed, in no acute distress. HEENT: Conjunctivae and lids unremarkable. Cardiovascular: Regular rhythm.  Lungs: Normal work of breathing. Neurologic: No focal deficits.   Lab Results  Component Value Date   CREATININE 0.84 12/07/2019   BUN 10 12/07/2019   NA 140 12/07/2019   K 4.7 12/07/2019   CL 102 12/07/2019   CO2 26 12/07/2019   Lab Results  Component Value Date   ALT 10 12/07/2019   AST 16 12/07/2019   ALKPHOS 74 12/07/2019   BILITOT 0.5 12/07/2019   Lab Results  Component Value Date   HGBA1C 5.9 (H) 12/07/2019   Lab Results  Component Value Date   INSULIN 11.3 12/07/2019   Lab Results  Component Value Date   TSH 2.850 12/07/2019   Lab Results  Component Value Date   CHOL 262 (H) 12/07/2019   HDL 56 12/07/2019   LDLCALC 192 (H) 12/07/2019   TRIG 82 12/07/2019   CHOLHDL 4.7 (H) 12/07/2019   Lab Results  Component Value Date   WBC 9.1 12/07/2019   HGB 15.0 12/07/2019   HCT 44.5 12/07/2019   MCV 87 12/07/2019   PLT 257 12/07/2019   Lab Results  Component Value Date   IRON 9 (L) 06/21/2019   TIBC 454 (H) 06/21/2019   FERRITIN 14 08/19/2019   Obesity Behavioral Intervention:   Approximately 15 minutes were spent on the discussion below.  ASK: We discussed the diagnosis of obesity with Lisa Owens today and Lisa Owens agreed  to give [name] permission to discuss obesity behavioral modification therapy today.  ASSESS: Lisa Owens has the diagnosis of obesity and her BMI today is 35.5. Lisa Owens is in the action stage of change.   ADVISE: Lisa Owens was educated on the multiple health risks of obesity as well as the benefit of weight loss to improve her health. She was advised of the need for long term treatment and the importance of lifestyle modifications to improve her current health and to decrease her risk of future health problems.  AGREE: Multiple dietary modification options and treatment options were discussed and Lisa Owens agreed to follow the recommendations documented in the above note.  ARRANGE: Lisa Owens was educated on the importance of frequent visits to treat obesity as outlined per CMS and USPSTF guidelines and agreed to schedule her next follow up appointment today.  Attestation Statements:   Reviewed by clinician on day of visit: allergies, medications, problem list, medical history, surgical history, family history, social history, and previous  encounter notes.  I, Water quality scientist, CMA, am acting as Location manager for [employer], DO.  I have reviewed the above documentation for accuracy and completeness, and I agree with the above. Lisa Dance, DO

## 2020-02-07 ENCOUNTER — Other Ambulatory Visit (INDEPENDENT_AMBULATORY_CARE_PROVIDER_SITE_OTHER): Payer: Self-pay | Admitting: Family Medicine

## 2020-02-07 DIAGNOSIS — E559 Vitamin D deficiency, unspecified: Secondary | ICD-10-CM

## 2020-02-16 DIAGNOSIS — Z0000 Encounter for general adult medical examination without abnormal findings: Secondary | ICD-10-CM | POA: Diagnosis not present

## 2020-02-16 DIAGNOSIS — I10 Essential (primary) hypertension: Secondary | ICD-10-CM | POA: Diagnosis not present

## 2020-02-16 DIAGNOSIS — I700 Atherosclerosis of aorta: Secondary | ICD-10-CM | POA: Diagnosis not present

## 2020-02-16 DIAGNOSIS — Z86718 Personal history of other venous thrombosis and embolism: Secondary | ICD-10-CM | POA: Diagnosis not present

## 2020-02-16 DIAGNOSIS — E559 Vitamin D deficiency, unspecified: Secondary | ICD-10-CM | POA: Diagnosis not present

## 2020-02-16 DIAGNOSIS — E782 Mixed hyperlipidemia: Secondary | ICD-10-CM | POA: Diagnosis not present

## 2020-02-16 DIAGNOSIS — D6869 Other thrombophilia: Secondary | ICD-10-CM | POA: Diagnosis not present

## 2020-02-16 DIAGNOSIS — D509 Iron deficiency anemia, unspecified: Secondary | ICD-10-CM | POA: Diagnosis not present

## 2020-02-16 DIAGNOSIS — Z7901 Long term (current) use of anticoagulants: Secondary | ICD-10-CM | POA: Diagnosis not present

## 2020-02-16 LAB — CBC AND DIFFERENTIAL
HCT: 42 (ref 36–46)
Hemoglobin: 14.4 (ref 12.0–16.0)
Platelets: 231 (ref 150–399)
WBC: 7.8

## 2020-02-16 LAB — BASIC METABOLIC PANEL
BUN: 20 (ref 4–21)
CO2: 29 — AB (ref 13–22)
Chloride: 104 (ref 99–108)
Creatinine: 0.7 (ref 0.5–1.1)
Glucose: 83
Potassium: 4.2 (ref 3.4–5.3)
Sodium: 142 (ref 137–147)

## 2020-02-16 LAB — HEPATIC FUNCTION PANEL
ALT: 19 (ref 7–35)
AST: 21 (ref 13–35)
Alkaline Phosphatase: 53 (ref 25–125)
Bilirubin, Total: 0.7

## 2020-02-16 LAB — LIPID PANEL
Cholesterol: 241 — AB (ref 0–200)
HDL: 45 (ref 35–70)
LDL Cholesterol: 181
LDl/HDL Ratio: 5.3
Triglycerides: 84 (ref 40–160)

## 2020-02-16 LAB — COMPREHENSIVE METABOLIC PANEL
Albumin: 3.7 (ref 3.5–5.0)
Calcium: 9.5 (ref 8.7–10.7)
GFR calc Af Amer: 104
GFR calc non Af Amer: 86

## 2020-02-22 ENCOUNTER — Ambulatory Visit (INDEPENDENT_AMBULATORY_CARE_PROVIDER_SITE_OTHER): Payer: Medicare HMO | Admitting: Family Medicine

## 2020-02-22 ENCOUNTER — Encounter (INDEPENDENT_AMBULATORY_CARE_PROVIDER_SITE_OTHER): Payer: Self-pay | Admitting: Family Medicine

## 2020-02-22 ENCOUNTER — Telehealth (INDEPENDENT_AMBULATORY_CARE_PROVIDER_SITE_OTHER): Payer: Self-pay

## 2020-02-22 ENCOUNTER — Other Ambulatory Visit: Payer: Self-pay

## 2020-02-22 VITALS — BP 137/75 | HR 77 | Temp 98.3°F | Ht 63.0 in | Wt 196.0 lb

## 2020-02-22 DIAGNOSIS — R7303 Prediabetes: Secondary | ICD-10-CM | POA: Diagnosis not present

## 2020-02-22 DIAGNOSIS — E669 Obesity, unspecified: Secondary | ICD-10-CM | POA: Diagnosis not present

## 2020-02-22 DIAGNOSIS — E7849 Other hyperlipidemia: Secondary | ICD-10-CM | POA: Diagnosis not present

## 2020-02-22 DIAGNOSIS — E559 Vitamin D deficiency, unspecified: Secondary | ICD-10-CM

## 2020-02-22 DIAGNOSIS — K5909 Other constipation: Secondary | ICD-10-CM

## 2020-02-22 DIAGNOSIS — Z6834 Body mass index (BMI) 34.0-34.9, adult: Secondary | ICD-10-CM | POA: Diagnosis not present

## 2020-02-22 MED ORDER — VITAMIN D (ERGOCALCIFEROL) 1.25 MG (50000 UNIT) PO CAPS: 50000.0000 [IU] | ORAL_CAPSULE | ORAL | 0 refills | Status: DC

## 2020-02-22 NOTE — Telephone Encounter (Signed)
Called and spoke to [place] at Dr Alan Ripper office.  Requested labs done in the last 3 months, Leafy Ro states that she will send request to Medical Records.

## 2020-02-23 NOTE — Progress Notes (Signed)
Chief Complaint:   OBESITY Lisa Owens is here to discuss her progress with her obesity treatment plan along with follow-up of her obesity related diagnoses. Lisa Owens is on the Category 1 Plan and states she is following her eating plan approximately 99% of the time. Lisa Owens states she is doing squats, stretching, and doing leg exercises for 10 minutes 5-7 times per week.  Today's visit was #: 6 Starting weight: 218 lbs Starting date: 12/07/2019 Today's weight: 196 lbs Today's date: 02/22/2020 Total lbs lost to date: 22 lbs Total lbs lost since last in-office visit: 4 lbs  Interim History: Lisa Owens says her hunger and cravings are controlled.  She did a "big cheat day" and ate pizza one day at home, but other than that one meal, she ate on plan and did get in all foods.   Assessment/Plan:    Meds ordered this encounter  Medications  . Vitamin D, Ergocalciferol, (DRISDOL) 1.25 MG (50000 UNIT) CAPS capsule    Sig: Take 1 capsule (50,000 Units total) by mouth every 7 (seven) days.    Dispense:  4 capsule    Refill:  0    1. Other constipation She started MiraLAX after last office visit.  She is using it as needed.  She says she has needed it every 3 days.  She is drinking 2 bottles of water per day (33.2 ounce bottles).    2. Prediabetes Lisa Owens has a diagnosis of prediabetes based on her elevated HgA1c and was informed this puts her at greater risk of developing diabetes. She continues to work on diet and exercise to decrease her risk of diabetes. She denies nausea or hypoglycemia. She is on no medication.  Plan:  Check A1c, FI at the end of October/early November. - I reiterated and again counseled patient on pathophysiology of the disease process of Pre-DM.  -   Stressed importance of dietary and lifestyle modifications resulting in weight loss as first line, in addition to discussing the risks and benefits of various medication options which can help [name] in the  management of this disease process.   - Pt agreed to treatment plan today which was conceived using shared medical decision making.     - Handouts provided at pt's request after education provided and all concerns/questions addressed.    - Anticipatory guidance given.   - Recheck A1c and fasting insulin level in approximately 3 months from last check or as deemed fit.    Lab Results  Component Value Date   HGBA1C 5.9 (H) 12/07/2019   Lab Results  Component Value Date   INSULIN 11.3 12/07/2019     3. Vitamin D deficiency Lisa Owens has a history of Vitamin D deficiency with resultant generalized fatigue as her primary symptom.  she is taking vitamin D 50,000 IU weekly for this deficiency and tolerating it well without side-effect.   Most recent Vitamin D lab reviewed-  level: 19.0.  Plan:   - Discussed importance of vitamin D (as well as calcium) to their health and well-being.   - We reviewed possible symptoms of low Vitamin D including low energy, depressed mood, muscle aches, joint aches, osteoporosis etc.  - We discussed that low Vitamin D levels may be linked to an increased risk of cardiovascular events and even increased risk of cancers- such as colon and breast.   - Educated pt that weight loss will likely improve availability of vitamin D, thus encouraged Lisa Owens to continue with meal plan and their weight  loss efforts to further improve this condition  - I recommend pt take a weekly prescription vit D- see script below- which pt agrees to after discussion of risks and benefits of this medication.      - Informed patient this may be a lifelong thing, and she was encouraged to continue to take the medicine until pt told otherwise.   We will need to monitor levels regularly ( q 3-4 mo on average )  to keep levels within normal limits.   - All pt's questions and concerns regarding this condition addressed  -Refill Vitamin D, Ergocalciferol, (DRISDOL) 1.25 MG  (50000 UNIT) CAPS capsule; Take 1 capsule (50,000 Units total) by mouth every 7 (seven) days.  Dispense: 4 capsule; Refill: 0    4. Other hyperlipidemia Labs were recently done at her PCPs office.  Plan:  Obtain labs from PCP that were recently done ([facility]).  I asked my CMA to request them.   5. Class 1 obesity with serious comorbidity and body mass index (BMI) of 34.0 to 34.9 in adult, unspecified obesity type  Lisa Owens is currently in the action stage of change. As such, her goal is to continue with weight loss efforts. She has agreed to the Category 1 Plan.   Exercise goals: As is.  Behavioral modification strategies: increasing lean protein intake, decreasing simple carbohydrates, increasing water intake, increasing high fiber foods, meal planning and cooking strategies, dealing with family sabotage and celebration eating/eating out strategies.  Lisa Owens has agreed to follow-up with our clinic in 2-3 weeks. She was informed of the importance of frequent follow-up visits to maximize her success with intensive lifestyle modifications for her multiple health conditions.   Objective:   Blood pressure 137/75, pulse 77, temperature 98.3 F (36.8 C), height 5\' 3"  (1.6 m), weight 196 lb (88.9 kg), SpO2 98 %. Body mass index is 34.72 kg/m.  General: Cooperative, alert, well developed, in no acute distress. HEENT: Conjunctivae and lids unremarkable. Cardiovascular: Regular rhythm.  Lungs: Normal work of breathing. Neurologic: No focal deficits.   Lab Results  Component Value Date   CREATININE 0.84 12/07/2019   BUN 10 12/07/2019   NA 140 12/07/2019   K 4.7 12/07/2019   CL 102 12/07/2019   CO2 26 12/07/2019   Lab Results  Component Value Date   ALT 10 12/07/2019   AST 16 12/07/2019   ALKPHOS 74 12/07/2019   BILITOT 0.5 12/07/2019   Lab Results  Component Value Date   HGBA1C 5.9 (H) 12/07/2019   Lab Results  Component Value Date   INSULIN 11.3 12/07/2019   Lab Results   Component Value Date   TSH 2.850 12/07/2019   Lab Results  Component Value Date   CHOL 262 (H) 12/07/2019   HDL 56 12/07/2019   LDLCALC 192 (H) 12/07/2019   TRIG 82 12/07/2019   CHOLHDL 4.7 (H) 12/07/2019   Lab Results  Component Value Date   WBC 9.1 12/07/2019   HGB 15.0 12/07/2019   HCT 44.5 12/07/2019   MCV 87 12/07/2019   PLT 257 12/07/2019   Lab Results  Component Value Date   IRON 9 (L) 06/21/2019   TIBC 454 (H) 06/21/2019   FERRITIN 14 08/19/2019   Obesity Behavioral Intervention:   Approximately 15 minutes were spent on the discussion below.  ASK: We discussed the diagnosis of obesity with Lisa Owens today and Lisa Owens agreed to give [name] permission to discuss obesity behavioral modification therapy today.  ASSESS: Lisa Owens has the diagnosis of obesity and  her BMI today is 34.8. Lisa Owens is in the action stage of change.   ADVISE: Lisa Owens was educated on the multiple health risks of obesity as well as the benefit of weight loss to improve her health. She was advised of the need for long term treatment and the importance of lifestyle modifications to improve her current health and to decrease her risk of future health problems.  AGREE: Multiple dietary modification options and treatment options were discussed and Lisa Owens agreed to follow the recommendations documented in the above note.  ARRANGE: Lisa Owens was educated on the importance of frequent visits to treat obesity as outlined per CMS and USPSTF guidelines and agreed to schedule her next follow up appointment today.  Attestation Statements:   Reviewed by clinician on day of visit: allergies, medications, problem list, medical history, surgical history, family history, social history, and previous encounter notes.  I, Water quality scientist, CMA, am acting as Location manager for [employer], DO.  I have reviewed the above documentation for accuracy and completeness, and I agree with the above. -   *[name], D.O.  The [place] was signed into law in 2016 which includes the topic of electronic health records.  This provides immediate access to information in MyChart.  This includes consultation notes, operative notes, office notes, lab results and pathology reports.  If you have any questions about what you read please let us know at your next visit so we can discuss your concerns and take corrective action if need be.  We are right here with you.

## 2020-03-02 ENCOUNTER — Other Ambulatory Visit (INDEPENDENT_AMBULATORY_CARE_PROVIDER_SITE_OTHER): Payer: Self-pay

## 2020-03-14 ENCOUNTER — Ambulatory Visit (INDEPENDENT_AMBULATORY_CARE_PROVIDER_SITE_OTHER): Payer: Medicare HMO | Admitting: Family Medicine

## 2020-03-14 ENCOUNTER — Encounter (INDEPENDENT_AMBULATORY_CARE_PROVIDER_SITE_OTHER): Payer: Self-pay | Admitting: Family Medicine

## 2020-03-14 ENCOUNTER — Other Ambulatory Visit: Payer: Self-pay

## 2020-03-14 VITALS — BP 129/76 | HR 77 | Temp 98.0°F | Ht 63.0 in | Wt 193.0 lb

## 2020-03-14 DIAGNOSIS — E559 Vitamin D deficiency, unspecified: Secondary | ICD-10-CM | POA: Diagnosis not present

## 2020-03-14 DIAGNOSIS — E7849 Other hyperlipidemia: Secondary | ICD-10-CM | POA: Diagnosis not present

## 2020-03-14 DIAGNOSIS — R7303 Prediabetes: Secondary | ICD-10-CM

## 2020-03-14 DIAGNOSIS — Z6834 Body mass index (BMI) 34.0-34.9, adult: Secondary | ICD-10-CM

## 2020-03-14 DIAGNOSIS — E669 Obesity, unspecified: Secondary | ICD-10-CM

## 2020-03-14 DIAGNOSIS — I10 Essential (primary) hypertension: Secondary | ICD-10-CM | POA: Diagnosis not present

## 2020-03-15 NOTE — Progress Notes (Signed)
Chief Complaint:   OBESITY Lisa Owens is here to discuss her progress with her obesity treatment plan along with follow-up of her obesity related diagnoses. Lisa Owens is on the Category 1 Plan and states she is following her eating plan approximately 95% of the time. Lisa Owens states she is walking and stretching for 10 minutes 7 times per week.  Today's visit was #: 7 Starting weight: 218 lbs Starting date: 12/07/2019 Today's weight: 193 lbs Today's date: 03/14/2020 Total lbs lost to date: 25 lbs Total lbs lost since last in-office visit: 3 lbs Total weight loss percentage to date: -11.47%  Interim History: Lisa Owens says she is disappointed because she only lost 3 pounds instead of 4 pounds.  No issues with plan, but getting a little bored with breakfast.  Hunger and cravings are controlled.  Assessment/Plan:   1. Essential hypertension Discussed labs with patient today.  She is Norvasc daily.  Denies lightheadedness or dizziness.  She is not checking her blood pressure at home.  Plan:  Home blood pressure monitoring once or twice weekly and keep log.  Increase frequency if symptoms develop.  BP Readings from Last 3 Encounters:  03/14/20 129/76  02/22/20 137/75  02/02/20 118/78   2. Prediabetes Lisa Owens has a diagnosis of prediabetes based on her elevated HgA1c and was informed this puts her at greater risk of developing diabetes. She continues to work on diet and exercise to decrease her risk of diabetes. She denies nausea or hypoglycemia.  She is not on any medication.  Plan:  Check A1c, fasting insulin at next office visit.  Continue prudent nutritional plan and weight loss.  Lab Results  Component Value Date   HGBA1C 5.9 (H) 12/07/2019   Lab Results  Component Value Date   INSULIN 11.3 12/07/2019   3. Vitamin D deficiency Lisa Owens vitamin D level was 19.0 on 12/07/2019.  She is taking prescription vitamin D 50,000 IU weekly.  Plan:  Recheck vitamin D level at  next visit.  She does not need a refill today.  4. Other hyperlipidemia Discussed labs with patient today.  Labs were done at her PCP's office 3 weeks ago.  LDL was 181.  Plan:  She refuses statin, although I advised her to take it.  She wants to wait another 3-4 months and recheck.  Continue prudent nutritional plan, decrease saturated fats and increase movement as tolerated.   Lab Results  Component Value Date   ALT 19 02/16/2020   AST 21 02/16/2020   ALKPHOS 53 02/16/2020   BILITOT 0.5 12/07/2019   Lab Results  Component Value Date   CHOL 241 (A) 02/16/2020   HDL 45 02/16/2020   LDLCALC 181 02/16/2020   TRIG 84 02/16/2020   CHOLHDL 4.7 (H) 12/07/2019   5. Class 1 obesity with serious comorbidity and body mass index (BMI) of 34.0 to 34.9 in adult, unspecified obesity type  Lisa Owens is currently in the action stage of change. As such, her goal is to continue with weight loss efforts. She has agreed to the Category 1 Plan.  I gave her breakfast options/discussed different ways to make eggs, etc.   Exercise goals: Older adults should follow the adult guidelines. When older adults cannot meet the adult guidelines, they should be as physically active as their abilities and conditions will allow.  Older adults should do exercises that maintain or improve balance if they are at risk of falling.  Walking for 10 minutes per day, etc.  Behavioral modification strategies: meal  planning and cooking strategies, better snacking choices and planning for success.  Obesity Behavioral Intervention:  Approximately 12 minutes were spent on the discussion below.   ASK: We discussed the diagnosis of obesity with Lisa Owens today and Lisa Owens agreed to give [name] permission to discuss obesity behavioral modification therapy today.   ASSESS: Lisa Owens has the diagnosis of obesity and her BMI today is 28.4. Lisa Owens is in the action stage of change.    ADVISE: Lisa Owens was educated on the multiple health risks of obesity as  well as the benefit of weight loss to improve her health. She was advised of the need for long term treatment and the importance of lifestyle modifications to improve her current health and to decrease her risk of future health problems.   AGREE: Multiple dietary modification options and treatment options were discussed and Lisa Owens agreed to follow the recommendations documented in the above note.   ARRANGE: Lisa Owens was educated on the importance of frequent visits to treat obesity as outlined per CMS and USPSTF guidelines and agreed to schedule her next follow up appointment today.   Lisa Owens has agreed to follow-up with our clinic in 2 weeks and will check A1c, insulin and vitamin D if fasting. She was informed of the importance of frequent follow-up visits to maximize her success with intensive lifestyle modifications for her multiple health conditions.   Lisa Owens was informed we would discuss her lab results at her next visit unless there is a critical issue that needs to be addressed sooner. Lisa Owens agreed to keep her next visit at the agreed upon time to discuss these results.  Objective:   Blood pressure 129/76, pulse 77, temperature 98 F (36.7 C), height 5\' 3"  (1.6 m), weight 193 lb (87.5 kg), SpO2 96 %. Body mass index is 34.19 kg/m.  General: Cooperative, alert, well developed, in no acute distress. HEENT: Conjunctivae and lids unremarkable. Cardiovascular: Regular rhythm.  Lungs: Normal work of breathing. Neurologic: No focal deficits.   Lab Results  Component Value Date   CREATININE 0.7 02/16/2020   BUN 20 02/16/2020   NA 142 02/16/2020   K 4.2 02/16/2020   CL 104 02/16/2020   CO2 29 (A) 02/16/2020   Lab Results  Component Value Date   ALT 19 02/16/2020   AST 21 02/16/2020   ALKPHOS 53 02/16/2020   BILITOT 0.5 12/07/2019   Lab Results  Component Value Date   HGBA1C 5.9 (H) 12/07/2019   Lab Results  Component Value Date   INSULIN 11.3 12/07/2019   Lab  Results  Component Value Date   TSH 2.850 12/07/2019   Lab Results  Component Value Date   CHOL 241 (A) 02/16/2020   HDL 45 02/16/2020   LDLCALC 181 02/16/2020   TRIG 84 02/16/2020   CHOLHDL 4.7 (H) 12/07/2019   Lab Results  Component Value Date   WBC 7.8 02/16/2020   HGB 14.4 02/16/2020   HCT 42 02/16/2020   MCV 87 12/07/2019   PLT 231 02/16/2020   Lab Results  Component Value Date   IRON 70 09/24/2019   TIBC 361 09/24/2019   FERRITIN 25.1 09/24/2019   Attestation Statements:   Reviewed by clinician on day of visit: allergies, medications, problem list, medical history, surgical history, family history, social history, and previous encounter notes.  Time spent on visit including pre-visit chart review and post-visit care and charting was 22 minutes.   I, Water quality scientist, CMA, am acting as Location manager for [employer], DO.  I have reviewed  the above documentation for accuracy and completeness, and I agree with the above. [name], D.O.  The [place] was signed into law in 2016 which includes the topic of electronic health records.  This provides immediate access to information in MyChart.  This includes consultation notes, operative notes, office notes, lab results and pathology reports.  If you have any questions about what you read please let us know at your next visit so we can discuss your concerns and take corrective action if need be.  We are right here with you.

## 2020-03-21 DIAGNOSIS — H524 Presbyopia: Secondary | ICD-10-CM | POA: Diagnosis not present

## 2020-03-28 ENCOUNTER — Encounter (INDEPENDENT_AMBULATORY_CARE_PROVIDER_SITE_OTHER): Payer: Self-pay | Admitting: Family Medicine

## 2020-03-28 ENCOUNTER — Other Ambulatory Visit: Payer: Self-pay

## 2020-03-28 ENCOUNTER — Ambulatory Visit (INDEPENDENT_AMBULATORY_CARE_PROVIDER_SITE_OTHER): Payer: Medicare HMO | Admitting: Family Medicine

## 2020-03-28 VITALS — BP 121/76 | HR 71 | Temp 98.0°F | Ht 63.0 in | Wt 191.0 lb

## 2020-03-28 DIAGNOSIS — R7303 Prediabetes: Secondary | ICD-10-CM | POA: Diagnosis not present

## 2020-03-28 DIAGNOSIS — E7849 Other hyperlipidemia: Secondary | ICD-10-CM | POA: Diagnosis not present

## 2020-03-28 DIAGNOSIS — E559 Vitamin D deficiency, unspecified: Secondary | ICD-10-CM

## 2020-03-28 DIAGNOSIS — E538 Deficiency of other specified B group vitamins: Secondary | ICD-10-CM

## 2020-03-28 DIAGNOSIS — D500 Iron deficiency anemia secondary to blood loss (chronic): Secondary | ICD-10-CM | POA: Diagnosis not present

## 2020-03-28 DIAGNOSIS — Z6834 Body mass index (BMI) 34.0-34.9, adult: Secondary | ICD-10-CM

## 2020-03-28 DIAGNOSIS — E669 Obesity, unspecified: Secondary | ICD-10-CM | POA: Diagnosis not present

## 2020-03-28 MED ORDER — VITAMIN D (ERGOCALCIFEROL) 1.25 MG (50000 UNIT) PO CAPS: 50000.0000 [IU] | ORAL_CAPSULE | ORAL | 0 refills | Status: DC

## 2020-03-29 LAB — INSULIN, RANDOM: INSULIN: 8.1 u[IU]/mL (ref 2.6–24.9)

## 2020-03-29 LAB — VITAMIN D 25 HYDROXY (VIT D DEFICIENCY, FRACTURES): Vit D, 25-Hydroxy: 47 ng/mL (ref 30.0–100.0)

## 2020-03-29 LAB — HEMOGLOBIN A1C
Est. average glucose Bld gHb Est-mCnc: 120 mg/dL
Hgb A1c MFr Bld: 5.8 % — ABNORMAL HIGH (ref 4.8–5.6)

## 2020-04-03 NOTE — Progress Notes (Signed)
Chief Complaint:   OBESITY Lisa Owens is here to discuss her progress with her obesity treatment plan along with follow-up of her obesity related diagnoses. [name] is on the Category 1 Plan with breakfast options and states she is following her eating plan approximately 98% of the time. [name] states she is moving for 10 minutes 7 times per week.  Today's visit was #: 8 Starting weight: 218 lbs Starting date: 12/07/2019 Today's weight: 191 lbs Today's date: 03/28/2020 Total lbs lost to date: 17 lbs Total lbs lost since last in-office visit: 2 lbs Total weight loss percentage to date: -12.39%  Interim History: [name] denies hunger or cravings.  Assessment/Plan:   Meds ordered this encounter  Medications   Vitamin D, Ergocalciferol, (DRISDOL) 1.25 MG (50000 UNIT) CAPS capsule    Sig: Take 1 capsule (50,000 Units total) by mouth every 7 (seven) days.    Dispense:  4 capsule    Refill:  0   Orders Placed This Encounter  Procedures   Insulin, random   Hemoglobin A1c   VITAMIN D 25 Hydroxy (Vit-D Deficiency, Fractures)   Vitamin B12   Vitamin B12    1. Prediabetes Breeley has a diagnosis of prediabetes based on her elevated HgA1c and was informed this puts her at greater risk of developing diabetes. She continues to work on diet and exercise to decrease her risk of diabetes. She denies nausea or hypoglycemia.   Plan: - I reiterated and again counseled patient on pathophysiology of the disease process of I.R. and/or Pre-DM.    - Stressed importance of dietary and lifestyle modifications resulting in weight loss as first line txmnt - continue to decrease simple carbs; increase fiber and proteins -> follow meal plan  - handouts provided at pt's request after education provided.  All concerns/questions addressed.   - anticipatory guidance given.  Recheck A1c and fasting insulin level in approximately 3 months from last check or as deemed fit.   Check A1c and  fasting insulin today.  Lab Results  Component Value Date   HGBA1C 5.9 (H) 12/07/2019   Lab Results  Component Value Date   INSULIN 11.3 12/07/2019   - Insulin, random - Hemoglobin A1c     2. Other hyperlipidemia [name] has hyperlipidemia and has been trying to improve her cholesterol levels with intensive lifestyle modification including a low saturated fat diet, exercise and weight loss. She denies any chest pain, claudication or myalgias.  Plan:  Recheck cholesterol in January-February 2022.  Lab Results  Component Value Date   ALT 19 02/16/2020   AST 21 02/16/2020   ALKPHOS 53 02/16/2020   BILITOT 0.5 12/07/2019   Lab Results  Component Value Date   CHOL 241 (A) 02/16/2020   HDL 45 02/16/2020   LDLCALC 181 02/16/2020   TRIG 84 02/16/2020   CHOLHDL 4.7 (H) 12/07/2019     3. Vitamin D deficiency Myldred's Vitamin D level was 19.0 on 12/07/2019. She is currently taking prescription vitamin D 50,000 IU each week. She denies nausea, vomiting or muscle weakness.  Plan:  - Reiterated importance of vitamin D (as well as calcium) to their health and wellbeing.  - Reminded pt that weight loss will likely improve availability of vitamin D, thus encouraged [name] to continue with meal plan and their weight loss efforts to further improve this condition. - I recommend pt continue to take weekly prescription vit D 50,000 IU - Informed patient this may be a lifelong thing, and she was encouraged  to continue to take the medicine until told otherwise.   - We will need to monitor levels regularly (every 3-4 mo on average) to keep levels within normal limits.  - pt's questions and concerns regarding this condition addressed.  -Refill Vitamin D, Ergocalciferol, (DRISDOL) 1.25 MG (50000 UNIT) CAPS capsule; Take 1 capsule (50,000 Units total) by mouth every 7 (seven) days.  Dispense: 4 capsule; Refill: 0 - VITAMIN D 25 Hydroxy (Vit-D Deficiency, Fractures)    4. B12  deficiency She was on an OTC B12 supplement, but has not taken it in 3 months or so.  Plan:  Check B12 level today.  Lab Results  Component Value Date   VITAMINB12 353 03/28/2020   - Vitamin B12    5. Class 1 obesity with serious comorbidity and body mass index (BMI) of 34.0 to 34.9 in adult, unspecified obesity type  [name] is currently in the action stage of change. As such, her goal is to continue with weight loss efforts. She has agreed to the Category 1 Plan with breakfast options.   Exercise goals: As is.  Behavioral modification strategies: holiday eating strategies , celebration eating strategies and planning for success.  [name] has agreed to follow-up with our clinic in 2-3 weeks.  Will go over labs at next office visit. She was informed of the importance of frequent follow-up visits to maximize her success with intensive lifestyle modifications for her multiple health conditions.   [name] was informed we would discuss her lab results at her next visit unless there is a critical issue that needs to be addressed sooner. [name] agreed to keep her next visit at the agreed upon time to discuss these results.    Objective:   Blood pressure 121/76, pulse 71, temperature 98 F (36.7 C), height 5\' 3"  (1.6 m), weight 191 lb (86.6 kg), SpO2 98 %. Body mass index is 33.83 kg/m.  General: Cooperative, alert, well developed, in no acute distress. HEENT: Conjunctivae and lids unremarkable. Cardiovascular: Regular rhythm.  Lungs: Normal work of breathing. Neurologic: No focal deficits.   Lab Results  Component Value Date   CREATININE 0.7 02/16/2020   BUN 20 02/16/2020   NA 142 02/16/2020   K 4.2 02/16/2020   CL 104 02/16/2020   CO2 29 (A) 02/16/2020   Lab Results  Component Value Date   ALT 19 02/16/2020   AST 21 02/16/2020   ALKPHOS 53 02/16/2020   BILITOT 0.5 12/07/2019   Lab Results  Component Value Date   HGBA1C 5.8 (H) 03/28/2020   HGBA1C 5.9 (H)  12/07/2019   Lab Results  Component Value Date   INSULIN 8.1 03/28/2020   INSULIN 11.3 12/07/2019   Lab Results  Component Value Date   TSH 2.850 12/07/2019   Lab Results  Component Value Date   CHOL 241 (A) 02/16/2020   HDL 45 02/16/2020   LDLCALC 181 02/16/2020   TRIG 84 02/16/2020   CHOLHDL 4.7 (H) 12/07/2019   Lab Results  Component Value Date   WBC 7.8 02/16/2020   HGB 14.4 02/16/2020   HCT 42 02/16/2020   MCV 87 12/07/2019   PLT 231 02/16/2020   Lab Results  Component Value Date   IRON 70 09/24/2019   TIBC 361 09/24/2019   FERRITIN 25.1 09/24/2019     Obesity Behavioral Intervention:   Approximately 15 minutes were spent on the discussion below.  ASK: We discussed the diagnosis of obesity with [name] today and Marion agreed to give [name] permission to  discuss obesity behavioral modification therapy today.  ASSESS: Ibtisam has the diagnosis of obesity and her BMI today is 34.0. Elisheba is in the action stage of change.   ADVISE: Javae was educated on the multiple health risks of obesity as well as the benefit of weight loss to improve her health. She was advised of the need for long term treatment and the importance of lifestyle modifications to improve her current health and to decrease her risk of future health problems.  AGREE: Multiple dietary modification options and treatment options were discussed and Erabella agreed to follow the recommendations documented in the above note.  ARRANGE: Rhayne was educated on the importance of frequent visits to treat obesity as outlined per CMS and USPSTF guidelines and agreed to schedule her next follow up appointment today.    Attestation Statements:   Reviewed by clinician on day of visit: allergies, medications, problem list, medical history, surgical history, family history, social history, and previous encounter notes.  I, Water quality scientist, CMA, am acting as Location manager for [employer],  DO.  I have reviewed the above documentation for accuracy and completeness, and I agree with the above. [name], D.O.  The [place] was signed into law in 2016 which includes the topic of electronic health records.  This provides immediate access to information in MyChart.  This includes consultation notes, operative notes, office notes, lab results and pathology reports.  If you have any questions about what you read please let us know at your next visit so we can discuss your concerns and take corrective action if need be.  We are right here with you.

## 2020-04-10 ENCOUNTER — Ambulatory Visit (INDEPENDENT_AMBULATORY_CARE_PROVIDER_SITE_OTHER): Payer: Medicare HMO | Admitting: Family Medicine

## 2020-04-10 ENCOUNTER — Other Ambulatory Visit: Payer: Self-pay

## 2020-04-10 ENCOUNTER — Encounter (INDEPENDENT_AMBULATORY_CARE_PROVIDER_SITE_OTHER): Payer: Self-pay | Admitting: Family Medicine

## 2020-04-10 VITALS — BP 129/74 | HR 72 | Temp 98.1°F | Ht 63.0 in | Wt 189.0 lb

## 2020-04-10 DIAGNOSIS — E559 Vitamin D deficiency, unspecified: Secondary | ICD-10-CM | POA: Diagnosis not present

## 2020-04-10 DIAGNOSIS — K5909 Other constipation: Secondary | ICD-10-CM | POA: Diagnosis not present

## 2020-04-10 DIAGNOSIS — E669 Obesity, unspecified: Secondary | ICD-10-CM

## 2020-04-10 DIAGNOSIS — Z6833 Body mass index (BMI) 33.0-33.9, adult: Secondary | ICD-10-CM | POA: Diagnosis not present

## 2020-04-10 DIAGNOSIS — R7303 Prediabetes: Secondary | ICD-10-CM | POA: Diagnosis not present

## 2020-04-11 NOTE — Progress Notes (Signed)
Chief Complaint:   OBESITY Lisa Owens is here to discuss her progress with her obesity treatment plan along with follow-up of her obesity related diagnoses. Lisa Owens is on the Category 1 Plan with breakfast options and states she is following her eating plan approximately 100% of the time. Lisa Owens states she is stretching 10 minutes 7 times per week.  Today's visit was #: 9 Starting weight: 218 lbs Starting date: 12/07/2019 Today's weight: 189 lbs Today's date: 04/10/2020 Total lbs lost to date: 29 lbs Total lbs lost since last in-office visit: 2 lbs Total weight loss percentage to date: -13.30%  Interim History: Lisa Owens is doing well. She states that besides Thanksgiving Day dinner, she has stuck to the plan. She reports that she is not bored and does not need a change.  Assessment/Plan:   1. Pre-diabetes Discussed labs with patient today. Lisa Owens's A1c and fasting insulin levels are improving. Lisa Owens has a diagnosis of prediabetes based on her elevated HgA1c and was informed this puts her at greater risk of developing diabetes. She continues to work on diet and exercise to decrease her risk of diabetes. She denies nausea or hypoglycemia.  Lab Results  Component Value Date   HGBA1C 5.8 (H) 03/28/2020   Lab Results  Component Value Date   INSULIN 8.1 03/28/2020   INSULIN 11.3 12/07/2019   Plan:  - I reiterated and again counseled patient on pathophysiology of the disease process of I.R. and/or Pre-DM.    - Stressed importance of dietary and lifestyle modifications resulting in weight loss as first line txmnt - in addition we discussed the risks and benefits of metformin and various other medication options which can help [name] in the management of this disease process. Will consider them again in the future since pt wishes to not start medications at this time - continue to decrease simple carbs; increase fiber and proteins -> follow meal plan  - handouts provided at  pt's request after education provided.  All concerns/questions addressed.  - anticipatory guidance given.   - Recheck A1c and fasting insulin level in approximately 3 months from last check or as deemed fit.    2. Vitamin D deficiency Lisa Owens's Vitamin D level is almost at goal now. It was 47 on 03/28/2020. She is currently taking prescription vitamin D 50,000 IU each week. She denies nausea, vomiting or muscle weakness.  Plan: Continue current treatment plan. Low Vitamin D level contributes to fatigue and are associated with obesity, breast, and colon cancer. She agrees to continue to take prescription Vitamin D @50 ,000 IU every week and will follow-up for routine testing of Vitamin D, at least 2-3 times per year to avoid over-replacement.  3. Other constipation Lisa Owens denies issues or concerns. She is using Miralax as needed and it is seldom now.   Plan: Continue current plan of care.  4. Class 1 obesity with serious comorbidity and body mass index (BMI) of 33.0 to 33.9 in adult, unspecified obesity type Lisa Owens is currently in the action stage of change. As such, her goal is to continue with weight loss efforts. She has agreed to the Category 1 Plan with breakfast options.   Exercise goals: All adults should avoid inactivity. Some physical activity is better than none, and adults who participate in any amount of physical activity gain some health benefits.  Behavioral modification strategies: increasing water intake, meal planning and cooking strategies, holiday eating strategies  and planning for success.  Lisa Owens has agreed to follow-up with our clinic  in 2-3 weeks. She was informed of the importance of frequent follow-up visits to maximize her success with intensive lifestyle modifications for her multiple health conditions.   Objective:   Blood pressure 129/74, pulse 72, temperature 98.1 F (36.7 C), height 5\' 3"  (1.6 m), weight 189 lb (85.7 kg), SpO2 97 %. Body mass index  is 33.48 kg/m.  General: Cooperative, alert, well developed, in no acute distress. HEENT: Conjunctivae and lids unremarkable. Cardiovascular: Regular rhythm.  Lungs: Normal work of breathing. Neurologic: No focal deficits.   Lab Results  Component Value Date   CREATININE 0.7 02/16/2020   BUN 20 02/16/2020   NA 142 02/16/2020   K 4.2 02/16/2020   CL 104 02/16/2020   CO2 29 (A) 02/16/2020   Lab Results  Component Value Date   ALT 19 02/16/2020   AST 21 02/16/2020   ALKPHOS 53 02/16/2020   BILITOT 0.5 12/07/2019   Lab Results  Component Value Date   HGBA1C 5.8 (H) 03/28/2020   HGBA1C 5.9 (H) 12/07/2019   Lab Results  Component Value Date   INSULIN 8.1 03/28/2020   INSULIN 11.3 12/07/2019   Lab Results  Component Value Date   TSH 2.850 12/07/2019   Lab Results  Component Value Date   CHOL 241 (A) 02/16/2020   HDL 45 02/16/2020   LDLCALC 181 02/16/2020   TRIG 84 02/16/2020   CHOLHDL 4.7 (H) 12/07/2019   Lab Results  Component Value Date   WBC 7.8 02/16/2020   HGB 14.4 02/16/2020   HCT 42 02/16/2020   MCV 87 12/07/2019   PLT 231 02/16/2020   Lab Results  Component Value Date   IRON 70 09/24/2019   TIBC 361 09/24/2019   FERRITIN 25.1 09/24/2019    Obesity Behavioral Intervention:   Approximately 15 minutes were spent on the discussion below.  ASK: We discussed the diagnosis of obesity with Lisa Owens today and Lisa Owens agreed to give [name] permission to discuss obesity behavioral modification therapy today.  ASSESS: Lisa Owens has the diagnosis of obesity and her BMI today is 33.5. Lisa Owens is in the action stage of change.   ADVISE: Lisa Owens was educated on the multiple health risks of obesity as well as the benefit of weight loss to improve her health. She was advised of the need for long term treatment and the importance of lifestyle modifications to improve her current health and to decrease her risk of future health problems.  AGREE: Multiple  dietary modification options and treatment options were discussed and Lisa Owens agreed to follow the recommendations documented in the above note.  ARRANGE: Lisa Owens was educated on the importance of frequent visits to treat obesity as outlined per CMS and USPSTF guidelines and agreed to schedule her next follow up appointment today.  Attestation Statements:   Reviewed by clinician on day of visit: allergies, medications, problem list, medical history, surgical history, family history, social history, and previous encounter notes.  Lisa Owens, am acting as Location manager for [employer], DO.  I have reviewed the above documentation for accuracy and completeness, and I agree with the above. Lisa Owens, D.O.  The [place] was signed into law in 2016 which includes the topic of electronic health records.  This provides immediate access to information in MyChart.  This includes consultation notes, operative notes, office notes, lab results and pathology reports.  If you have any questions about what you read please let us know at your next visit so we can discuss  your concerns and take corrective action if need be.  We are right here with you.

## 2020-04-25 ENCOUNTER — Other Ambulatory Visit: Payer: Self-pay

## 2020-04-25 ENCOUNTER — Ambulatory Visit (INDEPENDENT_AMBULATORY_CARE_PROVIDER_SITE_OTHER): Payer: Medicare HMO | Admitting: Family Medicine

## 2020-04-25 ENCOUNTER — Encounter (INDEPENDENT_AMBULATORY_CARE_PROVIDER_SITE_OTHER): Payer: Self-pay | Admitting: Family Medicine

## 2020-04-25 VITALS — BP 128/84 | HR 69 | Temp 98.0°F | Ht 63.0 in | Wt 187.0 lb

## 2020-04-25 DIAGNOSIS — Z6833 Body mass index (BMI) 33.0-33.9, adult: Secondary | ICD-10-CM

## 2020-04-25 DIAGNOSIS — E669 Obesity, unspecified: Secondary | ICD-10-CM | POA: Diagnosis not present

## 2020-04-25 DIAGNOSIS — E559 Vitamin D deficiency, unspecified: Secondary | ICD-10-CM

## 2020-04-25 MED ORDER — VITAMIN D (ERGOCALCIFEROL) 1.25 MG (50000 UNIT) PO CAPS: 50000.0000 [IU] | ORAL_CAPSULE | ORAL | 0 refills | Status: DC

## 2020-04-25 NOTE — Progress Notes (Signed)
Chief Complaint:   OBESITY Lisa Owens is here to discuss her progress with her obesity treatment plan along with follow-up of her obesity related diagnoses. Lisa Owens is on the Category 1 Plan with breakfast options and states she is following her eating plan approximately 90% of the time. Lisa Owens states she is stretching and walking 10 minutes 7 times per week.  Today's visit was #: 10 Starting weight: 218 lbs Starting date: 12/07/2019 Today's weight: 187 lbs Today's date: 04/25/2020 Total lbs lost to date: 31 lbs Total lbs lost since last in-office visit: 2 lbs Total weight loss percentage to date: -14.22%  Interim History: [name] states she was afraid this time because she has been baking.  Assessment/Plan:   1. Vitamin D deficiency [name]'s Vitamin D level was 47 on 03/28/2020. She is currently taking prescription vitamin D 50,000 IU each week. She denies nausea, vomiting or muscle weakness.   Ref. Range 03/28/2020 10:47  Vitamin D, 25-Hydroxy Latest Ref Range: 30.0 - 100.0 ng/mL 47.0   Plan: Refill Vit D for 1 month, as per below. - Reiterated importance of vitamin D (as well as calcium) to their health and wellbeing.  - Reminded [name] that weight loss will likely improve availability of vitamin D, thus encouraged her to continue with meal plan and their weight loss efforts to further improve this condition. - I recommend patient continue to take weekly prescription vit D 50,000 IU - Informed patient this may be a lifelong thing, and she was encouraged to continue to take the medicine until told otherwise.   - we will need to monitor levels regularly (every 3-4 mo on average) to keep levels within normal limits.  - weight loss will likely improve availability of vitamin D, thus encouraged Lisa Owens to continue with meal plan and their weight loss efforts to further improve this condition - pt's questions and concerns regarding this condition  addressed.   Refill- Vitamin D, Ergocalciferol, (DRISDOL) 1.25 MG (50000 UNIT) CAPS capsule; Take 1 capsule (50,000 Units total) by mouth every 7 (seven) days.  Dispense: 4 capsule; Refill: 0  2. Class 1 obesity with serious comorbidity and body mass index (BMI) of 33.0 to 33.9 in adult, unspecified obesity type [name] is currently in the action stage of change. As such, her goal is to continue with weight loss efforts. She has agreed to the Category 1 Plan with breakfast options.   Exercise goals: Increase as tolerated.  Behavioral modification strategies: holiday eating strategies  and celebration eating strategies.  [name] has agreed to follow-up with our clinic in 3 weeks. She was informed of the importance of frequent follow-up visits to maximize her success with intensive lifestyle modifications for her multiple health conditions.   Objective:   Blood pressure 128/84, pulse 69, temperature 98 F (36.7 C), height 5\' 3"  (1.6 m), weight 187 lb (84.8 kg), SpO2 97 %. Body mass index is 33.13 kg/m.  General: Cooperative, alert, well developed, in no acute distress. HEENT: Conjunctivae and lids unremarkable. Cardiovascular: Regular rhythm.  Lungs: Normal work of breathing. Neurologic: No focal deficits.   Lab Results  Component Value Date   CREATININE 0.7 02/16/2020   BUN 20 02/16/2020   NA 142 02/16/2020   K 4.2 02/16/2020   CL 104 02/16/2020   CO2 29 (A) 02/16/2020   Lab Results  Component Value Date   ALT 19 02/16/2020   AST 21 02/16/2020   ALKPHOS 53 02/16/2020   BILITOT 0.5 12/07/2019   Lab Results  Component Value Date   HGBA1C 5.8 (H) 03/28/2020   HGBA1C 5.9 (H) 12/07/2019   Lab Results  Component Value Date   INSULIN 8.1 03/28/2020   INSULIN 11.3 12/07/2019   Lab Results  Component Value Date   TSH 2.850 12/07/2019   Lab Results  Component Value Date   CHOL 241 (A) 02/16/2020   HDL 45 02/16/2020   LDLCALC 181 02/16/2020   TRIG 84 02/16/2020    CHOLHDL 4.7 (H) 12/07/2019   Lab Results  Component Value Date   WBC 7.8 02/16/2020   HGB 14.4 02/16/2020   HCT 42 02/16/2020   MCV 87 12/07/2019   PLT 231 02/16/2020   Lab Results  Component Value Date   IRON 70 09/24/2019   TIBC 361 09/24/2019   FERRITIN 25.1 09/24/2019    Obesity Behavioral Intervention:   Approximately 15 minutes were spent on the discussion below.  ASK: We discussed the diagnosis of obesity with Lisa Owens today and Lisa Owens agreed to give [name] permission to discuss obesity behavioral modification therapy today.  ASSESS: [name] has the diagnosis of obesity and her BMI today is 33.2. [name] is in the action stage of change.   ADVISE: Lisa Owens was educated on the multiple health risks of obesity as well as the benefit of weight loss to improve her health. She was advised of the need for long term treatment and the importance of lifestyle modifications to improve her current health and to decrease her risk of future health problems.  AGREE: Multiple dietary modification options and treatment options were discussed and Lisa Owens agreed to follow the recommendations documented in the above note.  ARRANGE: Lisa Owens was educated on the importance of frequent visits to treat obesity as outlined per CMS and USPSTF guidelines and agreed to schedule her next follow up appointment today.  Attestation Statements:   Reviewed by clinician on day of visit: allergies, medications, problem list, medical history, surgical history, family history, social history, and previous encounter notes.  Coral Ceo, am acting as Location manager for [employer], DO.  I have reviewed the above documentation for accuracy and completeness, and I agree with the above. [name], D.O.  The [place] was signed into law in 2016 which includes the topic of electronic health records.  This provides immediate access to information in MyChart.   This includes consultation notes, operative notes, office notes, lab results and pathology reports.  If you have any questions about what you read please let us know at your next visit so we can discuss your concerns and take corrective action if need be.  We are right here with you.

## 2020-05-17 ENCOUNTER — Other Ambulatory Visit: Payer: Self-pay

## 2020-05-17 ENCOUNTER — Ambulatory Visit (INDEPENDENT_AMBULATORY_CARE_PROVIDER_SITE_OTHER): Payer: Medicare HMO | Admitting: Family Medicine

## 2020-05-17 ENCOUNTER — Encounter (INDEPENDENT_AMBULATORY_CARE_PROVIDER_SITE_OTHER): Payer: Self-pay | Admitting: Family Medicine

## 2020-05-17 VITALS — BP 127/79 | HR 71 | Temp 97.8°F | Ht 63.0 in | Wt 189.0 lb

## 2020-05-17 DIAGNOSIS — E559 Vitamin D deficiency, unspecified: Secondary | ICD-10-CM | POA: Diagnosis not present

## 2020-05-17 DIAGNOSIS — E669 Obesity, unspecified: Secondary | ICD-10-CM

## 2020-05-17 DIAGNOSIS — Z6833 Body mass index (BMI) 33.0-33.9, adult: Secondary | ICD-10-CM

## 2020-05-17 MED ORDER — VITAMIN D (ERGOCALCIFEROL) 1.25 MG (50000 UNIT) PO CAPS: 50000.0000 [IU] | ORAL_CAPSULE | ORAL | 0 refills | Status: DC

## 2020-05-22 NOTE — Progress Notes (Signed)
Chief Complaint:   OBESITY Lisa Owens is here to discuss her progress with her obesity treatment plan along with follow-up of her obesity related diagnoses. Lisa Owens is on the Category 1 Plan with breakfast optionsand states she is following her eating plan approximately 75% of the time. Lisa Owens states she is stretching 10 minutes 7 times per week.  Today's visit was #: 11 Starting weight: 218 lbs Starting date: 12/07/2019 Today's weight: 189 lbs Today's date: 05/17/2020 Total lbs lost to date: 29 lbs Total lbs lost since last in-office visit: +2 lbs Total weight loss percentage to date: -13.30%  Interim History: Lisa Owens reports that she had a wonderful time over the holidays with lots of goodies. She says it's the first time since the end of July that she really let loose. She plans on buying foods and getting back on track tomorrow.   Assessment/Plan:   1. Vitamin D deficiency Delorise's Vitamin D level was 47.0 on 03/28/2020. She is currently taking prescription vitamin D 50,000 IU each week. She denies nausea, vomiting or muscle weakness.  Ref. Range 03/28/2020 10:47  Vitamin D, 25-Hydroxy Latest Ref Range: 30.0 - 100.0 ng/mL 47.0   Plan: Refill Vit D for 1 month, as per below. Low Vitamin D level contributes to fatigue and are associated with obesity, breast, and colon cancer. She agrees to continue to take prescription Vitamin D @50 ,000 IU every week and will follow-up for routine testing of Vitamin D, at least 2-3 times per year to avoid over-replacement.  Refill- Vitamin D, Ergocalciferol, (DRISDOL) 1.25 MG (50000 UNIT) CAPS capsule; Take 1 capsule (50,000 Units total) by mouth every 7 (seven) days.  Dispense: 4 capsule; Refill: 0  2. Class 1 obesity with serious comorbidity and body mass index (BMI) of 33.0 to 33.9 in adult, unspecified obesity type Lisa Owens is currently in the action stage of change. As such, her goal is to continue with weight loss efforts. She has  agreed to the Category 1 Plan with breakfast options.   Exercise goals: As is  Behavioral modification strategies: meal planning and cooking strategies, keeping healthy foods in the home, avoiding temptations and planning for success.  Lisa Owens has agreed to follow-up with our clinic in 2 weeks. She was informed of the importance of frequent follow-up visits to maximize her success with intensive lifestyle modifications for her multiple health conditions.    Objective:   Blood pressure 127/79, pulse 71, temperature 97.8 F (36.6 C), height 5\' 3"  (1.6 m), weight 189 lb (85.7 kg), SpO2 96 %. Body mass index is 33.48 kg/m.  General: Cooperative, alert, well developed, in no acute distress. HEENT: Conjunctivae and lids unremarkable. Cardiovascular: Regular rhythm.  Lungs: Normal work of breathing. Neurologic: No focal deficits.   Lab Results  Component Value Date   CREATININE 0.7 02/16/2020   BUN 20 02/16/2020   NA 142 02/16/2020   K 4.2 02/16/2020   CL 104 02/16/2020   CO2 29 (A) 02/16/2020   Lab Results  Component Value Date   ALT 19 02/16/2020   AST 21 02/16/2020   ALKPHOS 53 02/16/2020   BILITOT 0.5 12/07/2019   Lab Results  Component Value Date   HGBA1C 5.8 (H) 03/28/2020   HGBA1C 5.9 (H) 12/07/2019   Lab Results  Component Value Date   INSULIN 8.1 03/28/2020   INSULIN 11.3 12/07/2019   Lab Results  Component Value Date   TSH 2.850 12/07/2019   Lab Results  Component Value Date   CHOL 241 (A)  02/16/2020   HDL 45 02/16/2020   LDLCALC 181 02/16/2020   TRIG 84 02/16/2020   CHOLHDL 4.7 (H) 12/07/2019   Lab Results  Component Value Date   WBC 7.8 02/16/2020   HGB 14.4 02/16/2020   HCT 42 02/16/2020   MCV 87 12/07/2019   PLT 231 02/16/2020   Lab Results  Component Value Date   IRON 70 09/24/2019   TIBC 361 09/24/2019   FERRITIN 25.1 09/24/2019    Obesity Behavioral Intervention:   Approximately 15 minutes were spent on the discussion  below.  ASK: We discussed the diagnosis of obesity with [name] today and Hilary agreed to give [name] permission to discuss obesity behavioral modification therapy today.  ASSESS: Alechia has the diagnosis of obesity and her BMI today is 33.5. Kayna is in the action stage of change.   ADVISE: Mineola was educated on the multiple health risks of obesity as well as the benefit of weight loss to improve her health. She was advised of the need for long term treatment and the importance of lifestyle modifications to improve her current health and to decrease her risk of future health problems.  AGREE: Multiple dietary modification options and treatment options were discussed and Porshea agreed to follow the recommendations documented in the above note.  ARRANGE: Yanissa was educated on the importance of frequent visits to treat obesity as outlined per CMS and USPSTF guidelines and agreed to schedule her next follow up appointment today.  Attestation Statements:   Reviewed by clinician on day of visit: allergies, medications, problem list, medical history, surgical history, family history, social history, and previous encounter notes.  Coral Ceo, am acting as Location manager for [employer], DO.  I have reviewed the above documentation for accuracy and completeness, and I agree with the above. [name], D.O.  The [place] was signed into law in 2016 which includes the topic of electronic health records.  This provides immediate access to information in MyChart.  This includes consultation notes, operative notes, office notes, lab results and pathology reports.  If you have any questions about what you read please let us know at your next visit so we can discuss your concerns and take corrective action if need be.  We are right here with you.

## 2020-05-31 ENCOUNTER — Encounter (INDEPENDENT_AMBULATORY_CARE_PROVIDER_SITE_OTHER): Payer: Self-pay | Admitting: Family Medicine

## 2020-05-31 ENCOUNTER — Telehealth (INDEPENDENT_AMBULATORY_CARE_PROVIDER_SITE_OTHER): Payer: Self-pay

## 2020-05-31 ENCOUNTER — Telehealth (INDEPENDENT_AMBULATORY_CARE_PROVIDER_SITE_OTHER): Payer: Medicare HMO | Admitting: Family Medicine

## 2020-05-31 DIAGNOSIS — E559 Vitamin D deficiency, unspecified: Secondary | ICD-10-CM | POA: Diagnosis not present

## 2020-05-31 DIAGNOSIS — Z6833 Body mass index (BMI) 33.0-33.9, adult: Secondary | ICD-10-CM | POA: Diagnosis not present

## 2020-05-31 DIAGNOSIS — E669 Obesity, unspecified: Secondary | ICD-10-CM

## 2020-05-31 DIAGNOSIS — I10 Essential (primary) hypertension: Secondary | ICD-10-CM

## 2020-05-31 DIAGNOSIS — R7303 Prediabetes: Secondary | ICD-10-CM | POA: Diagnosis not present

## 2020-05-31 MED ORDER — VITAMIN D (ERGOCALCIFEROL) 1.25 MG (50000 UNIT) PO CAPS: 50000.0000 [IU] | ORAL_CAPSULE | ORAL | 0 refills | Status: DC

## 2020-05-31 NOTE — Telephone Encounter (Signed)
Verbal consent obtained to conduct video visit, via telehealth 

## 2020-06-03 NOTE — Progress Notes (Signed)
TeleHealth Visit:  Due to the COVID-19 pandemic, this visit was completed with telemedicine (audio/video) technology to reduce patient and provider exposure as well as to preserve personal protective equipment.   Lisa Owens has verbally consented to this TeleHealth visit. The patient is located at home, the provider is located at the [facility] office. The participants in this visit include the listed provider and patient. The visit was conducted today via video.   Chief Complaint: OBESITY Lisa Owens is here to discuss her progress with her obesity treatment plan along with follow-up of her obesity related diagnoses. Lisa Owens is on the Category 1 Plan with breakfast options and states she is following her eating plan approximately 85% of the time. Lisa Owens states she is stretching 10 minutes 7 times per week.  Today's visit was #: 12 Starting weight: 218 lbs Starting date: 12/07/2019  Interim History: Pt had family members staying with them last week from [place], but other than a couple of days off, she is eating on plan even with visitors. No issues or concerns. "The plan works" and "I am satisfied."  Plan: Ok to change it up and use [employer] protein shakes in substitution for the 2 eggs in the morning on some days.   Assessment/Plan:   Meds ordered this encounter  Medications  . Vitamin D, Ergocalciferol, (DRISDOL) 1.25 MG (50000 UNIT) CAPS capsule    Sig: Take 1 capsule (50,000 Units total) by mouth every 7 (seven) days.    Dispense:  4 capsule    Refill:  0    1. Vitamin D deficiency Lisa Owens does no exercise, except cooking and housework. ? When last bone density was. Her Vitamin D level was 47.0 on 03/28/2020. She is currently taking prescription vitamin D 50,000 IU each week. She denies nausea, vomiting or muscle weakness.  Ref. Range 03/28/2020 10:47  Vitamin D, 25-Hydroxy Latest Ref Range: 30.0 - 100.0 ng/mL 47.0   Plan: Refill Vit D for 1 month, as  per below. Recommend she contact PCP regarding last bone density , +/- mammogram and what they recommend for pt. Pt is 80 years old, but very young 12 and we discussed current guidelines for screening for breast cancer and osteoporosis. She will contact PCP. Low Vitamin D level contributes to fatigue and are associated with obesity, breast, and colon cancer. She agrees to continue to take prescription Vitamin D @50 ,000 IU every week and will follow-up for routine testing of Vitamin D, at least 2-3 times per year to avoid over-replacement.  Refill- Vitamin D, Ergocalciferol, (DRISDOL) 1.25 MG (50000 UNIT) CAPS capsule; Take 1 capsule (50,000 Units total) by mouth every 7 (seven) days.  Dispense: 4 capsule; Refill: 0  2. Pre-diabetes Pt is not on meds. Treatment plan via prudent nutritional plan and weight loss currently. A1c 5.8 about 2 months ago.   Lab Results  Component Value Date   HGBA1C 5.8 (H) 03/28/2020   Lab Results  Component Value Date   INSULIN 8.1 03/28/2020   INSULIN 11.3 12/07/2019   Plan: Pt aware that certain meds can be used in pre-diabetes, which also helps with weight loss. She declines a change in treatment plan at this time.   3. Essential hypertension No issues and BP within normal limits at home. Tolerating Norvasc well without side effects. No new symptoms or concerns.  Review: taking medications as instructed, no medication side effects noted, no chest pain on exertion, no dyspnea on exertion, no swelling of ankles.   BP Readings  from Last 3 Encounters:  05/17/20 127/79  04/25/20 128/84  04/10/20 129/74   Plan: Pt aware with weight loss, BP will decrease. Home blood pressure monitoring recommended 2-3 days a week as she continues to increase exercise, eat prudent nutritional plan with low salt and loss weight. Continue meds at current dose for now.  4. Class 1 obesity with serious comorbidity and body mass index (BMI) of 33.0 to 33.9 in adult, unspecified obesity  type Lisa Owens is currently in the action stage of change. As such, her goal is to continue with weight loss efforts. She has agreed to the Category 1 Plan with breakfast options.   Exercise goals: All adults should avoid inactivity. Some physical activity is better than none, and adults who participate in any amount of physical activity gain some health benefits.  Behavioral modification strategies: increasing water intake, better snacking choices, dealing with family or coworker sabotage and planning for success.  [name] has agreed to follow-up with our clinic in 2 weeks on 06/14/2020 at 1400. She was informed of the importance of frequent follow-up visits to maximize her success with intensive lifestyle modifications for her multiple health conditions.  Objective:   VITALS: Per patient if applicable, see vitals. GENERAL: Alert and in no acute distress. CARDIOPULMONARY: No increased WOB. Speaking in clear sentences.  PSYCH: Pleasant and cooperative. Speech normal rate and rhythm. Affect is appropriate. Insight and judgement are appropriate. Attention is focused, linear, and appropriate.  NEURO: Oriented as arrived to appointment on time with no prompting.   Lab Results  Component Value Date   CREATININE 0.7 02/16/2020   BUN 20 02/16/2020   NA 142 02/16/2020   K 4.2 02/16/2020   CL 104 02/16/2020   CO2 29 (A) 02/16/2020   Lab Results  Component Value Date   ALT 19 02/16/2020   AST 21 02/16/2020   ALKPHOS 53 02/16/2020   BILITOT 0.5 12/07/2019   Lab Results  Component Value Date   HGBA1C 5.8 (H) 03/28/2020   HGBA1C 5.9 (H) 12/07/2019   Lab Results  Component Value Date   INSULIN 8.1 03/28/2020   INSULIN 11.3 12/07/2019   Lab Results  Component Value Date   TSH 2.850 12/07/2019   Lab Results  Component Value Date   CHOL 241 (A) 02/16/2020   HDL 45 02/16/2020   LDLCALC 181 02/16/2020   TRIG 84 02/16/2020   CHOLHDL 4.7 (H) 12/07/2019   Lab Results  Component  Value Date   WBC 7.8 02/16/2020   HGB 14.4 02/16/2020   HCT 42 02/16/2020   MCV 87 12/07/2019   PLT 231 02/16/2020   Lab Results  Component Value Date   IRON 70 09/24/2019   TIBC 361 09/24/2019   FERRITIN 25.1 09/24/2019    Attestation Statements:   Reviewed by clinician on day of visit: allergies, medications, problem list, medical history, surgical history, family history, social history, and previous encounter notes.  Coral Ceo, am acting as Location manager for [employer], DO.  I have reviewed the above documentation for accuracy and completeness, and I agree with the above. [name], D.O.  The [place] was signed into law in 2016 which includes the topic of electronic health records.  This provides immediate access to information in MyChart.  This includes consultation notes, operative notes, office notes, lab results and pathology reports.  If you have any questions about what you read please let us know at your next visit so we can  discuss your concerns and take corrective action if need be.  We are right here with you.

## 2020-06-07 ENCOUNTER — Other Ambulatory Visit: Payer: Self-pay | Admitting: Family Medicine

## 2020-06-07 DIAGNOSIS — M85852 Other specified disorders of bone density and structure, left thigh: Secondary | ICD-10-CM

## 2020-06-14 ENCOUNTER — Encounter (INDEPENDENT_AMBULATORY_CARE_PROVIDER_SITE_OTHER): Payer: Self-pay | Admitting: Family Medicine

## 2020-06-14 ENCOUNTER — Ambulatory Visit (INDEPENDENT_AMBULATORY_CARE_PROVIDER_SITE_OTHER): Payer: Medicare HMO | Admitting: Family Medicine

## 2020-06-14 ENCOUNTER — Other Ambulatory Visit: Payer: Self-pay

## 2020-06-14 VITALS — BP 128/79 | HR 70 | Temp 98.3°F | Ht 63.0 in | Wt 184.0 lb

## 2020-06-14 DIAGNOSIS — E669 Obesity, unspecified: Secondary | ICD-10-CM

## 2020-06-14 DIAGNOSIS — Z6832 Body mass index (BMI) 32.0-32.9, adult: Secondary | ICD-10-CM | POA: Diagnosis not present

## 2020-06-14 DIAGNOSIS — I10 Essential (primary) hypertension: Secondary | ICD-10-CM | POA: Diagnosis not present

## 2020-06-14 DIAGNOSIS — E559 Vitamin D deficiency, unspecified: Secondary | ICD-10-CM | POA: Diagnosis not present

## 2020-06-19 NOTE — Progress Notes (Signed)
Chief Complaint:   OBESITY Lisa Owens is here to discuss her progress with her obesity treatment plan along with follow-up of her obesity related diagnoses.   Today's visit was #: 13 Starting weight: 218 lbs Starting date: 12/07/2019 Today's weight: 184 lbs Today's date: 06/14/2020 Total lbs lost to date: 34 lbs Body mass index is 32.59 kg/m.  Total weight loss percentage to date: -15.60%  Interim History: Lisa Owens is here for a follow up office visit.  she is following the meal plan without concern or issues.  Patient's meal and food recall appears to be accurate and consistent with what is on the plan.  When on plan, her hunger and cravings are well controlled.    Nutrition Plan: Category 1 Plan with Breakfast Options for 99% of the time. Activity: Stretching for 10 minutes 7 times per week.  Assessment/Plan:   1. Essential hypertension At goal. Medications: Norvasc 5 mg daily.  Asymptomatic.  No concerns.  Plan: Avoid buying foods that are: processed, frozen, or prepackaged to avoid excess salt. We will continue to monitor closely alongside her PCP and/or Specialist.  Regular follow up with PCP and specialists was also encouraged.   BP Readings from Last 3 Encounters:  06/14/20 128/79  05/17/20 127/79  04/25/20 128/84   Lab Results  Component Value Date   CREATININE 0.7 02/16/2020   2. Vitamin D deficiency Improving, but not optimized. Current vitamin D is 47.0, tested on 03/28/2020. Optimal goal > 50 ng/dL.  Lisa Owens says she called to schedule a bone density test, per our records.  She is going in June 2022.  Plan: Continue to take prescription Vitamin D @50 ,000 IU every week as prescribed.  Follow-up for routine testing of Vitamin D, at least 2-3 times per year to avoid over-replacement.  Increase weight bearing exercise.  Continue prudent nutritional plan.  3. Class 1 obesity with serious comorbidity and body mass index (BMI) of 32.0 to 32.9 in adult,  unspecified obesity type  Course: Lisa Owens is currently in the action stage of change. As such, her goal is to continue with weight loss efforts.   Nutrition goals: She has agreed to the Category 1 Plan with breakfast options.   Exercise goals: Increase as tolerated.  Behavioral modification strategies: meal planning and cooking strategies.  Lisa Owens has agreed to follow-up with our clinic in 2 weeks. She was informed of the importance of frequent follow-up visits to maximize her success with intensive lifestyle modifications for her multiple health conditions.   Objective:   Blood pressure 128/79, pulse 70, temperature 98.3 F (36.8 C), height 5\' 3"  (1.6 m), weight 184 lb (83.5 kg), SpO2 97 %. Body mass index is 32.59 kg/m.  General: Cooperative, alert, well developed, in no acute distress. HEENT: Conjunctivae and lids unremarkable. Cardiovascular: Regular rhythm.  Lungs: Normal work of breathing. Neurologic: No focal deficits.   Lab Results  Component Value Date   CREATININE 0.7 02/16/2020   BUN 20 02/16/2020   NA 142 02/16/2020   K 4.2 02/16/2020   CL 104 02/16/2020   CO2 29 (A) 02/16/2020   Lab Results  Component Value Date   ALT 19 02/16/2020   AST 21 02/16/2020   ALKPHOS 53 02/16/2020   BILITOT 0.5 12/07/2019   Lab Results  Component Value Date   HGBA1C 5.8 (H) 03/28/2020   HGBA1C 5.9 (H) 12/07/2019   Lab Results  Component Value Date   INSULIN 8.1 03/28/2020   INSULIN 11.3 12/07/2019   Lab  Results  Component Value Date   TSH 2.850 12/07/2019   Lab Results  Component Value Date   CHOL 241 (A) 02/16/2020   HDL 45 02/16/2020   LDLCALC 181 02/16/2020   TRIG 84 02/16/2020   CHOLHDL 4.7 (H) 12/07/2019   Lab Results  Component Value Date   WBC 7.8 02/16/2020   HGB 14.4 02/16/2020   HCT 42 02/16/2020   MCV 87 12/07/2019   PLT 231 02/16/2020   Lab Results  Component Value Date   IRON 70 09/24/2019   TIBC 361 09/24/2019   FERRITIN 25.1  09/24/2019   Obesity Behavioral Intervention:   Approximately 15 minutes were spent on the discussion below.  ASK: We discussed the diagnosis of obesity with Lisa Owens today and Lisa Owens agreed to give [name] permission to discuss obesity behavioral modification therapy today.  ASSESS: Lisa Owens has the diagnosis of obesity and her BMI today is 32.7. Lisa Owens is in the action stage of change.   ADVISE: Lisa Owens was educated on the multiple health risks of obesity as well as the benefit of weight loss to improve her health. She was advised of the need for long term treatment and the importance of lifestyle modifications to improve her current health and to decrease her risk of future health problems.  AGREE: Multiple dietary modification options and treatment options were discussed and Lisa Owens agreed to follow the recommendations documented in the above note.  ARRANGE: Lisa Owens was educated on the importance of frequent visits to treat obesity as outlined per CMS and USPSTF guidelines and agreed to schedule her next follow up appointment today.  Attestation Statements:   Reviewed by clinician on day of visit: allergies, medications, problem list, medical history, surgical history, family history, social history, and previous encounter notes.  I, Water quality scientist, CMA, am acting as Location manager for [employer], DO.  I have reviewed the above documentation for accuracy and completeness, and I agree with the above. Lisa Owens, D.O.  The [place] was signed into law in 2016 which includes the topic of electronic health records.  This provides immediate access to information in MyChart.  This includes consultation notes, operative notes, office notes, lab results and pathology reports.  If you have any questions about what you read please let us know at your next visit so we can discuss your concerns and take corrective action if need be.  We are right here with  you.

## 2020-06-26 ENCOUNTER — Other Ambulatory Visit: Payer: Self-pay

## 2020-06-26 ENCOUNTER — Encounter (INDEPENDENT_AMBULATORY_CARE_PROVIDER_SITE_OTHER): Payer: Self-pay | Admitting: Family Medicine

## 2020-06-26 ENCOUNTER — Telehealth (INDEPENDENT_AMBULATORY_CARE_PROVIDER_SITE_OTHER): Payer: Medicare HMO | Admitting: Family Medicine

## 2020-06-26 DIAGNOSIS — Z6832 Body mass index (BMI) 32.0-32.9, adult: Secondary | ICD-10-CM

## 2020-06-26 DIAGNOSIS — E669 Obesity, unspecified: Secondary | ICD-10-CM | POA: Diagnosis not present

## 2020-06-26 DIAGNOSIS — E559 Vitamin D deficiency, unspecified: Secondary | ICD-10-CM

## 2020-06-26 DIAGNOSIS — E66811 Obesity, class 1: Secondary | ICD-10-CM

## 2020-06-26 MED ORDER — VITAMIN D (ERGOCALCIFEROL) 1.25 MG (50000 UNIT) PO CAPS
50000.0000 [IU] | ORAL_CAPSULE | ORAL | 0 refills | Status: DC
Start: 2020-06-26 — End: 2020-07-12

## 2020-07-03 NOTE — Progress Notes (Signed)
TeleHealth Visit:  Due to the COVID-19 pandemic, this visit was completed with telemedicine (audio/video) technology to reduce patient and provider exposure as well as to preserve personal protective equipment.   Lisa Owens has verbally consented to this TeleHealth visit. The patient is located at home, the provider is located at the [facility] office. The participants in this visit include the listed provider and patient and. The visit was conducted today via [place].  OBESITY Miguelina is here to discuss her progress with her obesity treatment plan along with follow-up of her obesity related diagnoses.   Today's visit was #: 14 Starting weight: 218 lbs Starting date: 12/07/2019 Today's date: 06/26/2020  Interim History: Adriel says that her 27 year old husband is going for hip replacement surgery in 2 days.  Some increase stress with that and planning for that.  Otherwise, no issues with the meal plan or hunger/cravings.  Nutrition Plan: Category 1 Plan with breakfast options for 80% of the time. Activity: Stretching for 10 minutes 7 times per week. Stress: Elevated. This patient is following the prescribed meal plan meal without concerns.  Food recall appears to be consistent with the prescribed plan.  When following the plan, hunger and cravings are well controlled.    Assessment/Plan:   1. Vitamin D deficiency Improving, but not optimized. Current vitamin D is 47.0, tested on 03/28/2020. Optimal goal > 50 ng/dL.   Plan: Continue to take prescription Vitamin D @50 ,000 IU every week as prescribed.  Follow-up for routine testing of Vitamin D, at least 2-3 times per year to avoid over-replacement.  - Refill Vitamin D, Ergocalciferol, (DRISDOL) 1.25 MG (50000 UNIT) CAPS capsule; Take 1 capsule (50,000 Units total) by mouth every 7 (seven) days.  Dispense: 4 capsule; Refill: 0  2. Class 1 obesity with serious comorbidity and body mass index (BMI) of 32.0 to 32.9  in adult, unspecified obesity type  Course: Jaine is currently in the action stage of change. As such, her goal is to continue with weight loss efforts.   Nutrition goals: She has agreed to the Category 1 Plan with breakfast options.   Exercise goals: Older adults should follow the adult guidelines. When older adults cannot meet the adult guidelines, they should be as physically active as their abilities and conditions will allow.  Older adults should do exercises that maintain or improve balance if they are at risk of falling.  Increase as tolerated.  Behavioral modification strategies: meal planning and cooking strategies and planning for success.  Lisa Owens has agreed to follow-up with our clinic in 2-3 weeks. She was informed of the importance of frequent follow-up visits to maximize her success with intensive lifestyle modifications for her multiple health conditions.   Objective:   General: Cooperative, alert, well developed, in no acute distress. HEENT: Conjunctivae and lids unremarkable. Cardiovascular: Regular rhythm.  Lungs: Normal work of breathing. Neurologic: No focal deficits.   Lab Results  Component Value Date   CREATININE 0.7 02/16/2020   BUN 20 02/16/2020   NA 142 02/16/2020   K 4.2 02/16/2020   CL 104 02/16/2020   CO2 29 (A) 02/16/2020   Lab Results  Component Value Date   ALT 19 02/16/2020   AST 21 02/16/2020   ALKPHOS 53 02/16/2020   BILITOT 0.5 12/07/2019   Lab Results  Component Value Date   HGBA1C 5.8 (H) 03/28/2020   HGBA1C 5.9 (H) 12/07/2019   Lab Results  Component Value Date   INSULIN 8.1 03/28/2020  INSULIN 11.3 12/07/2019   Lab Results  Component Value Date   TSH 2.850 12/07/2019   Lab Results  Component Value Date   CHOL 241 (A) 02/16/2020   HDL 45 02/16/2020   LDLCALC 181 02/16/2020   TRIG 84 02/16/2020   CHOLHDL 4.7 (H) 12/07/2019   Lab Results  Component Value Date   WBC 7.8 02/16/2020   HGB 14.4 02/16/2020   HCT 42  02/16/2020   MCV 87 12/07/2019   PLT 231 02/16/2020   Lab Results  Component Value Date   IRON 70 09/24/2019   TIBC 361 09/24/2019   FERRITIN 25.1 09/24/2019   Attestation Statements:   Reviewed by clinician on day of visit: allergies, medications, problem list, medical history, surgical history, family history, social history, and previous encounter notes.  I, Water quality scientist, CMA, am acting as Location manager for [employer], DO.  I have reviewed the above documentation for accuracy and completeness, and I agree with the above. [name], D.O.  The [place] was signed into law in 2016 which includes the topic of electronic health records.  This provides immediate access to information in MyChart.  This includes consultation notes, operative notes, office notes, lab results and pathology reports.  If you have any questions about what you read please let us know at your next visit so we can discuss your concerns and take corrective action if need be.  We are right here with you.

## 2020-07-12 ENCOUNTER — Encounter (INDEPENDENT_AMBULATORY_CARE_PROVIDER_SITE_OTHER): Payer: Self-pay | Admitting: Family Medicine

## 2020-07-12 ENCOUNTER — Other Ambulatory Visit: Payer: Self-pay

## 2020-07-12 ENCOUNTER — Ambulatory Visit (INDEPENDENT_AMBULATORY_CARE_PROVIDER_SITE_OTHER): Payer: Medicare HMO | Admitting: Family Medicine

## 2020-07-12 VITALS — BP 127/74 | HR 73 | Temp 97.9°F | Ht 63.0 in | Wt 182.0 lb

## 2020-07-12 DIAGNOSIS — E538 Deficiency of other specified B group vitamins: Secondary | ICD-10-CM | POA: Diagnosis not present

## 2020-07-12 DIAGNOSIS — I10 Essential (primary) hypertension: Secondary | ICD-10-CM

## 2020-07-12 DIAGNOSIS — R7303 Prediabetes: Secondary | ICD-10-CM

## 2020-07-12 DIAGNOSIS — E669 Obesity, unspecified: Secondary | ICD-10-CM | POA: Diagnosis not present

## 2020-07-12 DIAGNOSIS — E7849 Other hyperlipidemia: Secondary | ICD-10-CM

## 2020-07-12 DIAGNOSIS — E559 Vitamin D deficiency, unspecified: Secondary | ICD-10-CM

## 2020-07-12 DIAGNOSIS — Z6832 Body mass index (BMI) 32.0-32.9, adult: Secondary | ICD-10-CM | POA: Diagnosis not present

## 2020-07-12 MED ORDER — VITAMIN D (ERGOCALCIFEROL) 1.25 MG (50000 UNIT) PO CAPS: 50000.0000 [IU] | ORAL_CAPSULE | ORAL | 0 refills | Status: DC

## 2020-07-13 NOTE — Progress Notes (Signed)
Chief Complaint:   OBESITY Lisa Owens is here to discuss her progress with her obesity treatment plan along with follow-up of her obesity related diagnoses.   Today's visit was #: 15 Starting weight: 218 lbs Starting date: 12/07/2019 Today's weight: 182 lbs Today's date: 07/12/2020 Total lbs lost to date: 36 lbs Body mass index is 32.24 kg/m.  Total weight loss percentage to date: -16.51%  Interim History:  Lisa Owens is here for a follow up office visit.  she is following the meal plan without concern or issues.  Patient's meal and food recall appears to be accurate and consistent with what is on the plan.  When on plan, her hunger and cravings are well controlled.    Current Meal Plan: the Category 1 Plan with breakfast options for 95% of the time.  Current Exercise Plan: Stretching for 10 minutes 7 times per week.  Assessment/Plan:   1. Essential hypertension Not at goal. Medications: Norvasc 5 mg daily.  No symptoms of hypotension.  No concerns but does not regularly check blood pressure at home.  Plan: Avoid buying foods that are: processed, frozen, or prepackaged to avoid excess salt. We will watch for signs of hypotension as she continues lifestyle modifications. We will continue to monitor closely alongside her PCP and/or Specialist.  Regular follow up with PCP and specialists was also encouraged.  Check blood pressure every 2-3 days or more often if symptoms develop.  Increase exercise.  BP Readings from Last 3 Encounters:  07/12/20 127/74  06/14/20 128/79  05/17/20 127/79   Lab Results  Component Value Date   CREATININE 0.7 02/16/2020   2. Prediabetes Not at goal. Goal is HgbA1c < 5.7.  Medication: None.    Plan:  She will continue to focus on protein-rich, low simple carbohydrate foods. We reviewed the importance of hydration, regular exercise for stress reduction, and restorative sleep.  Will check A1c and fasting insulin at next office visit.  Lab  Results  Component Value Date   HGBA1C 5.8 (H) 03/28/2020   Lab Results  Component Value Date   INSULIN 8.1 03/28/2020   INSULIN 11.3 12/07/2019   - Hemoglobin A1c - Insulin, random  3. Vitamin D deficiency Improving, but not optimized. Current vitamin D is 47.0, tested on 03/28/2020. Optimal goal > 50 ng/dL.  She is taking vitamin D 50,000 IU daily.  Plan: Continue to take prescription Vitamin D @50 ,000 IU every week as prescribed.  Will check vitamin D level at next office visit.  - Refill Vitamin D, Ergocalciferol, (DRISDOL) 1.25 MG (50000 UNIT) CAPS capsule; Take 1 capsule (50,000 Units total) by mouth every 7 (seven) days.  Dispense: 4 capsule; Refill: 0 - VITAMIN D 25 Hydroxy (Vit-D Deficiency, Fractures)  4. Other hyperlipidemia Course: Not at goal. Lipid-lowering medications: None.  Declines statin.  Last LDL 181 around 4 months ago.  Plan: Dietary changes: Increase soluble fiber, decrease simple carbohydrates, decrease saturated fat. Exercise changes: Moderate to vigorous-intensity aerobic activity 150 minutes per week or as tolerated. We will continue to monitor along with PCP/specialists as it pertains to her weight loss journey.  Will check FLP at next office visit.  Lab Results  Component Value Date   CHOL 241 (A) 02/16/2020   HDL 45 02/16/2020   LDLCALC 181 02/16/2020   TRIG 84 02/16/2020   CHOLHDL 4.7 (H) 12/07/2019   Lab Results  Component Value Date   ALT 19 02/16/2020   AST 21 02/16/2020   ALKPHOS 53 02/16/2020  BILITOT 0.5 12/07/2019   - Lipid panel  5. B12 deficiency Lab Results  Component Value Date   VITAMINB12 313 12/07/2019   Lisa Owens is not on a vitamin B12 supplement at this time.  Plan:  Check vitamin B12 level at next office visit.   - Vitamin B12  6. Class 1 obesity with serious comorbidity and body mass index (BMI) of 32.0 to 32.9 in adult, unspecified obesity type  Course: Lisa Owens is currently in the action stage of change. As  such, her goal is to continue with weight loss efforts.   Nutrition goals: She has agreed to the Category 1 Plan.   Exercise goals: All adults should avoid inactivity. Some physical activity is better than none, and adults who participate in any amount of physical activity gain some health benefits. Increase cardio, just walking.  Behavioral modification strategies: increasing lean protein intake and better snacking choices.  Lisa Owens has agreed to follow-up with our clinic in 2-3 weeks, fasting. She was informed of the importance of frequent follow-up visits to maximize her success with intensive lifestyle modifications for her multiple health conditions.   Objective:   Blood pressure 127/74, pulse 73, temperature 97.9 F (36.6 C), height 5\' 3"  (1.6 m), weight 182 lb (82.6 kg), SpO2 96 %. Body mass index is 32.24 kg/m.  General: Cooperative, alert, well developed, in no acute distress. HEENT: Conjunctivae and lids unremarkable. Cardiovascular: Regular rhythm.  Lungs: Normal work of breathing. Neurologic: No focal deficits.   Lab Results  Component Value Date   CREATININE 0.7 02/16/2020   BUN 20 02/16/2020   NA 142 02/16/2020   K 4.2 02/16/2020   CL 104 02/16/2020   CO2 29 (A) 02/16/2020   Lab Results  Component Value Date   ALT 19 02/16/2020   AST 21 02/16/2020   ALKPHOS 53 02/16/2020   BILITOT 0.5 12/07/2019   Lab Results  Component Value Date   HGBA1C 5.8 (H) 03/28/2020   HGBA1C 5.9 (H) 12/07/2019   Lab Results  Component Value Date   INSULIN 8.1 03/28/2020   INSULIN 11.3 12/07/2019   Lab Results  Component Value Date   TSH 2.850 12/07/2019   Lab Results  Component Value Date   CHOL 241 (A) 02/16/2020   HDL 45 02/16/2020   LDLCALC 181 02/16/2020   TRIG 84 02/16/2020   CHOLHDL 4.7 (H) 12/07/2019   Lab Results  Component Value Date   WBC 7.8 02/16/2020   HGB 14.4 02/16/2020   HCT 42 02/16/2020   MCV 87 12/07/2019   PLT 231 02/16/2020   Lab  Results  Component Value Date   IRON 70 09/24/2019   TIBC 361 09/24/2019   FERRITIN 25.1 09/24/2019   Obesity Behavioral Intervention:   Approximately 15 minutes were spent on the discussion below.  ASK: We discussed the diagnosis of obesity with Lisa Owens today and Lisa Owens agreed to give [name] permission to discuss obesity behavioral modification therapy today.  ASSESS: Lisa Owens has the diagnosis of obesity and her BMI today is 32.3. Alvenia is in the action stage of change.   ADVISE: Lisa Owens was educated on the multiple health risks of obesity as well as the benefit of weight loss to improve her health. She was advised of the need for long term treatment and the importance of lifestyle modifications to improve her current health and to decrease her risk of future health problems.  AGREE: Multiple dietary modification options and treatment options were discussed and Lisa Owens agreed to follow the recommendations documented  in the above note.  ARRANGE: Ying was educated on the importance of frequent visits to treat obesity as outlined per CMS and USPSTF guidelines and agreed to schedule her next follow up appointment today.  Attestation Statements:   Reviewed by clinician on day of visit: allergies, medications, problem list, medical history, surgical history, family history, social history, and previous encounter notes.  I, Water quality scientist, CMA, am acting as Location manager for [employer], DO.  I have reviewed the above documentation for accuracy and completeness, and I agree with the above. [name], D.O.  The [place] was signed into law in 2016 which includes the topic of electronic health records.  This provides immediate access to information in MyChart.  This includes consultation notes, operative notes, office notes, lab results and pathology reports.  If you have any questions about what you read please let us know at your next visit so  we can discuss your concerns and take corrective action if need be.  We are right here with you.

## 2020-07-31 ENCOUNTER — Ambulatory Visit (INDEPENDENT_AMBULATORY_CARE_PROVIDER_SITE_OTHER): Payer: Medicare HMO | Admitting: Family Medicine

## 2020-07-31 ENCOUNTER — Encounter (INDEPENDENT_AMBULATORY_CARE_PROVIDER_SITE_OTHER): Payer: Self-pay | Admitting: Family Medicine

## 2020-07-31 ENCOUNTER — Other Ambulatory Visit: Payer: Self-pay

## 2020-07-31 VITALS — BP 124/77 | HR 55 | Temp 97.9°F | Ht 63.0 in | Wt 180.0 lb

## 2020-07-31 DIAGNOSIS — E559 Vitamin D deficiency, unspecified: Secondary | ICD-10-CM | POA: Diagnosis not present

## 2020-07-31 DIAGNOSIS — R7303 Prediabetes: Secondary | ICD-10-CM | POA: Diagnosis not present

## 2020-07-31 DIAGNOSIS — E538 Deficiency of other specified B group vitamins: Secondary | ICD-10-CM | POA: Diagnosis not present

## 2020-07-31 DIAGNOSIS — Z6838 Body mass index (BMI) 38.0-38.9, adult: Secondary | ICD-10-CM

## 2020-07-31 DIAGNOSIS — E7849 Other hyperlipidemia: Secondary | ICD-10-CM | POA: Diagnosis not present

## 2020-07-31 MED ORDER — VITAMIN D (ERGOCALCIFEROL) 1.25 MG (50000 UNIT) PO CAPS: 50000.0000 [IU] | ORAL_CAPSULE | ORAL | 0 refills | Status: DC

## 2020-08-01 LAB — INSULIN, RANDOM: INSULIN: 4.9 u[IU]/mL (ref 2.6–24.9)

## 2020-08-01 LAB — LIPID PANEL
Chol/HDL Ratio: 4.8 ratio — ABNORMAL HIGH (ref 0.0–4.4)
Cholesterol, Total: 246 mg/dL — ABNORMAL HIGH (ref 100–199)
HDL: 51 mg/dL (ref >39)
LDL Chol Calc (NIH): 184 mg/dL — ABNORMAL HIGH (ref 0–99)
Triglycerides: 65 mg/dL (ref 0–149)
VLDL Cholesterol Cal: 11 mg/dL (ref 5–40)

## 2020-08-01 LAB — HEMOGLOBIN A1C
Est. average glucose Bld gHb Est-mCnc: 114 mg/dL
Hgb A1c MFr Bld: 5.6 % (ref 4.8–5.6)

## 2020-08-01 LAB — VITAMIN D 25 HYDROXY (VIT D DEFICIENCY, FRACTURES): Vit D, 25-Hydroxy: 52.9 ng/mL (ref 30.0–100.0)

## 2020-08-01 LAB — VITAMIN B12: Vitamin B-12: 302 pg/mL (ref 232–1245)

## 2020-08-07 NOTE — Progress Notes (Signed)
Chief Complaint:   OBESITY Lisa Owens is here to discuss her progress with her obesity treatment plan along with follow-up of her obesity related diagnoses.   Today's visit was #: 16 Starting weight: 218 lbs Starting date: 12/07/2019 Today's weight: 180 lbs Today's date: 07/31/2020 Total lbs lost to date: 38 lbs Body mass index is 31.89 kg/m.  Total weight loss percentage to date: -17.43%  Interim History:  Lisa Owens is here for a follow up office visit and she is following the meal plan without concerns or issues.  Patient's meal and food recall appears to be accurate and consistent with what is on the plan.  When on plan, her hunger and cravings are well controlled.    Current Meal Plan: the Category 1 Plan for 95% of the time.  Current Exercise Plan: Stretching at the microwave for 10 minutes 7 times per week.  Assessment/Plan:   No orders of the defined types were placed in this encounter.   Medications Discontinued During This Encounter  Medication Reason  . Vitamin D, Ergocalciferol, (DRISDOL) 1.25 MG (50000 UNIT) CAPS capsule Reorder     Meds ordered this encounter  Medications  . Vitamin D, Ergocalciferol, (DRISDOL) 1.25 MG (50000 UNIT) CAPS capsule    Sig: Take 1 capsule (50,000 Units total) by mouth every 7 (seven) days.    Dispense:  4 capsule    Refill:  0      1. Vitamin D deficiency Improving, but not optimized. Current vitamin D is 47.0, tested on 03/28/2020. Optimal goal > 50 ng/dL.  She is taking vitamin D 50,000 IU weekly.  Plan: Continue to take prescription Vitamin D @50 ,000 IU every week as prescribed.  Follow-up for routine testing of Vitamin D, at least 2-3 times per year to avoid over-replacement.  - Refill Vitamin D, Ergocalciferol, (DRISDOL) 1.25 MG (50000 UNIT) CAPS capsule; Take 1 capsule (50,000 Units total) by mouth every 7 (seven) days.  Dispense: 4 capsule; Refill: 0  2. Obesity, current BMI 32.0  Course: [name] is  currently in the action stage of change. As such, her goal is to continue with weight loss efforts.   Nutrition goals: She has agreed to the Category 1 Plan and keeping a food journal and adhering to recommended goals of 250-300 calories and 20+ grams of protein at breakfast.   Exercise goals: All adults should avoid inactivity. Some physical activity is better than none, and adults who participate in any amount of physical activity gain some health benefits.  Behavioral modification strategies: increasing lean protein intake, decreasing simple carbohydrates and planning for success.  [name] has agreed to follow-up with our clinic in 3 weeks. She was informed of the importance of frequent follow-up visits to maximize her success with intensive lifestyle modifications for her multiple health conditions.   Objective:   Blood pressure 124/77, pulse (!) 55, temperature 97.9 F (36.6 C), height 5\' 3"  (1.6 m), weight 180 lb (81.6 kg), SpO2 99 %. Body mass index is 31.89 kg/m.  General: Cooperative, alert, well developed, in no acute distress. HEENT: Conjunctivae and lids unremarkable. Cardiovascular: Regular rhythm.  Lungs: Normal work of breathing. Neurologic: No focal deficits.   Lab Results  Component Value Date   CREATININE 0.7 02/16/2020   BUN 20 02/16/2020   NA 142 02/16/2020   K 4.2 02/16/2020   CL 104 02/16/2020   CO2 29 (A) 02/16/2020   Lab Results  Component Value Date   ALT 19 02/16/2020   AST 21  02/16/2020   ALKPHOS 53 02/16/2020   BILITOT 0.5 12/07/2019   Lab Results  Component Value Date   HGBA1C 5.6 07/31/2020   HGBA1C 5.8 (H) 03/28/2020   HGBA1C 5.9 (H) 12/07/2019   Lab Results  Component Value Date   INSULIN 4.9 07/31/2020   INSULIN 8.1 03/28/2020   INSULIN 11.3 12/07/2019   Lab Results  Component Value Date   TSH 2.850 12/07/2019   Lab Results  Component Value Date   CHOL 246 (H) 07/31/2020   HDL 51 07/31/2020   LDLCALC 184 (H) 07/31/2020    TRIG 65 07/31/2020   CHOLHDL 4.8 (H) 07/31/2020   Lab Results  Component Value Date   WBC 7.8 02/16/2020   HGB 14.4 02/16/2020   HCT 42 02/16/2020   MCV 87 12/07/2019   PLT 231 02/16/2020   Lab Results  Component Value Date   IRON 70 09/24/2019   TIBC 361 09/24/2019   FERRITIN 25.1 09/24/2019   Obesity Behavioral Intervention:   Approximately 15 minutes were spent on the discussion below.  ASK: We discussed the diagnosis of obesity with [name] today and [name] agreed to give [name] permission to discuss obesity behavioral modification therapy today.  ASSESS: [name] has the diagnosis of obesity and her BMI today is 32.0. [name] is in the action stage of change.   ADVISE: [name] was educated on the multiple health risks of obesity as well as the benefit of weight loss to improve her health. She was advised of the need for long term treatment and the importance of lifestyle modifications to improve her current health and to decrease her risk of future health problems.  AGREE: Multiple dietary modification options and treatment options were discussed and [name] agreed to follow the recommendations documented in the above note.  ARRANGE: [name] was educated on the importance of frequent visits to treat obesity as outlined per CMS and USPSTF guidelines and agreed to schedule her next follow up appointment today.  Attestation Statements:   Reviewed by clinician on day of visit: allergies, medications, problem list, medical history, surgical history, family history, social history, and previous encounter notes.  I, Water quality scientist, CMA, am acting as Location manager for [employer], DO.  I have reviewed the above documentation for accuracy and completeness, and I agree with the above. [name], D.O.  The [place] was signed into law in 2016 which includes the topic of electronic health records.  This provides immediate access to  information in MyChart.  This includes consultation notes, operative notes, office notes, lab results and pathology reports.  If you have any questions about what you read please let us know at your next visit so we can discuss your concerns and take corrective action if need be.  We are right here with you.

## 2020-08-16 DIAGNOSIS — I700 Atherosclerosis of aorta: Secondary | ICD-10-CM | POA: Diagnosis not present

## 2020-08-16 DIAGNOSIS — Z7901 Long term (current) use of anticoagulants: Secondary | ICD-10-CM | POA: Diagnosis not present

## 2020-08-16 DIAGNOSIS — Z86718 Personal history of other venous thrombosis and embolism: Secondary | ICD-10-CM | POA: Diagnosis not present

## 2020-08-16 DIAGNOSIS — I10 Essential (primary) hypertension: Secondary | ICD-10-CM | POA: Diagnosis not present

## 2020-08-16 DIAGNOSIS — R7303 Prediabetes: Secondary | ICD-10-CM | POA: Diagnosis not present

## 2020-08-23 ENCOUNTER — Ambulatory Visit (INDEPENDENT_AMBULATORY_CARE_PROVIDER_SITE_OTHER): Payer: Medicare HMO | Admitting: Family Medicine

## 2020-08-30 DIAGNOSIS — R141 Gas pain: Secondary | ICD-10-CM | POA: Diagnosis not present

## 2020-09-13 ENCOUNTER — Other Ambulatory Visit: Payer: Self-pay

## 2020-09-13 ENCOUNTER — Encounter (INDEPENDENT_AMBULATORY_CARE_PROVIDER_SITE_OTHER): Payer: Self-pay | Admitting: Family Medicine

## 2020-09-13 ENCOUNTER — Ambulatory Visit (INDEPENDENT_AMBULATORY_CARE_PROVIDER_SITE_OTHER): Payer: Medicare HMO | Admitting: Family Medicine

## 2020-09-13 VITALS — BP 115/71 | HR 71 | Temp 98.1°F | Ht 63.0 in | Wt 179.0 lb

## 2020-09-13 DIAGNOSIS — E559 Vitamin D deficiency, unspecified: Secondary | ICD-10-CM

## 2020-09-13 DIAGNOSIS — Z6838 Body mass index (BMI) 38.0-38.9, adult: Secondary | ICD-10-CM | POA: Diagnosis not present

## 2020-09-13 MED ORDER — VITAMIN D (ERGOCALCIFEROL) 1.25 MG (50000 UNIT) PO CAPS: 50000.0000 [IU] | ORAL_CAPSULE | ORAL | 0 refills | Status: DC

## 2020-09-21 NOTE — Progress Notes (Signed)
Chief Complaint:   OBESITY Lisa Owens is here to discuss her progress with her obesity treatment plan along with follow-up of her obesity related diagnoses.   Today's visit was #: 19 Starting weight: 218 lbs Starting date: 12/07/2019 Today's weight: 179 lbs Today's date: 09/13/2020 Weight change since last visit: 1 lb Total lbs lost to date: 39 lbs Body mass index is 31.71 kg/m.  Total weight loss percentage to date: -17.89%  Interim History:  [name] says that over the last 2 weeks she had stomach issues and pains.  She went to her PCP for treatment.  She was unable to eat much at all for 7-10 days.  Still unable to eat all foods on plan (skipping some).  Current Meal Plan: the Category 2 Plan and keeping a food journal and adhering to recommended goals of 250-300 calories and 20 grams of protein at breakfast for 75% of the time.  Current Exercise Plan: None.  Assessment/Plan:   No orders of the defined types were placed in this encounter.   Medications Discontinued During This Encounter  Medication Reason  . Vitamin D, Ergocalciferol, (DRISDOL) 1.25 MG (50000 UNIT) CAPS capsule Reorder     Meds ordered this encounter  Medications  . Vitamin D, Ergocalciferol, (DRISDOL) 1.25 MG (50000 UNIT) CAPS capsule    Sig: Take 1 capsule (50,000 Units total) by mouth every 7 (seven) days.    Dispense:  4 capsule    Refill:  0    1. Vitamin D deficiency At goal. Current vitamin D is 52.9, tested on 07/31/2020. Optimal goal > 50 ng/dL.  She is taking vitamin D 50,000 IU weekly.  Plan: Continue to take prescription Vitamin D @50 ,000 IU every week as prescribed.  Follow-up for routine testing of Vitamin D, at least 2-3 times per year to avoid over-replacement.  - Refill Vitamin D, Ergocalciferol, (DRISDOL) 1.25 MG (50000 UNIT) CAPS capsule; Take 1 capsule (50,000 Units total) by mouth every 7 (seven) days.  Dispense: 4 capsule; Refill: 0  2. Obesity, current BMI 31.7  Course:  [name] is currently in the action stage of change. As such, her goal is to continue with weight loss efforts.   Nutrition goals: She has agreed to the Category 1 Plan and keeping a food journal and adhering to recommended goals of 250-300 calories and 20 grams of protein at breakfast.   Exercise goals: Start walking for 10 minutes per day.  Behavioral modification strategies: increasing lean protein intake, decreasing simple carbohydrates and no skipping meals.  [name] has agreed to follow-up with our clinic in 2-3 weeks. She was informed of the importance of frequent follow-up visits to maximize her success with intensive lifestyle modifications for her multiple health conditions.   Objective:   Blood pressure 115/71, pulse 71, temperature 98.1 F (36.7 C), height 5\' 3"  (1.6 m), weight 179 lb (81.2 kg), SpO2 96 %. Body mass index is 31.71 kg/m.  General: Cooperative, alert, well developed, in no acute distress. HEENT: Conjunctivae and lids unremarkable. Cardiovascular: Regular rhythm.  Lungs: Normal work of breathing. Neurologic: No focal deficits.   Lab Results  Component Value Date   CREATININE 0.7 02/16/2020   BUN 20 02/16/2020   NA 142 02/16/2020   K 4.2 02/16/2020   CL 104 02/16/2020   CO2 29 (A) 02/16/2020   Lab Results  Component Value Date   ALT 19 02/16/2020   AST 21 02/16/2020   ALKPHOS 53 02/16/2020   BILITOT 0.5 12/07/2019   Lab  Results  Component Value Date   HGBA1C 5.6 07/31/2020   HGBA1C 5.8 (H) 03/28/2020   HGBA1C 5.9 (H) 12/07/2019   Lab Results  Component Value Date   INSULIN 4.9 07/31/2020   INSULIN 8.1 03/28/2020   INSULIN 11.3 12/07/2019   Lab Results  Component Value Date   TSH 2.850 12/07/2019   Lab Results  Component Value Date   CHOL 246 (H) 07/31/2020   HDL 51 07/31/2020   LDLCALC 184 (H) 07/31/2020   TRIG 65 07/31/2020   CHOLHDL 4.8 (H) 07/31/2020   Lab Results  Component Value Date   WBC 7.8 02/16/2020   HGB 14.4  02/16/2020   HCT 42 02/16/2020   MCV 87 12/07/2019   PLT 231 02/16/2020   Lab Results  Component Value Date   IRON 70 09/24/2019   TIBC 361 09/24/2019   FERRITIN 25.1 09/24/2019   Obesity Behavioral Intervention:   Approximately 15 minutes were spent on the discussion below.  ASK: We discussed the diagnosis of obesity with [name] today and [name] agreed to give [name] permission to discuss obesity behavioral modification therapy today.  ASSESS: [name] has the diagnosis of obesity and her BMI today is 31.7. Leenah is in the action stage of change.   ADVISE: [name] was educated on the multiple health risks of obesity as well as the benefit of weight loss to improve her health. She was advised of the need for long term treatment and the importance of lifestyle modifications to improve her current health and to decrease her risk of future health problems.  AGREE: Multiple dietary modification options and treatment options were discussed and [name] agreed to follow the recommendations documented in the above note.  ARRANGE: [name] was educated on the importance of frequent visits to treat obesity as outlined per CMS and USPSTF guidelines and agreed to schedule her next follow up appointment today.  Attestation Statements:   Reviewed by clinician on day of visit: allergies, medications, problem list, medical history, surgical history, family history, social history, and previous encounter notes.  I, Water quality scientist, CMA, am acting as Location manager for [employer], DO.  I have reviewed the above documentation for accuracy and completeness, and I agree with the above. [name], D.O.  The [place] was signed into law in 2016 which includes the topic of electronic health records.  This provides immediate access to information in MyChart.  This includes consultation notes, operative notes, office notes, lab results and pathology reports.  If  you have any questions about what you read please let us know at your next visit so we can discuss your concerns and take corrective action if need be.  We are right here with you.

## 2020-10-10 ENCOUNTER — Ambulatory Visit (INDEPENDENT_AMBULATORY_CARE_PROVIDER_SITE_OTHER): Payer: Medicare HMO | Admitting: Family Medicine

## 2020-10-10 ENCOUNTER — Other Ambulatory Visit: Payer: Self-pay

## 2020-10-10 ENCOUNTER — Encounter (INDEPENDENT_AMBULATORY_CARE_PROVIDER_SITE_OTHER): Payer: Self-pay | Admitting: Family Medicine

## 2020-10-10 VITALS — BP 118/73 | HR 77 | Temp 98.6°F | Ht 63.0 in | Wt 179.0 lb

## 2020-10-10 DIAGNOSIS — Z6838 Body mass index (BMI) 38.0-38.9, adult: Secondary | ICD-10-CM | POA: Diagnosis not present

## 2020-10-10 DIAGNOSIS — K5909 Other constipation: Secondary | ICD-10-CM | POA: Diagnosis not present

## 2020-10-10 DIAGNOSIS — I10 Essential (primary) hypertension: Secondary | ICD-10-CM | POA: Diagnosis not present

## 2020-10-12 ENCOUNTER — Other Ambulatory Visit: Payer: Self-pay

## 2020-10-12 ENCOUNTER — Ambulatory Visit
Admission: RE | Admit: 2020-10-12 | Discharge: 2020-10-12 | Disposition: A | Discharge status: Home / Self Care | Payer: Medicare HMO | Source: Ambulatory Visit | Attending: Family Medicine | Admitting: Family Medicine

## 2020-10-12 DIAGNOSIS — M85852 Other specified disorders of bone density and structure, left thigh: Secondary | ICD-10-CM

## 2020-10-12 DIAGNOSIS — Z780 Asymptomatic menopausal state: Secondary | ICD-10-CM | POA: Diagnosis not present

## 2020-10-18 NOTE — Progress Notes (Signed)
Chief Complaint:   OBESITY Lisa Owens is here to discuss her progress with her obesity treatment plan along with follow-up of her obesity related diagnoses.   Today's visit was #: 18 Starting weight: 218 lbs Starting date: 12/07/2019 Today's weight: 179 lbs Today's date: 10/10/2020 Weight change since last visit: 0 Total lbs lost to date: 39 lbs Body mass index is 31.71 kg/m.  Total weight loss percentage to date: -17.89%  Interim History:  Lisa Owens had a [birth year] on 09/24/2021.  She has been eating poorly since.  She says she is glad she has not gained weight.  She has been dealing with her brother's poor health and her husband's increased stress.  She is planning a trip to [facility] June 8th-16th.  Current Meal Plan: the Category 1 Plan and keeping a food journal and adhering to recommended goals of 250-300 calories and 20 grams of protein at breakfast for 75% of the time.  Current Exercise Plan: None.  Assessment/Plan:   1. Essential hypertension At goal. Medications: Norvasc 5 mg daily.  Tolerating well.  Plan:  At goal.  Continue Norvasc.  Avoid buying foods that are: processed, frozen, or prepackaged to avoid excess salt. We will watch for signs of hypotension as she continues lifestyle modifications. We will continue to monitor closely alongside her PCP and/or Specialist.  Regular follow up with PCP and specialists was also encouraged.   BP Readings from Last 3 Encounters:  10/10/20 118/73  09/13/20 115/71  07/31/20 124/77   Lab Results  Component Value Date   CREATININE 0.7 02/16/2020   2. Other constipation This problem is well controlled. Lisa Owens was informed that a decrease in bowel movement frequency is normal while losing weight, but stools should not be hard or painful.  She is taking MiraLAX as needed.  Last used it several weeks ago.  Plan:  Controlled.  Continue MiraLAX as needed.  Counseling: Getting to Good Bowel Health: Your goal is to have one soft  bowel movement each day. Drink at least 8 glasses of water each day. Eat plenty of fiber (goal is over 30 grams each day). It is best to get most of your fiber from dietary sources which includes leafy green vegetables, fresh fruit, and whole grains. You may need to add fiber with the help of OTC fiber supplements. These include Metamucil, Citrucel, and Benefiber. If you are still having trouble, try adding an osmotic laxative such as Miralax. If all of these changes do not work, Cabin crew.   3. Obesity, current BMI 31.8  Course: Lisa Owens is currently in the action stage of change. As such, her goal is to continue with weight loss efforts.   Nutrition goals: She has agreed to the Category 1 Plan and keeping a food journal and adhering to recommended goals of 250-300 calories and 20 grams of protein at breakfast.   Exercise goals:  As is.  Behavioral modification strategies: meal planning and cooking strategies and planning for success.  Lisa Owens has agreed to follow-up with our clinic in 3-4 weeks after trip. She was informed of the importance of frequent follow-up visits to maximize her success with intensive lifestyle modifications for her multiple health conditions.   Objective:   Blood pressure 118/73, pulse 77, temperature 98.6 F (37 C), height 5\' 3"  (1.6 m), weight 179 lb (81.2 kg), SpO2 98 %. Body mass index is 31.71 kg/m.  General: Cooperative, alert, well developed, in no acute distress. HEENT: Conjunctivae and lids unremarkable. Cardiovascular: Regular  rhythm.  Lungs: Normal work of breathing. Neurologic: No focal deficits.   Lab Results  Component Value Date   CREATININE 0.7 02/16/2020   BUN 20 02/16/2020   NA 142 02/16/2020   K 4.2 02/16/2020   CL 104 02/16/2020   CO2 29 (A) 02/16/2020   Lab Results  Component Value Date   ALT 19 02/16/2020   AST 21 02/16/2020   ALKPHOS 53 02/16/2020   BILITOT 0.5 12/07/2019   Lab Results  Component Value Date    HGBA1C 5.6 07/31/2020   HGBA1C 5.8 (H) 03/28/2020   HGBA1C 5.9 (H) 12/07/2019   Lab Results  Component Value Date   INSULIN 4.9 07/31/2020   INSULIN 8.1 03/28/2020   INSULIN 11.3 12/07/2019   Lab Results  Component Value Date   TSH 2.850 12/07/2019   Lab Results  Component Value Date   CHOL 246 (H) 07/31/2020   HDL 51 07/31/2020   LDLCALC 184 (H) 07/31/2020   TRIG 65 07/31/2020   CHOLHDL 4.8 (H) 07/31/2020   Lab Results  Component Value Date   WBC 7.8 02/16/2020   HGB 14.4 02/16/2020   HCT 42 02/16/2020   MCV 87 12/07/2019   PLT 231 02/16/2020   Lab Results  Component Value Date   IRON 70 09/24/2019   TIBC 361 09/24/2019   FERRITIN 25.1 09/24/2019   Obesity Behavioral Intervention:   Approximately 15 minutes were spent on the discussion below.  ASK: We discussed the diagnosis of obesity with [name] today and Dhruvi agreed to give [name] permission to discuss obesity behavioral modification therapy today.  ASSESS: Warren has the diagnosis of obesity and her BMI today is 31.8. Larcenia is in the action stage of change.   ADVISE: Valisa was educated on the multiple health risks of obesity as well as the benefit of weight loss to improve her health. She was advised of the need for long term treatment and the importance of lifestyle modifications to improve her current health and to decrease her risk of future health problems.  AGREE: Multiple dietary modification options and treatment options were discussed and Tequlia agreed to follow the recommendations documented in the above note.  ARRANGE: Kataleia was educated on the importance of frequent visits to treat obesity as outlined per CMS and USPSTF guidelines and agreed to schedule her next follow up appointment today.  Attestation Statements:   Reviewed by clinician on day of visit: allergies, medications, problem list, medical history, surgical history, family history, social history, and  previous encounter notes.  I, Water quality scientist, CMA, am acting as Location manager for [employer], DO.  I have reviewed the above documentation for accuracy and completeness, and I agree with the above. [name], D.O.  The [place] was signed into law in 2016 which includes the topic of electronic health records.  This provides immediate access to information in MyChart.  This includes consultation notes, operative notes, office notes, lab results and pathology reports.  If you have any questions about what you read please let us know at your next visit so we can discuss your concerns and take corrective action if need be.  We are right here with you.

## 2020-11-07 ENCOUNTER — Other Ambulatory Visit: Payer: Self-pay

## 2020-11-07 ENCOUNTER — Ambulatory Visit (INDEPENDENT_AMBULATORY_CARE_PROVIDER_SITE_OTHER): Payer: Medicare HMO | Admitting: Family Medicine

## 2020-11-07 ENCOUNTER — Encounter (INDEPENDENT_AMBULATORY_CARE_PROVIDER_SITE_OTHER): Payer: Self-pay | Admitting: Family Medicine

## 2020-11-07 VITALS — BP 137/83 | HR 73 | Temp 98.4°F | Ht 63.0 in | Wt 179.0 lb

## 2020-11-07 DIAGNOSIS — I10 Essential (primary) hypertension: Secondary | ICD-10-CM | POA: Diagnosis not present

## 2020-11-07 DIAGNOSIS — Z6838 Body mass index (BMI) 38.0-38.9, adult: Secondary | ICD-10-CM | POA: Diagnosis not present

## 2020-11-07 DIAGNOSIS — E559 Vitamin D deficiency, unspecified: Secondary | ICD-10-CM | POA: Diagnosis not present

## 2020-11-07 MED ORDER — VITAMIN D (ERGOCALCIFEROL) 1.25 MG (50000 UNIT) PO CAPS
50000.0000 [IU] | ORAL_CAPSULE | ORAL | 0 refills | Status: DC
Start: 2020-11-07 — End: 2020-11-28

## 2020-11-16 NOTE — Progress Notes (Signed)
Chief Complaint:   OBESITY Lisa Owens is here to discuss her progress with her obesity treatment plan along with follow-up of her obesity related diagnoses.   Today's visit was #: 35 Starting weight: 218 lbs Starting date: 12/07/2019 Today's weight: 179 lbs Today's date: 11/07/2020 Weight change since last visit: 0 Total lbs lost to date: 39 lbs Body mass index is 31.71 kg/m.  Total weight loss percentage to date: -17.89%  Interim History: Lisa Owens went to [place] with her brother over the past 2 weeks.  She is surprised she did not gain weight.  She has not lost much (1 pound) in 3.5 months now, and she states she is ready to get serious again.  Current Meal Plan: Category 1 Plan and keeping a food journal and adhering to recommended goals of 250-300 calories and 20 grams of protein at breakfast for 85% of the time.  Current Exercise Plan: Stretching for 5-10 minutes 7 times per week.  Assessment/Plan:   Medications Discontinued During This Encounter  Medication Reason   Vitamin D, Ergocalciferol, (DRISDOL) 1.25 MG (50000 UNIT) CAPS capsule Reorder   Meds ordered this encounter  Medications   Vitamin D, Ergocalciferol, (DRISDOL) 1.25 MG (50000 UNIT) CAPS capsule    Sig: Take 1 capsule (50,000 Units total) by mouth every 7 (seven) days.    Dispense:  4 capsule    Refill:  0   1. Essential hypertension Not at goal. Medications: Norvasc 5 mg daily.   Plan: Avoid buying foods that are: processed, frozen, or prepackaged to avoid excess salt. We will watch for signs of hypotension as she continues lifestyle modifications. We will continue to monitor closely alongside her PCP and/or Specialist.  Regular follow up with PCP and specialists was also encouraged.   BP Readings from Last 3 Encounters:  11/07/20 137/83  10/10/20 118/73  09/13/20 115/71   Lab Results  Component Value Date   CREATININE 0.7 02/16/2020   2. Vitamin D deficiency At goal.  She is taking vitamin D  50,000 IU weekly.  Lisa Owens went in for her bone density via Dr. Harrington Challenger, her PCP.  It was within normal limits on 10/12/2020.  She asked me to review the results with her today.  Plan: Continue to take prescription Vitamin D @50 ,000 IU every week as prescribed.  Follow-up for routine testing of Vitamin D, at least 2-3 times per year to avoid over-replacement.  No weight bearing exercises.  Continue calcium rich meal plan.  Lab Results  Component Value Date   VD25OH 52.9 07/31/2020   VD25OH 47.0 03/28/2020   VD25OH 19.0 (L) 12/07/2019   - Refill Vitamin D, Ergocalciferol, (DRISDOL) 1.25 MG (50000 UNIT) CAPS capsule; Take 1 capsule (50,000 Units total) by mouth every 7 (seven) days.  Dispense: 4 capsule; Refill: 0  3. Obesity with current BMI of 31.71  Course: Lisa Owens is currently in the action stage of change. As such, her goal is to continue with weight loss efforts.   Nutrition goals: She has agreed to the Category 1 Plan with breakfast options and keeping a food journal and adhering to recommended goals of 300-400 calories and 30+ grams of protein.   Exercise goals: Older adults should follow the adult guidelines. When older adults cannot meet the adult guidelines, they should be as physically active as their abilities and conditions will allow.  Older adults should do exercises that maintain or improve balance if they are at risk of falling.   Behavioral modification strategies: better snacking  choices, avoiding temptations, and planning for success.  Lisa Owens has agreed to follow-up with our clinic in 2-3 weeks. She was informed of the importance of frequent follow-up visits to maximize her success with intensive lifestyle modifications for her multiple health conditions.   Objective:   Blood pressure 137/83, pulse 73, temperature 98.4 F (36.9 C), height 5\' 3"  (1.6 m), weight 179 lb (81.2 kg), SpO2 97 %. Body mass index is 31.71 kg/m.  General: Cooperative, alert, well developed,  in no acute distress. HEENT: Conjunctivae and lids unremarkable. Cardiovascular: Regular rhythm.  Lungs: Normal work of breathing. Neurologic: No focal deficits.   Lab Results  Component Value Date   CREATININE 0.7 02/16/2020   BUN 20 02/16/2020   NA 142 02/16/2020   K 4.2 02/16/2020   CL 104 02/16/2020   CO2 29 (A) 02/16/2020   Lab Results  Component Value Date   ALT 19 02/16/2020   AST 21 02/16/2020   ALKPHOS 53 02/16/2020   BILITOT 0.5 12/07/2019   Lab Results  Component Value Date   HGBA1C 5.6 07/31/2020   HGBA1C 5.8 (H) 03/28/2020   HGBA1C 5.9 (H) 12/07/2019   Lab Results  Component Value Date   INSULIN 4.9 07/31/2020   INSULIN 8.1 03/28/2020   INSULIN 11.3 12/07/2019   Lab Results  Component Value Date   TSH 2.850 12/07/2019   Lab Results  Component Value Date   CHOL 246 (H) 07/31/2020   HDL 51 07/31/2020   LDLCALC 184 (H) 07/31/2020   TRIG 65 07/31/2020   CHOLHDL 4.8 (H) 07/31/2020   Lab Results  Component Value Date   VD25OH 52.9 07/31/2020   VD25OH 47.0 03/28/2020   VD25OH 19.0 (L) 12/07/2019   Lab Results  Component Value Date   WBC 7.8 02/16/2020   HGB 14.4 02/16/2020   HCT 42 02/16/2020   MCV 87 12/07/2019   PLT 231 02/16/2020   Lab Results  Component Value Date   IRON 70 09/24/2019   TIBC 361 09/24/2019   FERRITIN 25.1 09/24/2019   Obesity Behavioral Intervention:   Approximately 15 minutes were spent on the discussion below.  ASK: We discussed the diagnosis of obesity with Lisa Owens today and Lisa Owens agreed to give [name] permission to discuss obesity behavioral modification therapy today.  ASSESS: Lisa Owens has the diagnosis of obesity and her BMI today is 31.8. Lisa Owens is in the action stage of change.   ADVISE: Lisa Owens was educated on the multiple health risks of obesity as well as the benefit of weight loss to improve her health. She was advised of the need for long term treatment and the importance of lifestyle  modifications to improve her current health and to decrease her risk of future health problems.  AGREE: Multiple dietary modification options and treatment options were discussed and Lisa Owens agreed to follow the recommendations documented in the above note.  ARRANGE: Lisa Owens was educated on the importance of frequent visits to treat obesity as outlined per CMS and USPSTF guidelines and agreed to schedule her next follow up appointment today.  Attestation Statements:   Reviewed by clinician on day of visit: allergies, medications, problem list, medical history, surgical history, family history, social history, and previous encounter notes.  I, Water quality scientist, CMA, am acting as Location manager for [employer], DO.  I have reviewed the above documentation for accuracy and completeness, and I agree with the above. [name], D.O.  The [place] was signed into law in 2016 which includes  the topic of electronic health records.  This provides immediate access to information in MyChart.  This includes consultation notes, operative notes, office notes, lab results and pathology reports.  If you have any questions about what you read please let us know at your next visit so we can discuss your concerns and take corrective action if need be.  We are right here with you.

## 2020-11-28 ENCOUNTER — Ambulatory Visit (INDEPENDENT_AMBULATORY_CARE_PROVIDER_SITE_OTHER): Payer: Medicare HMO | Admitting: Family Medicine

## 2020-11-28 ENCOUNTER — Other Ambulatory Visit: Payer: Self-pay

## 2020-11-28 ENCOUNTER — Encounter (INDEPENDENT_AMBULATORY_CARE_PROVIDER_SITE_OTHER): Payer: Self-pay | Admitting: Family Medicine

## 2020-11-28 VITALS — BP 155/84 | HR 71 | Temp 98.8°F | Ht 63.0 in | Wt 184.0 lb

## 2020-11-28 DIAGNOSIS — E559 Vitamin D deficiency, unspecified: Secondary | ICD-10-CM | POA: Diagnosis not present

## 2020-11-28 DIAGNOSIS — Z6838 Body mass index (BMI) 38.0-38.9, adult: Secondary | ICD-10-CM | POA: Diagnosis not present

## 2020-11-28 MED ORDER — VITAMIN D (ERGOCALCIFEROL) 1.25 MG (50000 UNIT) PO CAPS
50000.0000 [IU] | ORAL_CAPSULE | ORAL | 0 refills | Status: DC
Start: 2020-11-28 — End: 2021-01-10

## 2020-12-07 NOTE — Progress Notes (Signed)
Chief Complaint:   OBESITY Lisa Owens is here to discuss her progress with her obesity treatment plan along with follow-up of her obesity related diagnoses.   Today's visit was #: 20 Starting weight: 218 lbs Starting date: 12/07/2019 Today's weight: 184 lbs Today's date: 11/28/2020 Weight change since last visit: +5 lbs Total lbs lost to date: 34 lbs Body mass index is 32.59 kg/m.  Total weight loss percentage to date: -15.60%  Interim History:  Kaydynce's family is in town and they did a lot of celebration eating.  She is excited that she has no celebrations or distractions in the future until Thanksgiving.  Current Meal Plan: the Category 1 Plan with breakfast options and keeping a food journal and adhering to recommended goals of 300-400 calories and 30 grams of protein for 75% of the time.  Current Exercise Plan: Stretching for 10 minutes 7 times per week.  Assessment/Plan:   Medications Discontinued During This Encounter  Medication Reason   Vitamin D, Ergocalciferol, (DRISDOL) 1.25 MG (50000 UNIT) CAPS capsule Reorder   Meds ordered this encounter  Medications   Vitamin D, Ergocalciferol, (DRISDOL) 1.25 MG (50000 UNIT) CAPS capsule    Sig: Take 1 capsule (50,000 Units total) by mouth every 7 (seven) days.    Dispense:  4 capsule    Refill:  0    1. Vitamin D deficiency At goal.  She is taking vitamin D 50,000 IU weekly.  Plan: Continue to take prescription Vitamin D '@50'$ ,000 IU every week as prescribed.  Follow-up for routine testing of Vitamin D, at least 2-3 times per year to avoid over-replacement.  Lab Results  Component Value Date   VD25OH 52.9 07/31/2020   VD25OH 47.0 03/28/2020   VD25OH 19.0 (L) 12/07/2019   - Refill Vitamin D, Ergocalciferol, (DRISDOL) 1.25 MG (50000 UNIT) CAPS capsule; Take 1 capsule (50,000 Units total) by mouth every 7 (seven) days.  Dispense: 4 capsule; Refill: 0  2. Class 2 severe obesity with serious comorbidity and body mass  index (BMI) of 38.0 to 38.9 in adult, unspecified obesity type ([place])  Course: Ibet is currently in the action stage of change. As such, her goal is to continue with weight loss efforts.   Nutrition goals: She has agreed to the Category 1 Plan and keeping a food journal and adhering to recommended goals of 150-250 calories and 15+ grams of protein at breakfast.   Exercise goals:  As is.  Behavioral modification strategies: meal planning and cooking strategies, keeping healthy foods in the home, and planning for success.  Trenese has agreed to follow-up with our clinic in 3-4 weeks, fasting for A1c, vitamin D, B12, and/or FLP. She was informed of the importance of frequent follow-up visits to maximize her success with intensive lifestyle modifications for her multiple health conditions.   Objective:   Blood pressure (!) 155/84, pulse 71, temperature 98.8 F (37.1 C), height '5\' 3"'$  (1.6 m), weight 184 lb (83.5 kg), SpO2 98 %. Body mass index is 32.59 kg/m.  General: Cooperative, alert, well developed, in no acute distress. HEENT: Conjunctivae and lids unremarkable. Cardiovascular: Regular rhythm.  Lungs: Normal work of breathing. Neurologic: No focal deficits.   Lab Results  Component Value Date   CREATININE 0.7 02/16/2020   BUN 20 02/16/2020   NA 142 02/16/2020   K 4.2 02/16/2020   CL 104 02/16/2020   CO2 29 (A) 02/16/2020   Lab Results  Component Value Date   ALT 19 02/16/2020   AST  21 02/16/2020   ALKPHOS 53 02/16/2020   BILITOT 0.5 12/07/2019   Lab Results  Component Value Date   HGBA1C 5.6 07/31/2020   HGBA1C 5.8 (H) 03/28/2020   HGBA1C 5.9 (H) 12/07/2019   Lab Results  Component Value Date   INSULIN 4.9 07/31/2020   INSULIN 8.1 03/28/2020   INSULIN 11.3 12/07/2019   Lab Results  Component Value Date   TSH 2.850 12/07/2019   Lab Results  Component Value Date   CHOL 246 (H) 07/31/2020   HDL 51 07/31/2020   LDLCALC 184 (H) 07/31/2020   TRIG 65  07/31/2020   CHOLHDL 4.8 (H) 07/31/2020   Lab Results  Component Value Date   VD25OH 52.9 07/31/2020   VD25OH 47.0 03/28/2020   VD25OH 19.0 (L) 12/07/2019   Lab Results  Component Value Date   WBC 7.8 02/16/2020   HGB 14.4 02/16/2020   HCT 42 02/16/2020   MCV 87 12/07/2019   PLT 231 02/16/2020   Lab Results  Component Value Date   IRON 70 09/24/2019   TIBC 361 09/24/2019   FERRITIN 25.1 09/24/2019   Obesity Behavioral Intervention:   Approximately 15 minutes were spent on the discussion below.  ASK: We discussed the diagnosis of obesity with [name] today and Tinya agreed to give [name] permission to discuss obesity behavioral modification therapy today.  ASSESS: Kwana has the diagnosis of obesity and her BMI today is 32.6. Kailene is in the action stage of change.   ADVISE: Vy was educated on the multiple health risks of obesity as well as the benefit of weight loss to improve her health. She was advised of the need for long term treatment and the importance of lifestyle modifications to improve her current health and to decrease her risk of future health problems.  AGREE: Multiple dietary modification options and treatment options were discussed and Kadidja agreed to follow the recommendations documented in the above note.  ARRANGE: Merola was educated on the importance of frequent visits to treat obesity as outlined per CMS and USPSTF guidelines and agreed to schedule her next follow up appointment today.  Attestation Statements:   Reviewed by clinician on day of visit: allergies, medications, problem list, medical history, surgical history, family history, social history, and previous encounter notes.  I, Water quality scientist, CMA, am acting as Location manager for [employer], DO.  I have reviewed the above documentation for accuracy and completeness, and I agree with the above. [name], D.O.  The [place] was signed  into law in 2016 which includes the topic of electronic health records.  This provides immediate access to information in MyChart.  This includes consultation notes, operative notes, office notes, lab results and pathology reports.  If you have any questions about what you read please let us know at your next visit so we can discuss your concerns and take corrective action if need be.  We are right here with you.

## 2020-12-20 ENCOUNTER — Other Ambulatory Visit: Payer: Self-pay

## 2020-12-20 ENCOUNTER — Encounter (INDEPENDENT_AMBULATORY_CARE_PROVIDER_SITE_OTHER): Payer: Self-pay | Admitting: Family Medicine

## 2020-12-20 ENCOUNTER — Ambulatory Visit (INDEPENDENT_AMBULATORY_CARE_PROVIDER_SITE_OTHER): Payer: Medicare HMO | Admitting: Family Medicine

## 2020-12-20 VITALS — BP 122/79 | HR 74 | Temp 98.2°F | Ht 63.0 in | Wt 182.0 lb

## 2020-12-20 DIAGNOSIS — E538 Deficiency of other specified B group vitamins: Secondary | ICD-10-CM

## 2020-12-20 DIAGNOSIS — Z6838 Body mass index (BMI) 38.0-38.9, adult: Secondary | ICD-10-CM | POA: Diagnosis not present

## 2020-12-20 DIAGNOSIS — R7303 Prediabetes: Secondary | ICD-10-CM | POA: Diagnosis not present

## 2020-12-20 DIAGNOSIS — I10 Essential (primary) hypertension: Secondary | ICD-10-CM | POA: Diagnosis not present

## 2020-12-20 DIAGNOSIS — E559 Vitamin D deficiency, unspecified: Secondary | ICD-10-CM | POA: Diagnosis not present

## 2020-12-20 DIAGNOSIS — E7849 Other hyperlipidemia: Secondary | ICD-10-CM

## 2020-12-20 NOTE — Progress Notes (Signed)
Chief Complaint:   OBESITY Lisa Owens is here to discuss her progress with her obesity treatment plan along with follow-up of her obesity related diagnoses. Lisa Owens is on the Category 1 Plan and keeping a food journal and adhering to recommended goals of 150-250 calories and 15+ grams of protein at breakfast daily and states she is following her eating plan approximately 85% of the time. Lisa Owens states she is doing stretches for 15 minutes 7 times per week.  Today's visit was #: 21 Starting weight: 218 lbs Starting date: 12/07/2019 Today's weight: 182 lbs Today's date: 12/20/2020 Total lbs lost to date: 36 Total lbs lost since last in-office visit: 2  Interim History: Lisa Owens came off her carbohydrates and sweets. She still craves them but she finds not bringing them in the house controls her cravings. She denies the need for medications. She is here today for fasting blood work as well.  Subjective:   1. Pre-diabetes Lisa Owens has a diagnosis of prediabetes based on her elevated HgA1c and was informed this puts her at greater risk of developing diabetes. She continues to work on diet and exercise to decrease her risk of diabetes. She denies nausea or hypoglycemia.  2. Other hyperlipidemia Lisa Owens has declined medications in the past many years ago. She states she will consider statins if no improvement.  3. B12 deficiency Lisa Owens is taking OTC 2 tablets q daily, but she is unsure of the dose.  4. Vitamin D deficiency Lisa Owens is currently taking prescription vitamin D 50,000 IU each week. She denies nausea, vomiting or muscle weakness.  Assessment/Plan:   Orders Placed This Encounter  Procedures   VITAMIN D 25 Hydroxy (Vit-D Deficiency, Fractures)   Vitamin B12   Lipid panel   LDL cholesterol, direct   Hemoglobin A1c   Insulin, random    There are no discontinued medications.   No orders of the defined types were placed in this encounter.    1.  Pre-diabetes Lisa Owens will continue to work on weight loss, exercise, and decreasing simple carbohydrates to help decrease the risk of diabetes. We will check labs today.  - Hemoglobin A1c - Insulin, random  2. Other hyperlipidemia Cardiovascular risk and specific lipid/LDL goals reviewed. We discussed several lifestyle modifications today. We will check labs today. Lisa Owens will continue to work on diet, exercise and weight loss efforts. Orders and follow up as documented in patient record.   Counseling Intensive lifestyle modifications are the first line treatment for this issue. Dietary changes: Increase soluble fiber. Decrease simple carbohydrates. Exercise changes: Moderate to vigorous-intensity aerobic activity 150 minutes per week if tolerated. Lipid-lowering medications: see documented in medical record.  - Lipid panel - LDL cholesterol, direct  3. B12 deficiency The diagnosis was reviewed with the patient. Counseling provided today, see below. We will check labs today, and will continue to monitor. Orders and follow up as documented in patient record.  Counseling The body needs vitamin B12: to make red blood cells; to make DNA; and to help the nerves work properly so they can carry messages from the brain to the body.  The main causes of vitamin B12 deficiency include dietary deficiency, digestive diseases, pernicious anemia, and having a surgery in which part of the stomach or small intestine is removed.  Certain medicines can make it harder for the body to absorb vitamin B12. These medicines include: heartburn medications; some antibiotics; some medications used to treat diabetes, gout, and high cholesterol.  In some cases, there are no symptoms  of this condition. If the condition leads to anemia or nerve damage, various symptoms can occur, such as weakness or fatigue, shortness of breath, and numbness or tingling in your hands and feet.   Treatment:  May include taking  vitamin B12 supplements.  Avoid alcohol.  Eat lots of healthy foods that contain vitamin B12: Beef, pork, chicken, [place], and organ meats, such as liver.  Seafood: This includes clams, rainbow trout, salmon, tuna, and haddock. Eggs.  Cereal and dairy products that are fortified: This means that vitamin B12 has been added to the food.   - Vitamin B12  4. Vitamin D deficiency Low Vitamin D level contributes to fatigue and are associated with obesity, breast, and colon cancer. Lisa Owens will check labs today. Lisa Owens will continue prescription Vitamin D 50,000 IU every week. She will follow-up for routine testing of Vitamin D, at least 2-3 times per year to avoid over-replacement.  - VITAMIN D 25 Hydroxy (Vit-D Deficiency, Fractures)  5. Obesity with current BMI 32.3 Lisa Owens is currently in the action stage of change. As such, her goal is to continue with weight loss efforts. She has agreed to the Category 1 Plan.   We will recheck IC at her next office visit, as she denies shortness of breath at all today.  Exercise goals: As is.  Behavioral modification strategies: increasing lean protein intake, decreasing simple carbohydrates, and meal planning and cooking strategies.  Lisa Owens has agreed to follow-up with our clinic in 3 weeks. She was informed of the importance of frequent follow-up visits to maximize her success with intensive lifestyle modifications for her multiple health conditions.   Lisa Owens was informed we would discuss her lab results at her next visit unless there is a critical issue that needs to be addressed sooner. Lisa Owens agreed to keep her next visit at the agreed upon time to discuss these results.  Objective:   Blood pressure 122/79, pulse 74, temperature 98.2 F (36.8 C), height '5\' 3"'$  (1.6 m), weight 182 lb (82.6 kg), SpO2 98 %. Body mass index is 32.24 kg/m.  General: Cooperative, alert, well developed, in no acute distress. HEENT: Conjunctivae and lids  unremarkable. Cardiovascular: Regular rhythm.  Lungs: Normal work of breathing. Neurologic: No focal deficits.   Lab Results  Component Value Date   CREATININE 0.7 02/16/2020   BUN 20 02/16/2020   NA 142 02/16/2020   K 4.2 02/16/2020   CL 104 02/16/2020   CO2 29 (A) 02/16/2020   Lab Results  Component Value Date   ALT 19 02/16/2020   AST 21 02/16/2020   ALKPHOS 53 02/16/2020   BILITOT 0.5 12/07/2019   Lab Results  Component Value Date   HGBA1C 5.6 07/31/2020   HGBA1C 5.8 (H) 03/28/2020   HGBA1C 5.9 (H) 12/07/2019   Lab Results  Component Value Date   INSULIN 4.9 07/31/2020   INSULIN 8.1 03/28/2020   INSULIN 11.3 12/07/2019   Lab Results  Component Value Date   TSH 2.850 12/07/2019   Lab Results  Component Value Date   CHOL 246 (H) 07/31/2020   HDL 51 07/31/2020   LDLCALC 184 (H) 07/31/2020   TRIG 65 07/31/2020   CHOLHDL 4.8 (H) 07/31/2020   Lab Results  Component Value Date   VD25OH 52.9 07/31/2020   VD25OH 47.0 03/28/2020   VD25OH 19.0 (L) 12/07/2019   Lab Results  Component Value Date   WBC 7.8 02/16/2020   HGB 14.4 02/16/2020   HCT 42 02/16/2020   MCV 87 12/07/2019  PLT 231 02/16/2020   Lab Results  Component Value Date   IRON 70 09/24/2019   TIBC 361 09/24/2019   FERRITIN 25.1 09/24/2019    Obesity Behavioral Intervention:   Approximately 15 minutes were spent on the discussion below.  ASK: We discussed the diagnosis of obesity with Lisa Owens today and Lisa Owens agreed to give [name] permission to discuss obesity behavioral modification therapy today.  ASSESS: Lisa Owens has the diagnosis of obesity and her BMI today is 32.25. Lisa Owens is in the action stage of change.   ADVISE: Lisa Owens was educated on the multiple health risks of obesity as well as the benefit of weight loss to improve her health. She was advised of the need for long term treatment and the importance of lifestyle modifications to improve her current health and to  decrease her risk of future health problems.  AGREE: Multiple dietary modification options and treatment options were discussed and Lisa Owens agreed to follow the recommendations documented in the above note.  ARRANGE: Lisa Owens was educated on the importance of frequent visits to treat obesity as outlined per CMS and USPSTF guidelines and agreed to schedule her next follow up appointment today.  Attestation Statements:   Reviewed by clinician on day of visit: allergies, medications, problem list, medical history, surgical history, family history, social history, and previous encounter notes.   Wilhemena Durie, am acting as transcriptionist for [employer], DO.  I have reviewed the above documentation for accuracy and completeness, and I agree with the above. [name], D.O.  The [place] was signed into law in 2016 which includes the topic of electronic health records.  This provides immediate access to information in MyChart.  This includes consultation notes, operative notes, office notes, lab results and pathology reports.  If you have any questions about what you read please let us know at your next visit so we can discuss your concerns and take corrective action if need be.  We are right here with you.

## 2020-12-21 LAB — HEMOGLOBIN A1C
Est. average glucose Bld gHb Est-mCnc: 114 mg/dL
Hgb A1c MFr Bld: 5.6 % (ref 4.8–5.6)

## 2020-12-21 LAB — LIPID PANEL
Chol/HDL Ratio: 4.4 ratio (ref 0.0–4.4)
Cholesterol, Total: 260 mg/dL — ABNORMAL HIGH (ref 100–199)
HDL: 59 mg/dL (ref >39)
LDL Chol Calc (NIH): 191 mg/dL — ABNORMAL HIGH (ref 0–99)
Triglycerides: 65 mg/dL (ref 0–149)
VLDL Cholesterol Cal: 10 mg/dL (ref 5–40)

## 2020-12-21 LAB — LDL CHOLESTEROL, DIRECT: LDL Direct: 182 mg/dL — ABNORMAL HIGH (ref 0–99)

## 2020-12-21 LAB — INSULIN, RANDOM: INSULIN: 9 u[IU]/mL (ref 2.6–24.9)

## 2020-12-21 LAB — VITAMIN B12: Vitamin B-12: >2000 pg/mL — ABNORMAL HIGH (ref 232–1245)

## 2020-12-21 LAB — VITAMIN D 25 HYDROXY (VIT D DEFICIENCY, FRACTURES): Vit D, 25-Hydroxy: 55.7 ng/mL (ref 30.0–100.0)

## 2021-01-10 ENCOUNTER — Other Ambulatory Visit: Payer: Self-pay

## 2021-01-10 ENCOUNTER — Ambulatory Visit (INDEPENDENT_AMBULATORY_CARE_PROVIDER_SITE_OTHER): Payer: Medicare HMO | Admitting: Family Medicine

## 2021-01-10 ENCOUNTER — Encounter (INDEPENDENT_AMBULATORY_CARE_PROVIDER_SITE_OTHER): Payer: Self-pay | Admitting: Family Medicine

## 2021-01-10 VITALS — BP 121/75 | HR 73 | Temp 98.1°F | Ht 63.0 in | Wt 183.0 lb

## 2021-01-10 DIAGNOSIS — E559 Vitamin D deficiency, unspecified: Secondary | ICD-10-CM

## 2021-01-10 DIAGNOSIS — K5909 Other constipation: Secondary | ICD-10-CM

## 2021-01-10 DIAGNOSIS — E7849 Other hyperlipidemia: Secondary | ICD-10-CM

## 2021-01-10 DIAGNOSIS — R7303 Prediabetes: Secondary | ICD-10-CM

## 2021-01-10 DIAGNOSIS — Z6832 Body mass index (BMI) 32.0-32.9, adult: Secondary | ICD-10-CM

## 2021-01-10 DIAGNOSIS — E538 Deficiency of other specified B group vitamins: Secondary | ICD-10-CM | POA: Diagnosis not present

## 2021-01-10 DIAGNOSIS — Z6838 Body mass index (BMI) 38.0-38.9, adult: Secondary | ICD-10-CM

## 2021-01-10 DIAGNOSIS — E669 Obesity, unspecified: Secondary | ICD-10-CM

## 2021-01-10 MED ORDER — VITAMIN D (ERGOCALCIFEROL) 1.25 MG (50000 UNIT) PO CAPS
50000.0000 [IU] | ORAL_CAPSULE | ORAL | 0 refills | Status: DC
Start: 2021-01-10 — End: 2021-01-29

## 2021-01-10 MED ORDER — B-12 500 MCG PO TABS
500.0000 ug | ORAL_TABLET | Freq: Every day | ORAL | Status: AC
Start: 2021-01-10 — End: ?

## 2021-01-16 NOTE — Progress Notes (Signed)
Chief Complaint:   OBESITY Lisa Owens is here to discuss her progress with her obesity treatment plan along with follow-up of her obesity related diagnoses. Lisa Owens is on the Category 1 Plan and states she is following her eating plan approximately 85% of the time. Lisa Owens states she is doing 0 minutes 0 times per week.  Today's visit was #: 22 Starting weight: 218 lbs Starting date: 12/07/2019 Today's weight: 183 lbs Today's date: 01/10/2021 Total lbs lost to date: 35 Total lbs lost since last in-office visit: 0  Interim History: Lisa Owens is here for a follow up office visit.  We reviewed her meal plan and questions were answered.  Patient's food recall appears to be accurate and consistent with what is on plan when she is following it. When eating on plan, her hunger and cravings are well controlled. She made [place] food or pasta and ate out a couple of times after 2-3 weeks. She denies issues or concerns with meal plan. She is here to review all recent labs as well as discuss treatments and refill medications.    Subjective:   1. Vitamin D deficiency Lisa Owens is currently taking prescription vitamin D 50,000 IU each week. She denies nausea, vomiting or muscle weakness. I discussed labs with the patient today.  2. Other hyperlipidemia Lisa Owens has hyperlipidemia and has been trying to improve her cholesterol levels with intensive lifestyle modification including a low saturated fat diet, exercise and weight loss. She is not on medications, and denies any chest pain, claudication or myalgias. I discussed labs with the patient today.  3. B12 deficiency Lisa Owens does not have a previous diagnosis of pernicious anemia. She has a history of weight loss surgery. I discussed labs with the patient today.  4. Pre-diabetes Lisa Owens's fasting insulin is increasing from prior, but her A1c has not changed from prior. I discussed labs with the patient today.   Assessment/Plan:   No orders of the defined types were placed in this encounter.   Medications Discontinued During This Encounter  Medication Reason   Vitamin D, Ergocalciferol, (DRISDOL) 1.25 MG (50000 UNIT) CAPS capsule Reorder     Meds ordered this encounter  Medications   Vitamin D, Ergocalciferol, (DRISDOL) 1.25 MG (50000 UNIT) CAPS capsule    Sig: Take 1 capsule (50,000 Units total) by mouth every 7 (seven) days.    Dispense:  4 capsule    Refill:  0    Ov for rf; 30 d supply   Cyanocobalamin (VITAMIN B-12) 500 MCG TABS    Sig: Take 500 mcg by mouth daily. Dec intake from 5,039mg qd to 5046m qd     1. Vitamin D deficiency Lisa Owens Vit D level is at goal. We will refill prescription Vit D for 1 month.  - Discussed importance of vitamin D to their health and well-being.  - possible symptoms of low Vitamin D can be low energy, depressed mood, muscle aches, joint aches, osteoporosis etc. - low Vitamin D levels may be linked to an increased risk of cardiovascular events and even increased risk of cancers- such as colon and breast.  - I recommend pt take a 50,000 IU weekly prescription vit D - see script below   - Informed patient this may be a lifelong thing, and she was encouraged to continue to take the medicine until told otherwise.   - we will need to monitor levels regularly (every 3-4 mo on average) to keep levels within normal limits.  - weight loss will  likely improve availability of vitamin D, thus encouraged Lisa Owens to continue with meal plan and their weight loss efforts to further improve this condition - pt's questions and concerns regarding this condition addressed.  - Vitamin D, Ergocalciferol, (DRISDOL) 1.25 MG (50000 UNIT) CAPS capsule; Take 1 capsule (50,000 Units total) by mouth every 7 (seven) days.  Dispense: 4 capsule; Refill: 0  2. Other hyperlipidemia Lisa Owens's level is not at goal. She declines statin again and I had a long discussion with her about increased risk of  AMI and CVA. I recommended that she have a discussion with her primary care physician and entertain a possible coronary calcium CT and or referral to Cardiology to discuss her risk and evaluate risks further.   Lisa Owens reports compliance with meds and/or treatment plan such as low saturated and trans fat low cholesterol meal plan  - Rec: aerobic activity with eventual goal of a minimum of 150+ min wk plus 2 days/ week of resistance strength training  - Cardiovascular risk and specific lipid/LDL goals reviewed.  We discussed several lifestyle modifications today and Lisa Owens will continue to work on diet, exercise and weight loss efforts.   - Will continue routine screening as patient continues with health goals and weight loss journey  Last lipid panel as follows:  Lab Results  Component Value Date   CHOL 260 (H) 12/20/2020   HDL 59 12/20/2020   LDLCALC 191 (H) 12/20/2020   LDLDIRECT 182 (H) 12/20/2020   TRIG 65 12/20/2020   CHOLHDL 4.4 12/20/2020    Lab Results  Component Value Date   ALT 19 02/16/2020    The ASCVD Risk score Mikey Bussing DC Jr., et al., 2013) failed to calculate for the following reasons:   The 2013 ASCVD risk score is only valid for ages 20 to 43  3. B12 deficiency The diagnosis was reviewed with the patient. Counseling provided today, see below. We will continue to monitor. Lisa Owens agreed to decrease from 5,000 mcg q daily to 500 mcg q daily. Orders and follow up as documented in patient record.  Counseling The body needs vitamin B12: to make red blood cells; to make DNA; and to help the nerves work properly so they can carry messages from the brain to the body.  The main causes of vitamin B12 deficiency include dietary deficiency, digestive diseases, pernicious anemia, and having a surgery in which part of the stomach or small intestine is removed.  Certain medicines can make it harder for the body to absorb vitamin B12. These medicines include: heartburn  medications; some antibiotics; some medications used to treat diabetes, gout, and high cholesterol.  In some cases, there are no symptoms of this condition. If the condition leads to anemia or nerve damage, various symptoms can occur, such as weakness or fatigue, shortness of breath, and numbness or tingling in your hands and feet.   Treatment:  May include taking vitamin B12 supplements.  Avoid alcohol.  Eat lots of healthy foods that contain vitamin B12: Beef, pork, chicken, [place], and organ meats, such as liver.  Seafood: This includes clams, rainbow trout, salmon, tuna, and haddock. Eggs.  Cereal and dairy products that are fortified: This means that vitamin B12 has been added to the food.   - Cyanocobalamin (VITAMIN B-12) 500 MCG TABS; Take 500 mcg by mouth daily. Dec intake from 5,075mg qd to 5033m qd  4. Pre-diabetes JeEllyetteas good control still, and will continue to decrease simple carbohydrates and increase protein by following her prudent  nutritional plan.  - I reiterated and again counseled patient on pathophysiology of the disease process of Pre-DM.   - Stressed importance of dietary and lifestyle modifications resulting in weight loss as first line txmnt - in addition we discussed the risks and benefits of various medication options which can help [name] in the management of this disease process as well as with weight loss.  Will consider starting one of these meds in future as will focus on prudent nutritional plan at this time.  - continue to decrease simple carbs; increase fiber and proteins -> follow meal plan  - handouts provided at pt's request after education provided.  All concerns/questions addressed.   - anticipatory guidance given.   - Recheck A1c and fasting insulin level in approximately 3 months from last check or as deemed fit.   5. Obesity with current BMI of 32.4 Lisa Owens is currently in the action stage of change. As such, her goal is to continue with weight  loss efforts. She has agreed to the Category 1 Plan.   Exercise goals: All adults should avoid inactivity. Some physical activity is better than none, and adults who participate in any amount of physical activity gain some health benefits.  Behavioral modification strategies: increasing lean protein intake, decreasing simple carbohydrates, decrease saturated and trans fats, and meal planning and cooking strategies.  Sady has agreed to follow-up with our clinic in 2 to 3 weeks. She was informed of the importance of frequent follow-up visits to maximize her success with intensive lifestyle modifications for her multiple health conditions.   Objective:   Blood pressure 121/75, pulse 73, temperature 98.1 F (36.7 C), height '5\' 3"'$  (1.6 m), weight 183 lb (83 kg), SpO2 96 %. Body mass index is 32.42 kg/m.  General: Cooperative, alert, well developed, in no acute distress. HEENT: Conjunctivae and lids unremarkable. Cardiovascular: Regular rhythm.  Lungs: Normal work of breathing. Neurologic: No focal deficits.   Lab Results  Component Value Date   CREATININE 0.7 02/16/2020   BUN 20 02/16/2020   NA 142 02/16/2020   K 4.2 02/16/2020   CL 104 02/16/2020   CO2 29 (A) 02/16/2020   Lab Results  Component Value Date   ALT 19 02/16/2020   AST 21 02/16/2020   ALKPHOS 53 02/16/2020   BILITOT 0.5 12/07/2019   Lab Results  Component Value Date   HGBA1C 5.6 12/20/2020   HGBA1C 5.6 07/31/2020   HGBA1C 5.8 (H) 03/28/2020   HGBA1C 5.9 (H) 12/07/2019   Lab Results  Component Value Date   INSULIN 9.0 12/20/2020   INSULIN 4.9 07/31/2020   INSULIN 8.1 03/28/2020   INSULIN 11.3 12/07/2019   Lab Results  Component Value Date   TSH 2.850 12/07/2019   Lab Results  Component Value Date   CHOL 260 (H) 12/20/2020   HDL 59 12/20/2020   LDLCALC 191 (H) 12/20/2020   LDLDIRECT 182 (H) 12/20/2020   TRIG 65 12/20/2020   CHOLHDL 4.4 12/20/2020   Lab Results  Component Value Date   VD25OH  55.7 12/20/2020   VD25OH 52.9 07/31/2020   VD25OH 47.0 03/28/2020   Lab Results  Component Value Date   WBC 7.8 02/16/2020   HGB 14.4 02/16/2020   HCT 42 02/16/2020   MCV 87 12/07/2019   PLT 231 02/16/2020   Lab Results  Component Value Date   IRON 70 09/24/2019   TIBC 361 09/24/2019   FERRITIN 25.1 09/24/2019    Obesity Behavioral Intervention:   Approximately 15 minutes  were spent on the discussion below.  ASK: We discussed the diagnosis of obesity with [name] today and Marlana agreed to give [name] permission to discuss obesity behavioral modification therapy today.  ASSESS: Afton has the diagnosis of obesity and her BMI today is 32.4. Deeanna is in the action stage of change.   ADVISE: Nusaiba was educated on the multiple health risks of obesity as well as the benefit of weight loss to improve her health. She was advised of the need for long term treatment and the importance of lifestyle modifications to improve her current health and to decrease her risk of future health problems.  AGREE: Multiple dietary modification options and treatment options were discussed and Vickye agreed to follow the recommendations documented in the above note.  ARRANGE: Jai was educated on the importance of frequent visits to treat obesity as outlined per CMS and USPSTF guidelines and agreed to schedule her next follow up appointment today.  Attestation Statements:   Reviewed by clinician on day of visit: allergies, medications, problem list, medical history, surgical history, family history, social history, and previous encounter notes.   Wilhemena Durie, am acting as transcriptionist for [employer], DO.  I have reviewed the above documentation for accuracy and completeness, and I agree with the above. [name], D.O.  The [place] was signed into law in 2016 which includes the topic of electronic health records.  This provides immediate  access to information in MyChart.  This includes consultation notes, operative notes, office notes, lab results and pathology reports.  If you have any questions about what you read please let us know at your next visit so we can discuss your concerns and take corrective action if need be.  We are right here with you.

## 2021-01-29 ENCOUNTER — Ambulatory Visit (INDEPENDENT_AMBULATORY_CARE_PROVIDER_SITE_OTHER): Payer: Medicare HMO | Admitting: Family Medicine

## 2021-01-29 ENCOUNTER — Encounter (INDEPENDENT_AMBULATORY_CARE_PROVIDER_SITE_OTHER): Payer: Self-pay | Admitting: Family Medicine

## 2021-01-29 ENCOUNTER — Other Ambulatory Visit: Payer: Self-pay

## 2021-01-29 VITALS — BP 123/85 | HR 70 | Temp 98.1°F | Ht 63.0 in | Wt 183.0 lb

## 2021-01-29 DIAGNOSIS — Z6838 Body mass index (BMI) 38.0-38.9, adult: Secondary | ICD-10-CM

## 2021-01-29 DIAGNOSIS — E559 Vitamin D deficiency, unspecified: Secondary | ICD-10-CM | POA: Diagnosis not present

## 2021-01-29 DIAGNOSIS — E538 Deficiency of other specified B group vitamins: Secondary | ICD-10-CM | POA: Diagnosis not present

## 2021-01-29 MED ORDER — VITAMIN D (ERGOCALCIFEROL) 1.25 MG (50000 UNIT) PO CAPS
50000.0000 [IU] | ORAL_CAPSULE | ORAL | 0 refills | Status: DC
Start: 2021-01-29 — End: 2021-03-21

## 2021-01-30 ENCOUNTER — Telehealth (INDEPENDENT_AMBULATORY_CARE_PROVIDER_SITE_OTHER): Payer: Self-pay

## 2021-01-30 NOTE — Telephone Encounter (Signed)
This patient was seen yesterday and I tried to schedule her an IC per Dr. Hershal Coria request and she declined.  I spoke to Dr. Raliegh Scarlet about it and she asked me to call the patient back and schedule her one for 02/26/21.  I called the patient and left a message with her spouse to have her call me back and she didn't so I called her again this morning and left a message on her machine.  She called me back and left me a message and stated that she was not scheduling the IC at this time, but she will keep her appointment for 10/04.  Noting the patient declined the IC again.

## 2021-01-30 NOTE — Progress Notes (Signed)
Chief Complaint:   OBESITY Lisa Owens is here to discuss her progress with her obesity treatment plan along with follow-up of her obesity related diagnoses. Lisa Owens is on the Category 1 Plan and states she is following her eating plan approximately 85% of the time. Lisa Owens states she is doing stretching exercises for 15 minutes 7 times per week.  Today's visit was #: 23 Starting weight: 218 lbs Starting date: 12/07/2019 Today's weight: 183 lbs Today's date: 01/29/2021 Total lbs lost to date: 35 Total lbs lost since last in-office visit: 0  Interim History: Lisa Owens states she has been following the plan like usual, but she is not losing and she is frustrated.  Subjective:   1. Vitamin D deficiency Lisa Owens is currently taking prescription vitamin D 50,000 IU each week. She denies nausea, vomiting or muscle weakness.  2. B12 deficiency Lisa Owens has gotten her B12, but she [name] not sure of the dose.  Assessment/Plan:  No orders of the defined types were placed in this encounter.   Medications Discontinued During This Encounter  Medication Reason   Vitamin D, Ergocalciferol, (DRISDOL) 1.25 MG (50000 UNIT) CAPS capsule Reorder     Meds ordered this encounter  Medications   Vitamin D, Ergocalciferol, (DRISDOL) 1.25 MG (50000 UNIT) CAPS capsule    Sig: Take 1 capsule (50,000 Units total) by mouth every 7 (seven) days.    Dispense:  4 capsule    Refill:  0    Ov for rf; 30 d supply     1. Vitamin D deficiency Low Vitamin D level contributes to fatigue and are associated with obesity, breast, and colon cancer. We will refill prescription Vitamin D for 1 month. Lisa Owens will follow-up for routine testing of Vitamin D, at least 2-3 times per year to avoid over-replacement.  - Vitamin D, Ergocalciferol, (DRISDOL) 1.25 MG (50000 UNIT) CAPS capsule; Take 1 capsule (50,000 Units total) by mouth every 7 (seven) days.  Dispense: 4 capsule; Refill: 0  2. B12 deficiency The  diagnosis was reviewed with the patient. Counseling provided today, see below. We will continue to monitor. Lisa Owens will bring in her bottle to her next office visit, so that we can help her with the dosing if she still has questions. Orders and follow up as documented in patient record.  Counseling The body needs vitamin B12: to make red blood cells; to make DNA; and to help the nerves work properly so they can carry messages from the brain to the body.  The main causes of vitamin B12 deficiency include dietary deficiency, digestive diseases, pernicious anemia, and having a surgery in which part of the stomach or small intestine is removed.  Certain medicines can make it harder for the body to absorb vitamin B12. These medicines include: heartburn medications; some antibiotics; some medications used to treat diabetes, gout, and high cholesterol.  In some cases, there are no symptoms of this condition. If the condition leads to anemia or nerve damage, various symptoms can occur, such as weakness or fatigue, shortness of breath, and numbness or tingling in your hands and feet.   Treatment:  May include taking vitamin B12 supplements.  Avoid alcohol.  Eat lots of healthy foods that contain vitamin B12: Beef, pork, chicken, [place], and organ meats, such as liver.  Seafood: This includes clams, rainbow trout, salmon, tuna, and haddock. Eggs.  Cereal and dairy products that are fortified: This means that vitamin B12 has been added to the food.    3. Obesity with  current BMI of 32.5 Lisa Owens is currently in the action stage of change. As such, her goal is to continue with weight loss efforts. She has agreed to the Category 1 Plan.   We let Lisa Owens know that IC cost is $189.00 and she will think about it for her next office visit. She is not sure if she wants to do it although I recommended it.  Exercise goals: All adults should avoid inactivity. Some physical activity is better than none, and  adults who participate in any amount of physical activity gain some health benefits.  Behavioral modification strategies: meal planning and cooking strategies and planning for success.  Lisa Owens has agreed to follow-up with our clinic in 2 to 3 weeks. She was informed of the importance of frequent follow-up visits to maximize her success with intensive lifestyle modifications for her multiple health conditions.   Objective:   Blood pressure 123/85, pulse 70, temperature 98.1 F (36.7 C), height 5\' 3"  (1.6 m), weight 183 lb (83 kg), SpO2 97 %. Body mass index is 32.42 kg/m.  General: Cooperative, alert, well developed, in no acute distress. HEENT: Conjunctivae and lids unremarkable. Cardiovascular: Regular rhythm.  Lungs: Normal work of breathing. Neurologic: No focal deficits.   Lab Results  Component Value Date   CREATININE 0.7 02/16/2020   BUN 20 02/16/2020   NA 142 02/16/2020   K 4.2 02/16/2020   CL 104 02/16/2020   CO2 29 (A) 02/16/2020   Lab Results  Component Value Date   ALT 19 02/16/2020   AST 21 02/16/2020   ALKPHOS 53 02/16/2020   BILITOT 0.5 12/07/2019   Lab Results  Component Value Date   HGBA1C 5.6 12/20/2020   HGBA1C 5.6 07/31/2020   HGBA1C 5.8 (H) 03/28/2020   HGBA1C 5.9 (H) 12/07/2019   Lab Results  Component Value Date   INSULIN 9.0 12/20/2020   INSULIN 4.9 07/31/2020   INSULIN 8.1 03/28/2020   INSULIN 11.3 12/07/2019   Lab Results  Component Value Date   TSH 2.850 12/07/2019   Lab Results  Component Value Date   CHOL 260 (H) 12/20/2020   HDL 59 12/20/2020   LDLCALC 191 (H) 12/20/2020   LDLDIRECT 182 (H) 12/20/2020   TRIG 65 12/20/2020   CHOLHDL 4.4 12/20/2020   Lab Results  Component Value Date   VD25OH 55.7 12/20/2020   VD25OH 52.9 07/31/2020   VD25OH 47.0 03/28/2020   Lab Results  Component Value Date   WBC 7.8 02/16/2020   HGB 14.4 02/16/2020   HCT 42 02/16/2020   MCV 87 12/07/2019   PLT 231 02/16/2020   Lab Results   Component Value Date   IRON 70 09/24/2019   TIBC 361 09/24/2019   FERRITIN 25.1 09/24/2019    Obesity Behavioral Intervention:   Approximately 15 minutes were spent on the discussion below.  ASK: We discussed the diagnosis of obesity with Lisa Owens today and Lisa Owens agreed to give [name] permission to discuss obesity behavioral modification therapy today.  ASSESS: Lisa Owens has the diagnosis of obesity and her BMI today is 32.5. Lisa Owens is in the action stage of change.   ADVISE: Lisa Owens was educated on the multiple health risks of obesity as well as the benefit of weight loss to improve her health. She was advised of the need for long term treatment and the importance of lifestyle modifications to improve her current health and to decrease her risk of future health problems.  AGREE: Multiple dietary modification options and treatment options were discussed and  Lisa Owens agreed to follow the recommendations documented in the above note.  ARRANGE: Lisa Owens was educated on the importance of frequent visits to treat obesity as outlined per CMS and USPSTF guidelines and agreed to schedule her next follow up appointment today.  Attestation Statements:   Reviewed by clinician on day of visit: allergies, medications, problem list, medical history, surgical history, family history, social history, and previous encounter notes.   Wilhemena Durie, am acting as transcriptionist for [employer], DO.  I have reviewed the above documentation for accuracy and completeness, and I agree with the above. [name], D.O.  The [place] was signed into law in 2016 which includes the topic of electronic health records.  This provides immediate access to information in MyChart.  This includes consultation notes, operative notes, office notes, lab results and pathology reports.  If you have any questions about what you read please let us know at your next visit so we can  discuss your concerns and take corrective action if need be.  We are right here with you.

## 2021-02-13 ENCOUNTER — Other Ambulatory Visit: Payer: Self-pay

## 2021-02-13 ENCOUNTER — Encounter (INDEPENDENT_AMBULATORY_CARE_PROVIDER_SITE_OTHER): Payer: Self-pay | Admitting: Family Medicine

## 2021-02-13 ENCOUNTER — Ambulatory Visit (INDEPENDENT_AMBULATORY_CARE_PROVIDER_SITE_OTHER): Payer: Medicare HMO | Admitting: Family Medicine

## 2021-02-13 VITALS — BP 133/80 | HR 56 | Temp 98.1°F | Ht 63.0 in | Wt 183.0 lb

## 2021-02-13 DIAGNOSIS — I10 Essential (primary) hypertension: Secondary | ICD-10-CM | POA: Diagnosis not present

## 2021-02-13 DIAGNOSIS — E559 Vitamin D deficiency, unspecified: Secondary | ICD-10-CM | POA: Diagnosis not present

## 2021-02-13 DIAGNOSIS — Z6838 Body mass index (BMI) 38.0-38.9, adult: Secondary | ICD-10-CM

## 2021-02-14 NOTE — Progress Notes (Signed)
Chief Complaint:   OBESITY Lisa Owens is here to discuss her progress with her obesity treatment plan along with follow-up of her obesity related diagnoses. Lisa Owens is on the Category 1 Plan with breakfast options and states she is following her eating plan approximately 65% of the time. Lisa Owens states she is doing 0 minutes 0 times per week.  Today's visit was #: 24 Starting weight: 218 lbs Starting date: 12/07/2019 Today's weight: 183 lbs Today's date: 02/13/2021 Total lbs lost to date: 35 lbs Total lbs lost since last in-office visit: 0  Interim History: Lisa Owens is not eating right. She is redoing her kitchen and she is unable to cook etc.  Subjective:   1. Vitamin D deficiency Lisa Owens is currently taking prescription vitamin D 50,000 IU each week. She denies nausea, vomiting or muscle weakness.  2. Essential hypertension Review: taking medications as instructed, no medication side effects noted, no chest pain on exertion, no dyspnea on exertion, no swelling of ankles.   Assessment/Plan:  No orders of the defined types were placed in this encounter.   There are no discontinued medications.   No orders of the defined types were placed in this encounter.    1. Vitamin D deficiency Low Vitamin D level contributes to fatigue and are associated with obesity, breast, and colon cancer. Lisa Owens agrees to continue to take prescription Vitamin D 50,000 IU every week and she will follow-up for routine testing of Vitamin D, at least 2-3 times per year to avoid over-replacement.  2. Essential hypertension Lisa Owens's blood pressure is at goal, and will continue Norvasc. She working on healthy weight loss and exercise to improve blood pressure control. We will watch for signs of hypotension as she continues her lifestyle modifications.  3. Obesity with current BMI of 32.4 Lisa Owens is currently in the action stage of change. As such, her goal is to continue with weight loss  efforts. She has agreed to the Category 1 Plan with breakfast options.   Lisa Owens declines IC repeat again. She states she will wait until after Christmas and reassess then. She denies shortness of breath at all.  Exercise goals: Older adults should follow the adult guidelines. When older adults cannot meet the adult guidelines, they should be as physically active as their abilities and conditions will allow.   Behavioral modification strategies: planning for success.  Lisa Owens has agreed to follow-up with our clinic in 3-4 weeks. She was informed of the importance of frequent follow-up visits to maximize her success with intensive lifestyle modifications for her multiple health conditions.   Objective:   Blood pressure 133/80, pulse (!) 56, temperature 98.1 F (36.7 C), height 5\' 3"  (1.6 m), weight 183 lb (83 kg), SpO2 92 %. Body mass index is 32.42 kg/m.  General: Cooperative, alert, well developed, in no acute distress. HEENT: Conjunctivae and lids unremarkable. Cardiovascular: Regular rhythm.  Lungs: Normal work of breathing. Neurologic: No focal deficits.   Lab Results  Component Value Date   CREATININE 0.7 02/16/2020   BUN 20 02/16/2020   NA 142 02/16/2020   K 4.2 02/16/2020   CL 104 02/16/2020   CO2 29 (A) 02/16/2020   Lab Results  Component Value Date   ALT 19 02/16/2020   AST 21 02/16/2020   ALKPHOS 53 02/16/2020   BILITOT 0.5 12/07/2019   Lab Results  Component Value Date   HGBA1C 5.6 12/20/2020   HGBA1C 5.6 07/31/2020   HGBA1C 5.8 (H) 03/28/2020   HGBA1C 5.9 (H) 12/07/2019  Lab Results  Component Value Date   INSULIN 9.0 12/20/2020   INSULIN 4.9 07/31/2020   INSULIN 8.1 03/28/2020   INSULIN 11.3 12/07/2019   Lab Results  Component Value Date   TSH 2.850 12/07/2019   Lab Results  Component Value Date   CHOL 260 (H) 12/20/2020   HDL 59 12/20/2020   LDLCALC 191 (H) 12/20/2020   LDLDIRECT 182 (H) 12/20/2020   TRIG 65 12/20/2020   CHOLHDL 4.4  12/20/2020   Lab Results  Component Value Date   VD25OH 55.7 12/20/2020   VD25OH 52.9 07/31/2020   VD25OH 47.0 03/28/2020   Lab Results  Component Value Date   WBC 7.8 02/16/2020   HGB 14.4 02/16/2020   HCT 42 02/16/2020   MCV 87 12/07/2019   PLT 231 02/16/2020   Lab Results  Component Value Date   IRON 70 09/24/2019   TIBC 361 09/24/2019   FERRITIN 25.1 09/24/2019    Obesity Behavioral Intervention:   Approximately 15 minutes were spent on the discussion below.  ASK: We discussed the diagnosis of obesity with Lisa Owens today and Lisa Owens agreed to give [name] permission to discuss obesity behavioral modification therapy today.  ASSESS: Lisa Owens has the diagnosis of obesity and her BMI today is 32.4. Lisa Owens is in the action stage of change.   ADVISE: Lisa Owens was educated on the multiple health risks of obesity as well as the benefit of weight loss to improve her health. She was advised of the need for long term treatment and the importance of lifestyle modifications to improve her current health and to decrease her risk of future health problems.  AGREE: Multiple dietary modification options and treatment options were discussed and Lisa Owens agreed to follow the recommendations documented in the above note.  ARRANGE: Lisa Owens was educated on the importance of frequent visits to treat obesity as outlined per CMS and USPSTF guidelines and agreed to schedule her next follow up appointment today.  Attestation Statements:   Reviewed by clinician on day of visit: allergies, medications, problem list, medical history, surgical history, family history, social history, and previous encounter notes.  I, Lisa Owens, RMA, am acting as Location manager for [employer], DO.   I have reviewed the above documentation for accuracy and completeness, and I agree with the above. Lisa Owens, D.O.  The [place] was signed into law in 2016 which includes  the topic of electronic health records.  This provides immediate access to information in MyChart.  This includes consultation notes, operative notes, office notes, lab results and pathology reports.  If you have any questions about what you read please let us know at your next visit so we can discuss your concerns and take corrective action if need be.  We are right here with you.

## 2021-02-20 DIAGNOSIS — Z23 Encounter for immunization: Secondary | ICD-10-CM | POA: Diagnosis not present

## 2021-02-20 DIAGNOSIS — Z0000 Encounter for general adult medical examination without abnormal findings: Secondary | ICD-10-CM | POA: Diagnosis not present

## 2021-02-20 DIAGNOSIS — E559 Vitamin D deficiency, unspecified: Secondary | ICD-10-CM | POA: Diagnosis not present

## 2021-02-20 DIAGNOSIS — E782 Mixed hyperlipidemia: Secondary | ICD-10-CM | POA: Diagnosis not present

## 2021-02-20 DIAGNOSIS — Z86718 Personal history of other venous thrombosis and embolism: Secondary | ICD-10-CM | POA: Diagnosis not present

## 2021-02-20 DIAGNOSIS — D6869 Other thrombophilia: Secondary | ICD-10-CM | POA: Diagnosis not present

## 2021-02-20 DIAGNOSIS — I700 Atherosclerosis of aorta: Secondary | ICD-10-CM | POA: Diagnosis not present

## 2021-02-20 DIAGNOSIS — I10 Essential (primary) hypertension: Secondary | ICD-10-CM | POA: Diagnosis not present

## 2021-03-07 ENCOUNTER — Ambulatory Visit (INDEPENDENT_AMBULATORY_CARE_PROVIDER_SITE_OTHER): Payer: Medicare HMO | Admitting: Family Medicine

## 2021-03-21 ENCOUNTER — Ambulatory Visit (INDEPENDENT_AMBULATORY_CARE_PROVIDER_SITE_OTHER): Payer: Medicare HMO | Admitting: Family Medicine

## 2021-03-21 ENCOUNTER — Encounter (INDEPENDENT_AMBULATORY_CARE_PROVIDER_SITE_OTHER): Payer: Self-pay | Admitting: Family Medicine

## 2021-03-21 ENCOUNTER — Other Ambulatory Visit: Payer: Self-pay

## 2021-03-21 VITALS — BP 121/79 | HR 73 | Temp 98.0°F | Ht 63.0 in | Wt 186.0 lb

## 2021-03-21 DIAGNOSIS — Z6838 Body mass index (BMI) 38.0-38.9, adult: Secondary | ICD-10-CM

## 2021-03-21 DIAGNOSIS — E559 Vitamin D deficiency, unspecified: Secondary | ICD-10-CM

## 2021-03-21 MED ORDER — VITAMIN D (ERGOCALCIFEROL) 1.25 MG (50000 UNIT) PO CAPS: 50000.0000 [IU] | ORAL_CAPSULE | ORAL | 0 refills | Status: DC

## 2021-03-22 NOTE — Progress Notes (Signed)
Chief Complaint:   OBESITY Lisa Owens is here to discuss her progress with her obesity treatment plan along with follow-up of her obesity related diagnoses. Lisa Owens is on the Category 1 Plan with breakfast options and states she is following her eating plan approximately 75% of the time. Lisa Owens states she is doing 0 minutes 0 times per week.  Today's visit was #: 25 Starting weight: 218 lbs Starting date: 12/07/2019 Today's weight: 186 lbs Today's date: 03/21/2021 Total lbs lost to date: 32 Total lbs lost since last in-office visit: 0  Interim History: Lisa Owens is tired of food, and tired of the same meal plan. She has a very busy holiday season coming up, and she states she "needs a break"!! She is going on a cruise December 7th-16th.  Subjective:   1. Vitamin D deficiency Lisa Owens is currently taking prescription vitamin D 50,000 IU each week. She denies nausea, vomiting or muscle weakness.  Assessment/Plan:  No orders of the defined types were placed in this encounter.   Medications Discontinued During This Encounter  Medication Reason   Vitamin D, Ergocalciferol, (DRISDOL) 1.25 MG (50000 UNIT) CAPS capsule Reorder     Meds ordered this encounter  Medications   Vitamin D, Ergocalciferol, (DRISDOL) 1.25 MG (50000 UNIT) CAPS capsule    Sig: Take 1 capsule (50,000 Units total) by mouth every 7 (seven) days.    Dispense:  12 capsule    Refill:  0    Ov for rf     1. Vitamin D deficiency Low Vitamin D level contributes to fatigue and are associated with obesity, breast, and colon cancer. We will refill prescription Vitamin D 50,000 IU every week for 90 days per patient's request. Lisa Owens will follow-up for routine testing of Vitamin D, at least 2-3 times per year to avoid over-replacement.  - Vitamin D, Ergocalciferol, (DRISDOL) 1.25 MG (50000 UNIT) CAPS capsule; Take 1 capsule (50,000 Units total) by mouth every 7 (seven) days.  Dispense: 12 capsule; Refill:  0  2. Obesity with current BMI of 32.9 Lisa Owens is currently in the action stage of change. As such, her goal is to continue with weight loss efforts. She has agreed to change to keeping a food journal and adhering to recommended goals of [phone] calories and 70+ grams of protein daily.   Risks and benefits of taking 2+ months off was discussed with the patient. Lisa Owens was advised of no refills until her next office visit, and she was made aware of the 6 month rule of our clinic's policy.  Exercise goals: All adults should avoid inactivity. Some physical activity is better than none, and adults who participate in any amount of physical activity gain some health benefits.  Behavioral modification strategies: holiday eating strategies , avoiding temptations, planning for success, and keeping a strict food journal.  Lisa Owens has requested to follow up in the second week of January 2023. She was informed of the importance of frequent follow-up visits to maximize her success with intensive lifestyle modifications for her multiple health conditions.   Objective:   Blood pressure 121/79, pulse 73, temperature 98 F (36.7 C), height 5\' 3"  (1.6 m), weight 186 lb (84.4 kg), SpO2 97 %. Body mass index is 32.95 kg/m.  General: Cooperative, alert, well developed, in no acute distress. HEENT: Conjunctivae and lids unremarkable. Cardiovascular: Regular rhythm.  Lungs: Normal work of breathing. Neurologic: No focal deficits.   Lab Results  Component Value Date   CREATININE 0.7 02/16/2020   BUN 20  02/16/2020   NA 142 02/16/2020   K 4.2 02/16/2020   CL 104 02/16/2020   CO2 29 (A) 02/16/2020   Lab Results  Component Value Date   ALT 19 02/16/2020   AST 21 02/16/2020   ALKPHOS 53 02/16/2020   BILITOT 0.5 12/07/2019   Lab Results  Component Value Date   HGBA1C 5.6 12/20/2020   HGBA1C 5.6 07/31/2020   HGBA1C 5.8 (H) 03/28/2020   HGBA1C 5.9 (H) 12/07/2019   Lab Results  Component  Value Date   INSULIN 9.0 12/20/2020   INSULIN 4.9 07/31/2020   INSULIN 8.1 03/28/2020   INSULIN 11.3 12/07/2019   Lab Results  Component Value Date   TSH 2.850 12/07/2019   Lab Results  Component Value Date   CHOL 260 (H) 12/20/2020   HDL 59 12/20/2020   LDLCALC 191 (H) 12/20/2020   LDLDIRECT 182 (H) 12/20/2020   TRIG 65 12/20/2020   CHOLHDL 4.4 12/20/2020   Lab Results  Component Value Date   VD25OH 55.7 12/20/2020   VD25OH 52.9 07/31/2020   VD25OH 47.0 03/28/2020   Lab Results  Component Value Date   WBC 7.8 02/16/2020   HGB 14.4 02/16/2020   HCT 42 02/16/2020   MCV 87 12/07/2019   PLT 231 02/16/2020   Lab Results  Component Value Date   IRON 70 09/24/2019   TIBC 361 09/24/2019   FERRITIN 25.1 09/24/2019    Obesity Behavioral Intervention:   Approximately 15 minutes were spent on the discussion below.  ASK: We discussed the diagnosis of obesity with Lisa Owens today and Lisa Owens agreed to give [name] permission to discuss obesity behavioral modification therapy today.  ASSESS: Lisa Owens has the diagnosis of obesity and her BMI today is 32.9. Lisa Owens is in the action stage of change.   ADVISE: Lisa Owens was educated on the multiple health risks of obesity as well as the benefit of weight loss to improve her health. She was advised of the need for long term treatment and the importance of lifestyle modifications to improve her current health and to decrease her risk of future health problems.  AGREE: Multiple dietary modification options and treatment options were discussed and Lisa Owens agreed to follow the recommendations documented in the above note.  ARRANGE: Anaily was educated on the importance of frequent visits to treat obesity as outlined per CMS and USPSTF guidelines and agreed to schedule her next follow up appointment today.  Attestation Statements:   Reviewed by clinician on day of visit: allergies, medications, problem list, medical history,  surgical history, family history, social history, and previous encounter notes.   Wilhemena Durie, am acting as transcriptionist for [employer], DO.  I have reviewed the above documentation for accuracy and completeness, and I agree with the above. Lisa Owens, D.O.  The [place] was signed into law in 2016 which includes the topic of electronic health records.  This provides immediate access to information in MyChart.  This includes consultation notes, operative notes, office notes, lab results and pathology reports.  If you have any questions about what you read please let us know at your next visit so we can discuss your concerns and take corrective action if need be.  We are right here with you.

## 2021-04-09 ENCOUNTER — Encounter (INDEPENDENT_AMBULATORY_CARE_PROVIDER_SITE_OTHER): Payer: Self-pay

## 2021-05-23 ENCOUNTER — Ambulatory Visit (INDEPENDENT_AMBULATORY_CARE_PROVIDER_SITE_OTHER): Payer: Medicare HMO | Admitting: Family Medicine

## 2021-06-07 ENCOUNTER — Ambulatory Visit (INDEPENDENT_AMBULATORY_CARE_PROVIDER_SITE_OTHER): Payer: Medicare HMO | Admitting: Family Medicine

## 2021-07-05 ENCOUNTER — Encounter (INDEPENDENT_AMBULATORY_CARE_PROVIDER_SITE_OTHER): Payer: Self-pay | Admitting: Family Medicine

## 2021-07-05 ENCOUNTER — Other Ambulatory Visit: Payer: Self-pay

## 2021-07-05 ENCOUNTER — Ambulatory Visit (INDEPENDENT_AMBULATORY_CARE_PROVIDER_SITE_OTHER): Payer: Medicare HMO | Admitting: Family Medicine

## 2021-07-05 VITALS — BP 121/84 | HR 88 | Temp 98.2°F | Ht 63.0 in | Wt 195.0 lb

## 2021-07-05 DIAGNOSIS — Z6832 Body mass index (BMI) 32.0-32.9, adult: Secondary | ICD-10-CM

## 2021-07-05 DIAGNOSIS — E538 Deficiency of other specified B group vitamins: Secondary | ICD-10-CM

## 2021-07-05 DIAGNOSIS — E7849 Other hyperlipidemia: Secondary | ICD-10-CM

## 2021-07-05 DIAGNOSIS — E669 Obesity, unspecified: Secondary | ICD-10-CM | POA: Diagnosis not present

## 2021-07-05 DIAGNOSIS — E559 Vitamin D deficiency, unspecified: Secondary | ICD-10-CM

## 2021-07-05 DIAGNOSIS — R7303 Prediabetes: Secondary | ICD-10-CM

## 2021-07-05 DIAGNOSIS — I10 Essential (primary) hypertension: Secondary | ICD-10-CM | POA: Diagnosis not present

## 2021-07-05 DIAGNOSIS — Z6834 Body mass index (BMI) 34.0-34.9, adult: Secondary | ICD-10-CM

## 2021-07-05 MED ORDER — VITAMIN D (ERGOCALCIFEROL) 1.25 MG (50000 UNIT) PO CAPS: 50000.0000 [IU] | ORAL_CAPSULE | ORAL | 0 refills | Status: DC

## 2021-07-09 NOTE — Progress Notes (Signed)
Chief Complaint:   OBESITY Lisa Owens is here to discuss her progress with her obesity treatment plan along with follow-up of her obesity related diagnoses. Lisa Owens is on keeping a food journal and adhering to recommended goals of [phone] calories and 70+ grams protein and states she is following her eating plan approximately 0% of the time. Lisa Owens states she is not currently exercising.  Today's visit was #: 40 Starting weight: 218 lbs Starting date: 12/07/2019 Today's weight: 195 lbs Today's date: 07/05/2021 Total lbs lost to date: 23 Total lbs lost since last in-office visit: +9  Interim History: Pt's last OV was 03/21/2021. Pt went away and then was sick for a while. She wanted to take a break and now regrets it a bit. She would like to come in every 2-3 weeks in the future.    Subjective:   1. Essential hypertension Asymptomatic without concerns. Pt is stable and medication compliance is good. Medication: Norvasc  2. Pre-diabetes Pt reports sweet cravings but pt admits eating more than usual lately. She denies need for medication to help.   3. Other hyperlipidemia Pt has always declined meds even though she has met criteria to be on a statin for many years.   4. B12 deficiency Asymptomatic. Lisa Owens is tolerating medication(s) well without side effects.  Medication compliance is good and patient appears to be taking it as prescribed.  The patient denies additional concerns regarding this condition. Medication: OTC B12 500 mcg QD  5. Vitamin D deficiency She is currently taking prescription vitamin D 50,000 IU each week. She denies nausea, vomiting or muscle weakness.    Assessment/Plan:   Orders Placed This Encounter  Procedures   Comprehensive metabolic panel   Hemoglobin A1c   Insulin, random   Lipid panel   VITAMIN D 25 Hydroxy (Vit-D Deficiency, Fractures)   Vitamin B12    Medications Discontinued During This Encounter  Medication Reason   Vitamin  D, Ergocalciferol, (DRISDOL) 1.25 MG (50000 UNIT) CAPS capsule Reorder     Meds ordered this encounter  Medications   Vitamin D, Ergocalciferol, (DRISDOL) 1.25 MG (50000 UNIT) CAPS capsule    Sig: Take 1 capsule (50,000 Units total) by mouth every 7 (seven) days.    Dispense:  4 capsule    Refill:  0    Ov for rf     1. Essential hypertension BP at goal. Lisa Owens is working on healthy weight loss and exercise to improve blood pressure control. We will watch for signs of hypotension as she continues her lifestyle modifications. Check labs at next OV.  2. Pre-diabetes Pt given handouts on Metformin and pre-diabetes after discussion of ways to gain better control of symptoms. Repeat labs at next OV.  3. Other hyperlipidemia Cardiovascular risk and specific lipid/LDL goals reviewed.  We discussed several lifestyle modifications today and Lisa Owens will continue to work on diet, exercise and weight loss efforts. Orders and follow up as documented in patient record. Repeat labs at next OV.  Counseling Intensive lifestyle modifications are the first line treatment for this issue. Dietary changes: Increase soluble fiber. Decrease simple carbohydrates. Exercise changes: Moderate to vigorous-intensity aerobic activity 150 minutes per week if tolerated. Lipid-lowering medications: see documented in medical record.   4. B12 deficiency The diagnosis was reviewed with the patient. Counseling provided today, see below. We will continue to monitor. Orders and follow up as documented in patient record. Repeat labs at next OV.  Counseling The body needs vitamin B12: to make red  blood cells; to make DNA; and to help the nerves work properly so they can carry messages from the brain to the body.  The main causes of vitamin B12 deficiency include dietary deficiency, digestive diseases, pernicious anemia, and having a surgery in which part of the stomach or small intestine is removed.  Certain medicines  can make it harder for the body to absorb vitamin B12. These medicines include: heartburn medications; some antibiotics; some medications used to treat diabetes, gout, and high cholesterol.  In some cases, there are no symptoms of this condition. If the condition leads to anemia or nerve damage, various symptoms can occur, such as weakness or fatigue, shortness of breath, and numbness or tingling in your hands and feet.   Treatment:  May include taking vitamin B12 supplements.  Avoid alcohol.  Eat lots of healthy foods that contain vitamin B12: Beef, pork, chicken, [place], and organ meats, such as liver.  Seafood: This includes clams, rainbow trout, salmon, tuna, and haddock. Eggs.  Cereal and dairy products that are fortified: This means that vitamin B12 has been added to the food.   5. Vitamin D deficiency Low Vitamin D level contributes to fatigue and are associated with obesity, breast, and colon cancer. She agrees to continue to take prescription Vitamin D 50,000 IU every week and will follow-up for routine testing of Vitamin D, at least 2-3 times per year to avoid over-replacement. Repeat labs at next OV.  Refill- Vitamin D, Ergocalciferol, (DRISDOL) 1.25 MG (50000 UNIT) CAPS capsule; Take 1 capsule (50,000 Units total) by mouth every 7 (seven) days.  Dispense: 4 capsule; Refill: 0  6. Obesity with current BMI of 34.6 Lisa Owens is currently in the action stage of change. As such, her goal is to continue with weight loss efforts. She has agreed to Change to [employer]) practicing portion control and making smarter food choices, such as increasing vegetables and decreasing simple carbohydrates.   Meal plan changes discussed with pt and reviewed various options with pt.  Pt feels [facility] [place] is best for her.  Exercise goals: All adults should avoid inactivity. Some physical activity is better than none, and adults who participate in any amount of physical activity gain some health  benefits.  Behavioral modification strategies: keeping healthy foods in the home, better snacking choices, and avoiding temptations.  Lisa Owens has agreed to follow-up with our clinic in 2-3 weeks, fasting for CMP, A1C, FI, FLP, Vit D, B12.  Pt still declines repeat IC.    She was informed of the importance of frequent follow-up visits to maximize her success with intensive lifestyle modifications for her multiple health conditions.    Objective:   Blood pressure 121/84, pulse 88, temperature 98.2 F (36.8 C), height 5\' 3"  (1.6 m), weight 195 lb (88.5 kg), SpO2 97 %. Body mass index is 34.54 kg/m.  General: Cooperative, alert, well developed, in no acute distress. HEENT: Conjunctivae and lids unremarkable. Cardiovascular: Regular rhythm.  Lungs: Normal work of breathing. Neurologic: No focal deficits.   Lab Results  Component Value Date   CREATININE 0.7 02/16/2020   BUN 20 02/16/2020   NA 142 02/16/2020   K 4.2 02/16/2020   CL 104 02/16/2020   CO2 29 (A) 02/16/2020   Lab Results  Component Value Date   ALT 19 02/16/2020   AST 21 02/16/2020   ALKPHOS 53 02/16/2020   BILITOT 0.5 12/07/2019   Lab Results  Component Value Date   HGBA1C 5.6 12/20/2020   HGBA1C 5.6 07/31/2020  HGBA1C 5.8 (H) 03/28/2020   HGBA1C 5.9 (H) 12/07/2019   Lab Results  Component Value Date   INSULIN 9.0 12/20/2020   INSULIN 4.9 07/31/2020   INSULIN 8.1 03/28/2020   INSULIN 11.3 12/07/2019   Lab Results  Component Value Date   TSH 2.850 12/07/2019   Lab Results  Component Value Date   CHOL 260 (H) 12/20/2020   HDL 59 12/20/2020   LDLCALC 191 (H) 12/20/2020   LDLDIRECT 182 (H) 12/20/2020   TRIG 65 12/20/2020   CHOLHDL 4.4 12/20/2020   Lab Results  Component Value Date   VD25OH 55.7 12/20/2020   VD25OH 52.9 07/31/2020   VD25OH 47.0 03/28/2020   Lab Results  Component Value Date   WBC 7.8 02/16/2020   HGB 14.4 02/16/2020   HCT 42 02/16/2020   MCV 87 12/07/2019   PLT 231  02/16/2020   Lab Results  Component Value Date   IRON 70 09/24/2019   TIBC 361 09/24/2019   FERRITIN 25.1 09/24/2019    Obesity Behavioral Intervention:   Approximately 15 minutes were spent on the discussion below.  ASK: We discussed the diagnosis of obesity with Lisa Owens today and Lisa Owens agreed to give [name] permission to discuss obesity behavioral modification therapy today.  ASSESS: Tawona has the diagnosis of obesity and her BMI today is 34.6. Anthea is in the action stage of change.   ADVISE: Inita was educated on the multiple health risks of obesity as well as the benefit of weight loss to improve her health. She was advised of the need for long term treatment and the importance of lifestyle modifications to improve her current health and to decrease her risk of future health problems.  AGREE: Multiple dietary modification options and treatment options were discussed and Nadege agreed to follow the recommendations documented in the above note.  ARRANGE: Brianah was educated on the importance of frequent visits to treat obesity as outlined per CMS and USPSTF guidelines and agreed to schedule her next follow up appointment today.  Attestation Statements:   Reviewed by clinician on day of visit: allergies, medications, problem list, medical history, surgical history, family history, social history, and previous encounter notes.  Coral Ceo, CMA, am acting as transcriptionist for [employer], DO.  I have reviewed the above documentation for accuracy and completeness, and I agree with the above.[name], D.O.  The [place] was signed into law in 2016 which includes the topic of electronic health records.  This provides immediate access to information in MyChart.  This includes consultation notes, operative notes, office notes, lab results and pathology reports.  If you have any questions about what you read please let us know at  your next visit so we can discuss your concerns and take corrective action if need be.  We are right here with you.

## 2021-07-30 ENCOUNTER — Other Ambulatory Visit (INDEPENDENT_AMBULATORY_CARE_PROVIDER_SITE_OTHER): Payer: Self-pay | Admitting: Family Medicine

## 2021-07-30 ENCOUNTER — Encounter (INDEPENDENT_AMBULATORY_CARE_PROVIDER_SITE_OTHER): Payer: Self-pay | Admitting: Family Medicine

## 2021-07-30 ENCOUNTER — Other Ambulatory Visit: Payer: Self-pay

## 2021-07-30 ENCOUNTER — Ambulatory Visit (INDEPENDENT_AMBULATORY_CARE_PROVIDER_SITE_OTHER): Payer: Medicare HMO | Admitting: Family Medicine

## 2021-07-30 VITALS — BP 133/84 | HR 77 | Temp 98.2°F | Ht 63.0 in | Wt 192.0 lb

## 2021-07-30 DIAGNOSIS — I10 Essential (primary) hypertension: Secondary | ICD-10-CM

## 2021-07-30 DIAGNOSIS — E559 Vitamin D deficiency, unspecified: Secondary | ICD-10-CM

## 2021-07-30 DIAGNOSIS — E538 Deficiency of other specified B group vitamins: Secondary | ICD-10-CM | POA: Diagnosis not present

## 2021-07-30 DIAGNOSIS — Z6834 Body mass index (BMI) 34.0-34.9, adult: Secondary | ICD-10-CM

## 2021-07-30 DIAGNOSIS — E7849 Other hyperlipidemia: Secondary | ICD-10-CM | POA: Diagnosis not present

## 2021-07-30 DIAGNOSIS — R7303 Prediabetes: Secondary | ICD-10-CM

## 2021-07-30 DIAGNOSIS — E669 Obesity, unspecified: Secondary | ICD-10-CM

## 2021-07-30 MED ORDER — VITAMIN D (ERGOCALCIFEROL) 1.25 MG (50000 UNIT) PO CAPS: 50000.0000 [IU] | ORAL_CAPSULE | ORAL | 0 refills | Status: DC

## 2021-07-31 LAB — CBC WITH DIFFERENTIAL/PLATELET
Basophils Absolute: 0 10*3/uL (ref 0.0–0.2)
Basos: 1 %
EOS (ABSOLUTE): 0.1 10*3/uL (ref 0.0–0.4)
Eos: 1 %
Hematocrit: 40.7 % (ref 34.0–46.6)
Hemoglobin: 13.7 g/dL (ref 11.1–15.9)
Immature Grans (Abs): 0 10*3/uL (ref 0.0–0.1)
Immature Granulocytes: 0 %
Lymphocytes Absolute: 1.8 10*3/uL (ref 0.7–3.1)
Lymphs: 22 %
MCH: 27.6 pg (ref 26.6–33.0)
MCHC: 33.7 g/dL (ref 31.5–35.7)
MCV: 82 fL (ref 79–97)
Monocytes Absolute: 0.7 10*3/uL (ref 0.1–0.9)
Monocytes: 9 %
Neutrophils Absolute: 5.4 10*3/uL (ref 1.4–7.0)
Neutrophils: 67 %
Platelets: 277 10*3/uL (ref 150–450)
RBC: 4.96 x10E6/uL (ref 3.77–5.28)
RDW: 15.2 % (ref 11.7–15.4)
WBC: 8.1 10*3/uL (ref 3.4–10.8)

## 2021-07-31 LAB — COMPREHENSIVE METABOLIC PANEL
ALT: 8 IU/L (ref 0–32)
AST: 14 IU/L (ref 0–40)
Albumin/Globulin Ratio: 1.4 (ref 1.2–2.2)
Albumin: 4.2 g/dL (ref 3.6–4.6)
Alkaline Phosphatase: 57 IU/L (ref 44–121)
BUN/Creatinine Ratio: 23 (ref 12–28)
BUN: 18 mg/dL (ref 8–27)
Bilirubin Total: 0.6 mg/dL (ref 0.0–1.2)
CO2: 24 mmol/L (ref 20–29)
Calcium: 10 mg/dL (ref 8.7–10.3)
Chloride: 105 mmol/L (ref 96–106)
Creatinine, Ser: 0.8 mg/dL (ref 0.57–1.00)
Globulin, Total: 3.1 g/dL (ref 1.5–4.5)
Glucose: 93 mg/dL (ref 70–99)
Potassium: 4.9 mmol/L (ref 3.5–5.2)
Sodium: 144 mmol/L (ref 134–144)
Total Protein: 7.3 g/dL (ref 6.0–8.5)
eGFR: 74 mL/min/{1.73_m2} (ref >59)

## 2021-07-31 LAB — HEMOGLOBIN A1C
Est. average glucose Bld gHb Est-mCnc: 111 mg/dL
Hgb A1c MFr Bld: 5.5 % (ref 4.8–5.6)

## 2021-07-31 LAB — INSULIN, RANDOM: INSULIN: 7.4 u[IU]/mL (ref 2.6–24.9)

## 2021-07-31 LAB — VITAMIN B12: Vitamin B-12: 1163 pg/mL (ref 232–1245)

## 2021-07-31 LAB — VITAMIN D 25 HYDROXY (VIT D DEFICIENCY, FRACTURES): Vit D, 25-Hydroxy: 59.6 ng/mL (ref 30.0–100.0)

## 2021-08-02 NOTE — Progress Notes (Signed)
? ? ? ?Chief Complaint:  ? ?OBESITY ?Myrle is here to discuss her progress with her obesity treatment plan along with follow-up of her obesity related diagnoses. Joselynne is on practicing portion control and making smarter food choices, such as increasing vegetables and decreasing simple carbohydrates and states she is following her eating plan approximately 90% of the time. Kamya states she is not exercising regularly at this time. ? ?Today's visit was #: 42 ?Starting weight: 218 lbs ?Starting date: 12/07/2019 ?Today's weight: 192 lbs ?Today's date: 07/30/2021 ?Total lbs lost to date: 26 lbs ?Total lbs lost since last in-office visit: 3 lbs ? ?Interim History: Kimia has been following [facility].  She says she likes it a lot.   ? ?Subjective:  ? ?1. Essential hypertension ?Jeanett Schlein is taking Norvasc 5 mg daily. ? ?2. Prediabetes ?Niobe is not on any medications for prediabetes. ? ?3. B12 deficiency ?She is taking OTC vitamin B12 500 mcg daily. ? ?4. Other hyperlipidemia ?Ajah declines going on cholesterol medication. ? ?5. Vitamin D deficiency ?Last vitamin D level was 55.7 (7 months ago).  She is currently taking prescription vitamin D 50,000 IU each week. She denies nausea, vomiting or muscle weakness. ? ?Assessment/Plan:  ? ?Orders Placed This Encounter  ?Procedures  ? CBC with Differential/Platelet  ? Comprehensive metabolic panel  ? Vitamin B12  ? VITAMIN D 25 Hydroxy (Vit-D Deficiency, Fractures)  ? Hemoglobin A1c  ? Insulin, random  ? ? ?Medications Discontinued During This Encounter  ?Medication Reason  ? Vitamin D, Ergocalciferol, (DRISDOL) 1.25 MG (50000 UNIT) CAPS capsule Reorder  ?  ? ?Meds ordered this encounter  ?Medications  ? Vitamin D, Ergocalciferol, (DRISDOL) 1.25 MG (50000 UNIT) CAPS capsule  ?  Sig: Take 1 capsule (50,000 Units total) by mouth every 7 (seven) days.  ?  Dispense:  4 capsule  ?  Refill:  0  ?  Ov for rf  ?  ? ?1. Essential hypertension ?At goal.  Will check CMP  and CBC today. ? ?- CBC with Differential/Platelet ?- Comprehensive metabolic panel ? ?2. Prediabetes ?Will check fasting insulin and A1c today. ? ?- Hemoglobin A1c ?- Insulin, random ? ?3. B12 deficiency ?Will check vitamin B12 level today. ? ?- Vitamin B12 ? ?4. Other hyperlipidemia ?No need to check lipid panel today due to patient not wanting to go on medication, so we would not change her treatment plan.  Continue prudent nutritional plan and increase exercise. ? ?5. Vitamin D deficiency ?Low Vitamin D level contributes to fatigue and are associated with obesity, breast, and colon cancer. She agrees to continue to take prescription Vitamin D '@50'$ ,000 IU every week and we will check her vitamin D level today. ? ?- Refill Vitamin D, Ergocalciferol, (DRISDOL) 1.25 MG (50000 UNIT) CAPS capsule; Take 1 capsule (50,000 Units total) by mouth every 7 (seven) days.  Dispense: 4 capsule; Refill: 0 ?- VITAMIN D 25 Hydroxy (Vit-D Deficiency, Fractures) ? ?6. Obesity with current BMI of 34.1 ? ?Tyreonna is currently in the action stage of change. As such, her goal is to continue with weight loss efforts. She has agreed to practicing portion control and making smarter food choices, such as increasing vegetables and decreasing simple carbohydrates.  ? ?Try [place] pasta. Healthy snacking options reviewed. ? ?Exercise goals:  As is. ? ?Behavioral modification strategies: better snacking choices, avoiding temptations, and planning for success. ? ?Jessice has agreed to follow-up with our clinic in 3 weeks to go over lab results. She was informed of  the importance of frequent follow-up visits to maximize her success with intensive lifestyle modifications for her multiple health conditions.  ? ?Tanyia was informed we would discuss her lab results at her next visit unless there is a critical issue that needs to be addressed sooner. Quina agreed to keep her next visit at the agreed upon time to discuss these  results. ? ?Objective:  ? ?Blood pressure 133/84, pulse 77, temperature 98.2 ?F (36.8 ?C), height '5\' 3"'$  (1.6 m), weight 192 lb (87.1 kg), SpO2 97 %. ?Body mass index is 34.01 kg/m?. ? ?General: Cooperative, alert, well developed, in no acute distress. ?HEENT: Conjunctivae and lids unremarkable. ?Cardiovascular: Regular rhythm.  ?Lungs: Normal work of breathing. ?Neurologic: No focal deficits.  ? ?Lab Results  ?Component Value Date  ? CREATININE 0.80 07/30/2021  ? BUN 18 07/30/2021  ? NA 144 07/30/2021  ? K 4.9 07/30/2021  ? CL 105 07/30/2021  ? CO2 24 07/30/2021  ? ?Lab Results  ?Component Value Date  ? ALT 8 07/30/2021  ? AST 14 07/30/2021  ? ALKPHOS 57 07/30/2021  ? BILITOT 0.6 07/30/2021  ? ?Lab Results  ?Component Value Date  ? HGBA1C 5.5 07/30/2021  ? HGBA1C 5.6 12/20/2020  ? HGBA1C 5.6 07/31/2020  ? HGBA1C 5.8 (H) 03/28/2020  ? HGBA1C 5.9 (H) 12/07/2019  ? ?Lab Results  ?Component Value Date  ? INSULIN 7.4 07/30/2021  ? INSULIN 9.0 12/20/2020  ? INSULIN 4.9 07/31/2020  ? INSULIN 8.1 03/28/2020  ? INSULIN 11.3 12/07/2019  ? ?Lab Results  ?Component Value Date  ? TSH 2.850 12/07/2019  ? ?Lab Results  ?Component Value Date  ? CHOL 260 (H) 12/20/2020  ? HDL 59 12/20/2020  ? LDLCALC 191 (H) 12/20/2020  ? LDLDIRECT 182 (H) 12/20/2020  ? TRIG 65 12/20/2020  ? CHOLHDL 4.4 12/20/2020  ? ?Lab Results  ?Component Value Date  ? VD25OH 59.6 07/30/2021  ? VD25OH 55.7 12/20/2020  ? VD25OH 52.9 07/31/2020  ? ?Lab Results  ?Component Value Date  ? WBC 8.1 07/30/2021  ? HGB 13.7 07/30/2021  ? HCT 40.7 07/30/2021  ? MCV 82 07/30/2021  ? PLT 277 07/30/2021  ? ?Lab Results  ?Component Value Date  ? IRON 70 09/24/2019  ? TIBC 361 09/24/2019  ? FERRITIN 25.1 09/24/2019  ? ?Obesity Behavioral Intervention:  ? ?Approximately 15 minutes were spent on the discussion below. ? ?ASK: ?We discussed the diagnosis of obesity with [name] today and Traniyah agreed to give [name] permission to discuss obesity behavioral modification therapy  today. ? ?ASSESS: ?Vada has the diagnosis of obesity and her BMI today is 34.1. Dannell is in the action stage of change.  ? ?ADVISE: ?Reginald was educated on the multiple health risks of obesity as well as the benefit of weight loss to improve her health. She was advised of the need for long term treatment and the importance of lifestyle modifications to improve her current health and to decrease her risk of future health problems. ? ?AGREE: ?Multiple dietary modification options and treatment options were discussed and Brandye agreed to follow the recommendations documented in the above note. ? ?ARRANGE: ?Hawley was educated on the importance of frequent visits to treat obesity as outlined per CMS and USPSTF guidelines and agreed to schedule her next follow up appointment today. ? ?Attestation Statements:  ? ?Reviewed by clinician on day of visit: allergies, medications, problem list, medical history, surgical history, family history, social history, and previous encounter notes. ? ?I, Water quality scientist, CMA, am acting as  transcriptionist for Mellody Dance, DO. ? ?I have reviewed the above documentation for accuracy and completeness, and I agree with the above. [name], D.O. ? ?The [place] was signed into law in 2016 which includes the topic of electronic health records.  This provides immediate access to information in MyChart.  This includes consultation notes, operative notes, office notes, lab results and pathology reports.  If you have any questions about what you read please let us know at your next visit so we can discuss your concerns and take corrective action if need be.  We are right here with you. ? ?

## 2021-08-21 ENCOUNTER — Encounter (INDEPENDENT_AMBULATORY_CARE_PROVIDER_SITE_OTHER): Payer: Self-pay

## 2021-08-21 ENCOUNTER — Encounter (INDEPENDENT_AMBULATORY_CARE_PROVIDER_SITE_OTHER): Payer: Self-pay | Admitting: Family Medicine

## 2021-08-21 ENCOUNTER — Ambulatory Visit (INDEPENDENT_AMBULATORY_CARE_PROVIDER_SITE_OTHER): Payer: Medicare HMO | Admitting: Family Medicine

## 2021-08-21 DIAGNOSIS — M25559 Pain in unspecified hip: Secondary | ICD-10-CM | POA: Diagnosis not present

## 2021-08-21 DIAGNOSIS — M1990 Unspecified osteoarthritis, unspecified site: Secondary | ICD-10-CM | POA: Diagnosis not present

## 2021-08-21 DIAGNOSIS — M47816 Spondylosis without myelopathy or radiculopathy, lumbar region: Secondary | ICD-10-CM | POA: Diagnosis not present

## 2021-08-21 DIAGNOSIS — M25551 Pain in right hip: Secondary | ICD-10-CM | POA: Diagnosis not present

## 2021-08-21 DIAGNOSIS — E559 Vitamin D deficiency, unspecified: Secondary | ICD-10-CM

## 2021-08-29 DIAGNOSIS — Z03818 Encounter for observation for suspected exposure to other biological agents ruled out: Secondary | ICD-10-CM | POA: Diagnosis not present

## 2021-08-29 DIAGNOSIS — R059 Cough, unspecified: Secondary | ICD-10-CM | POA: Diagnosis not present

## 2021-09-24 ENCOUNTER — Ambulatory Visit (INDEPENDENT_AMBULATORY_CARE_PROVIDER_SITE_OTHER): Payer: Medicare HMO | Admitting: Family Medicine

## 2021-10-16 ENCOUNTER — Ambulatory Visit (INDEPENDENT_AMBULATORY_CARE_PROVIDER_SITE_OTHER): Payer: Medicare HMO | Admitting: Family Medicine

## 2021-10-16 ENCOUNTER — Encounter (INDEPENDENT_AMBULATORY_CARE_PROVIDER_SITE_OTHER): Payer: Self-pay | Admitting: Family Medicine

## 2021-10-16 VITALS — BP 115/74 | HR 77 | Temp 98.0°F | Ht 63.0 in | Wt 200.0 lb

## 2021-10-16 DIAGNOSIS — E559 Vitamin D deficiency, unspecified: Secondary | ICD-10-CM

## 2021-10-16 DIAGNOSIS — E538 Deficiency of other specified B group vitamins: Secondary | ICD-10-CM

## 2021-10-16 DIAGNOSIS — E669 Obesity, unspecified: Secondary | ICD-10-CM | POA: Diagnosis not present

## 2021-10-16 DIAGNOSIS — Z6835 Body mass index (BMI) 35.0-35.9, adult: Secondary | ICD-10-CM

## 2021-10-16 MED ORDER — VITAMIN D (ERGOCALCIFEROL) 1.25 MG (50000 UNIT) PO CAPS: 50000.0000 [IU] | ORAL_CAPSULE | ORAL | 0 refills | Status: DC

## 2021-10-22 NOTE — Progress Notes (Signed)
Chief Complaint:   OBESITY Lisa Owens is here to discuss her progress with her obesity treatment plan along with follow-up of her obesity related diagnoses. Lisa Owens is on practicing portion control and making smarter food choices, such as increasing vegetables and decreasing simple carbohydrates and states she is following her eating plan approximately 0% of the time. Lisa Owens states she is not exercising.  Today's visit was #: 28 Starting weight: 218 lbs Starting date: 12/07/2019 Today's weight: 192 lbs Today's date: 10/16/2021 Total lbs lost to date: 26 lbs Total lbs lost since last in-office visit: 0  Interim History: Lisa Owens last office visit was 07/30/2021.  She had one week of steroids and ate a ton.  She was being treated for pneumonia for 1-2 weeks.  She still has no energy and still does not feel back to normal.  She is not meal prepping.  Lisa Owens likes her portion control, smarter food choices. limit to 100 calories of "snack calories" per day.  Lisa Owens refuses to repeat IC to recheck RMR.   Subjective:   1. Vitamin D deficiency Lisa Owens has been off Vitamin D for one month or so.  Last check of Vitamin D was about 2 months ago, her level was 59.6.  2. B12 deficiency Lisa Owens is on Vitamin B12 500 mcg daily OTC.  Her energy has been variable due to recent illnesses.    Assessment/Plan:  No orders of the defined types were placed in this encounter.   Medications Discontinued During This Encounter  Medication Reason   Vitamin D, Ergocalciferol, (DRISDOL) 1.25 MG (50000 UNIT) CAPS capsule Reorder     Meds ordered this encounter  Medications   Vitamin D, Ergocalciferol, (DRISDOL) 1.25 MG (50000 UNIT) CAPS capsule    Sig: Take 1 capsule (50,000 Units total) by mouth every 7 (seven) days.    Dispense:  4 capsule    Refill:  0    Ov for rf     1. Vitamin D deficiency Lisa Owens was reminded of every 2 year bone density exam.  Refill Ergocalciferol once  weekly, see below.  - Vitamin D, Ergocalciferol, (DRISDOL) 1.25 MG (50000 UNIT) CAPS capsule; Take 1 capsule (50,000 Units total) by mouth every 7 (seven) days.  Dispense: 4 capsule; Refill: 0  2. B12 deficiency Continue Vitamin B12 and increase activity.   3. Obesity with current BMI of 35.6 Lisa Owens is currently in the action stage of change. As such, her goal is to continue with weight loss efforts. She has agreed to practicing portion control and making smarter food choices, such as increasing vegetables and decreasing simple carbohydrates.   Exercise goals: All adults should avoid inactivity. Some physical activity is better than none, and adults who participate in any amount of physical activity gain some health benefits.  Behavioral modification strategies: increasing lean protein intake, decreasing simple carbohydrates, no skipping meals, and planning for success.  Lisa Owens has agreed to follow-up with our clinic in 3-5 weeks. She was informed of the importance of frequent follow-up visits to maximize her success with intensive lifestyle modifications for her multiple health conditions.   Objective:   Blood pressure 115/74, pulse 77, temperature 98 F (36.7 C), height '5\' 3"'$  (1.6 m), weight 200 lb (90.7 kg), SpO2 96 %. Body mass index is 35.43 kg/m.  General: Cooperative, alert, well developed, in no acute distress. HEENT: Conjunctivae and lids unremarkable. Cardiovascular: Regular rhythm.  Lungs: Normal work of breathing. Neurologic: No focal deficits.   Lab Results  Component Value Date  CREATININE 0.80 07/30/2021   BUN 18 07/30/2021   NA 144 07/30/2021   K 4.9 07/30/2021   CL 105 07/30/2021   CO2 24 07/30/2021   Lab Results  Component Value Date   ALT 8 07/30/2021   AST 14 07/30/2021   ALKPHOS 57 07/30/2021   BILITOT 0.6 07/30/2021   Lab Results  Component Value Date   HGBA1C 5.5 07/30/2021   HGBA1C 5.6 12/20/2020   HGBA1C 5.6 07/31/2020   HGBA1C 5.8 (H)  03/28/2020   HGBA1C 5.9 (H) 12/07/2019   Lab Results  Component Value Date   INSULIN 7.4 07/30/2021   INSULIN 9.0 12/20/2020   INSULIN 4.9 07/31/2020   INSULIN 8.1 03/28/2020   INSULIN 11.3 12/07/2019   Lab Results  Component Value Date   TSH 2.850 12/07/2019   Lab Results  Component Value Date   CHOL 260 (H) 12/20/2020   HDL 59 12/20/2020   LDLCALC 191 (H) 12/20/2020   LDLDIRECT 182 (H) 12/20/2020   TRIG 65 12/20/2020   CHOLHDL 4.4 12/20/2020   Lab Results  Component Value Date   VD25OH 59.6 07/30/2021   VD25OH 55.7 12/20/2020   VD25OH 52.9 07/31/2020   Lab Results  Component Value Date   WBC 8.1 07/30/2021   HGB 13.7 07/30/2021   HCT 40.7 07/30/2021   MCV 82 07/30/2021   PLT 277 07/30/2021   Lab Results  Component Value Date   IRON 70 09/24/2019   TIBC 361 09/24/2019   FERRITIN 25.1 09/24/2019    Obesity Behavioral Intervention:   Approximately 15 minutes were spent on the discussion below.  ASK: We discussed the diagnosis of obesity with Lisa Owens today and Lisa Owens agreed to give [name] permission to discuss obesity behavioral modification therapy today.  ASSESS: Lisa Owens has the diagnosis of obesity and her BMI today is 35.6. Lisa Owens is in the action stage of change.   ADVISE: Lisa Owens was educated on the multiple health risks of obesity as well as the benefit of weight loss to improve her health. She was advised of the need for long term treatment and the importance of lifestyle modifications to improve her current health and to decrease her risk of future health problems.  AGREE: Multiple dietary modification options and treatment options were discussed and Lisa Owens agreed to follow the recommendations documented in the above note.  ARRANGE: Lisa Owens was educated on the importance of frequent visits to treat obesity as outlined per CMS and USPSTF guidelines and agreed to schedule her next follow up appointment today.  Attestation Statements:    Reviewed by clinician on day of visit: allergies, medications, problem list, medical history, surgical history, family history, social history, and previous encounter notes.  I, Davy Pique, RMA, am acting as Location manager for [employer], DO.  I have reviewed the above documentation for accuracy and completeness, and I agree with the above. Lisa Owens, D.O.  The [place] was signed into law in 2016 which includes the topic of electronic health records.  This provides immediate access to information in MyChart.  This includes consultation notes, operative notes, office notes, lab results and pathology reports.  If you have any questions about what you read please let us know at your next visit so we can discuss your concerns and take corrective action if need be.  We are right here with you.

## 2021-11-06 ENCOUNTER — Encounter (INDEPENDENT_AMBULATORY_CARE_PROVIDER_SITE_OTHER): Payer: Self-pay | Admitting: Family Medicine

## 2021-11-06 ENCOUNTER — Ambulatory Visit (INDEPENDENT_AMBULATORY_CARE_PROVIDER_SITE_OTHER): Payer: Medicare HMO | Admitting: Family Medicine

## 2021-11-06 VITALS — BP 101/67 | HR 91 | Temp 97.9°F | Ht 63.0 in | Wt 200.0 lb

## 2021-11-06 DIAGNOSIS — E669 Obesity, unspecified: Secondary | ICD-10-CM | POA: Diagnosis not present

## 2021-11-06 DIAGNOSIS — Z6835 Body mass index (BMI) 35.0-35.9, adult: Secondary | ICD-10-CM

## 2021-11-06 DIAGNOSIS — E559 Vitamin D deficiency, unspecified: Secondary | ICD-10-CM | POA: Diagnosis not present

## 2021-11-06 MED ORDER — VITAMIN D (ERGOCALCIFEROL) 1.25 MG (50000 UNIT) PO CAPS: 50000.0000 [IU] | ORAL_CAPSULE | ORAL | 0 refills | Status: AC

## 2021-11-07 NOTE — Progress Notes (Unsigned)
Chief Complaint:   OBESITY Lisa Owens is here to discuss her progress with her obesity treatment plan along with follow-up of her obesity related diagnoses. Lisa Owens is on practicing portion control and making smarter food choices, such as increasing vegetables and decreasing simple carbohydrates and states she is following her eating plan approximately 85% of the time. Lisa Owens states she is not exercising.  Today's visit was #: 52 Starting weight: 218 lbs Starting date: 12/07/2019 Today's weight: 200 lbs Today's date: 11/06/2021 Total lbs lost to date: 18 lbs Total lbs lost since last in-office visit: 0  Interim History: Lisa Owens is disappointed that she did not lose weight this time.  She voices that she only ate sweets one time and went out for pasta one time.   Subjective:   1. Vitamin D deficiency She is currently taking prescription vitamin D 50,000 IU each week. She denies nausea, vomiting or muscle weakness.    Assessment/Plan:  No orders of the defined types were placed in this encounter.   Medications Discontinued During This Encounter  Medication Reason   Vitamin D, Ergocalciferol, (DRISDOL) 1.25 MG (50000 UNIT) CAPS capsule Reorder     Meds ordered this encounter  Medications   Vitamin D, Ergocalciferol, (DRISDOL) 1.25 MG (50000 UNIT) CAPS capsule    Sig: Take 1 capsule (50,000 Units total) by mouth every 7 (seven) days.    Dispense:  4 capsule    Refill:  0    Ov for rf     1. Vitamin D deficiency Low Vitamin D level contributes to fatigue and are associated with obesity, breast, and colon cancer. She agrees to continue to take prescription Vitamin D '@50'$ ,000 IU every week and will follow-up for routine testing of Vitamin D, at least 2-3 times per year to avoid over-replacement.  Refill - Vitamin D, Ergocalciferol, (DRISDOL) 1.25 MG (50000 UNIT) CAPS capsule; Take 1 capsule (50,000 Units total) by mouth every 7 (seven) days.  Dispense: 4 capsule;  Refill: 0  2. Obesity with current BMI of 35.4 Lisa Owens is currently in the action stage of change. As such, her goal is to continue with weight loss efforts. She has agreed to practicing portion control and making smarter food choices, such as increasing vegetables and decreasing simple carbohydrates.   Exercise goals: All adults should avoid inactivity. Some physical activity is better than none, and adults who participate in any amount of physical activity gain some health benefits.  Behavioral modification strategies: increasing lean protein intake, decreasing simple carbohydrates, increasing water intake, and planning for success.  Lisa Owens has agreed to follow-up with our clinic in 4 weeks. She was informed of the importance of frequent follow-up visits to maximize her success with intensive lifestyle modifications for her multiple health conditions.   Objective:   Blood pressure 101/67, pulse 91, temperature 97.9 F (36.6 C), height '5\' 3"'$  (1.6 m), weight 200 lb (90.7 kg), SpO2 94 %. Body mass index is 35.43 kg/m.  General: Cooperative, alert, well developed, in no acute distress. HEENT: Conjunctivae and lids unremarkable. Cardiovascular: Regular rhythm.  Lungs: Normal work of breathing. Neurologic: No focal deficits.   Lab Results  Component Value Date   CREATININE 0.80 07/30/2021   BUN 18 07/30/2021   NA 144 07/30/2021   K 4.9 07/30/2021   CL 105 07/30/2021   CO2 24 07/30/2021   Lab Results  Component Value Date   ALT 8 07/30/2021   AST 14 07/30/2021   ALKPHOS 57 07/30/2021   BILITOT 0.6  07/30/2021   Lab Results  Component Value Date   HGBA1C 5.5 07/30/2021   HGBA1C 5.6 12/20/2020   HGBA1C 5.6 07/31/2020   HGBA1C 5.8 (H) 03/28/2020   HGBA1C 5.9 (H) 12/07/2019   Lab Results  Component Value Date   INSULIN 7.4 07/30/2021   INSULIN 9.0 12/20/2020   INSULIN 4.9 07/31/2020   INSULIN 8.1 03/28/2020   INSULIN 11.3 12/07/2019   Lab Results  Component Value  Date   TSH 2.850 12/07/2019   Lab Results  Component Value Date   CHOL 260 (H) 12/20/2020   HDL 59 12/20/2020   LDLCALC 191 (H) 12/20/2020   LDLDIRECT 182 (H) 12/20/2020   TRIG 65 12/20/2020   CHOLHDL 4.4 12/20/2020   Lab Results  Component Value Date   VD25OH 59.6 07/30/2021   VD25OH 55.7 12/20/2020   VD25OH 52.9 07/31/2020   Lab Results  Component Value Date   WBC 8.1 07/30/2021   HGB 13.7 07/30/2021   HCT 40.7 07/30/2021   MCV 82 07/30/2021   PLT 277 07/30/2021   Lab Results  Component Value Date   IRON 70 09/24/2019   TIBC 361 09/24/2019   FERRITIN 25.1 09/24/2019    Obesity Behavioral Intervention:   Approximately 15 minutes were spent on the discussion below.  ASK: We discussed the diagnosis of obesity with [name] today and Daley agreed to give [name] permission to discuss obesity behavioral modification therapy today.  ASSESS: Brightyn has the diagnosis of obesity and her BMI today is 35.4. Lillis is in the action stage of change.   ADVISE: Marshell was educated on the multiple health risks of obesity as well as the benefit of weight loss to improve her health. She was advised of the need for long term treatment and the importance of lifestyle modifications to improve her current health and to decrease her risk of future health problems.  AGREE: Multiple dietary modification options and treatment options were discussed and Natalye agreed to follow the recommendations documented in the above note.  ARRANGE: Briannie was educated on the importance of frequent visits to treat obesity as outlined per CMS and USPSTF guidelines and agreed to schedule her next follow up appointment today.  Attestation Statements:   Reviewed by clinician on day of visit: allergies, medications, problem list, medical history, surgical history, family history, social history, and previous encounter notes.  I, Davy Pique, RMA, am acting as Location manager for [employer], DO.  I have reviewed the above documentation for accuracy and completeness, and I agree with the above. [name], D.O.  The [place] was signed into law in 2016 which includes the topic of electronic health records.  This provides immediate access to information in MyChart.  This includes consultation notes, operative notes, office notes, lab results and pathology reports.  If you have any questions about what you read please let us know at your next visit so we can discuss your concerns and take corrective action if need be.  We are right here with you.

## 2021-11-13 DIAGNOSIS — D3130 Benign neoplasm of unspecified choroid: Secondary | ICD-10-CM | POA: Diagnosis not present

## 2021-11-28 DIAGNOSIS — R040 Epistaxis: Secondary | ICD-10-CM | POA: Diagnosis not present

## 2021-12-11 ENCOUNTER — Ambulatory Visit (INDEPENDENT_AMBULATORY_CARE_PROVIDER_SITE_OTHER): Payer: Medicare HMO | Admitting: Family Medicine

## 2021-12-12 ENCOUNTER — Emergency Department (HOSPITAL_COMMUNITY)
Admission: EM | Admit: 2021-12-12 | Discharge: 2021-12-12 | Disposition: A | Discharge status: Home / Self Care | Payer: Medicare HMO | Attending: Emergency Medicine | Admitting: Emergency Medicine

## 2021-12-12 ENCOUNTER — Other Ambulatory Visit: Payer: Self-pay

## 2021-12-12 ENCOUNTER — Encounter (HOSPITAL_COMMUNITY): Payer: Self-pay

## 2021-12-12 DIAGNOSIS — R040 Epistaxis: Secondary | ICD-10-CM | POA: Insufficient documentation

## 2021-12-12 DIAGNOSIS — Z7901 Long term (current) use of anticoagulants: Secondary | ICD-10-CM | POA: Diagnosis not present

## 2021-12-12 LAB — BASIC METABOLIC PANEL
Anion gap: 6 (ref 5–15)
BUN: 31 mg/dL — ABNORMAL HIGH (ref 8–23)
CO2: 24 mmol/L (ref 22–32)
Calcium: 9.9 mg/dL (ref 8.9–10.3)
Chloride: 108 mmol/L (ref 98–111)
Creatinine, Ser: 0.71 mg/dL (ref 0.44–1.00)
GFR, Estimated: >60 mL/min (ref >60)
Glucose, Bld: 97 mg/dL (ref 70–99)
Potassium: 4.6 mmol/L (ref 3.5–5.1)
Sodium: 138 mmol/L (ref 135–145)

## 2021-12-12 LAB — CBC WITH DIFFERENTIAL/PLATELET
Abs Immature Granulocytes: 0.03 10*3/uL (ref 0.00–0.07)
Basophils Absolute: 0.1 10*3/uL (ref 0.0–0.1)
Basophils Relative: 1 %
Eosinophils Absolute: 0.1 10*3/uL (ref 0.0–0.5)
Eosinophils Relative: 1 %
HCT: 38.7 % (ref 36.0–46.0)
Hemoglobin: 12.5 g/dL (ref 12.0–15.0)
Immature Granulocytes: 0 %
Lymphocytes Relative: 17 %
Lymphs Abs: 1.8 10*3/uL (ref 0.7–4.0)
MCH: 27.7 pg (ref 26.0–34.0)
MCHC: 32.3 g/dL (ref 30.0–36.0)
MCV: 85.8 fL (ref 80.0–100.0)
Monocytes Absolute: 0.9 10*3/uL (ref 0.1–1.0)
Monocytes Relative: 9 %
Neutro Abs: 7.4 10*3/uL (ref 1.7–7.7)
Neutrophils Relative %: 72 %
Platelets: 276 10*3/uL (ref 150–400)
RBC: 4.51 MIL/uL (ref 3.87–5.11)
RDW: 15.2 % (ref 11.5–15.5)
WBC: 10.3 10*3/uL (ref 4.0–10.5)
nRBC: 0 % (ref 0.0–0.2)

## 2021-12-12 MED ORDER — OXYMETAZOLINE HCL 0.05 % NA SOLN
1.0000 | Freq: Once | NASAL | Status: AC
  Administered 2021-12-12: 1 via NASAL
  Filled 2021-12-12: qty 30

## 2021-12-12 NOTE — ED Triage Notes (Signed)
Pt reports nose bleed that started about 30 mins ago. Pt is on eliquis. Reports large amount of blood loss.

## 2021-12-12 NOTE — ED Provider Notes (Signed)
[facility] DEPT Provider Note   CSN: [id number] Arrival date & time: 12/12/21  1636     History  Chief Complaint  Patient presents with   Epistaxis    Lisa Owens is a 81 y.o. female.  Patient with onset of right-sided nosebleed about 30 minutes prior to arrival.  Patient is on Eliquis long-term for history of frequent clots.  Patient back around July 19 was seen by [facility] physicians for small nosebleed.  This 1 has been bigger.  But is predominantly been out of the right side of the nares but there has been some out the left.  Patient denies any trauma or injury to the nose.  Patient denies any upper respiratory symptoms.  Past medical history significant for deep vein thrombosis diverticulitis.  Past surgical history significant for cholecystectomy patient never smoked.       Home Medications Prior to Admission medications   Medication Sig Start Date End Date Taking? Authorizing Provider  amLODipine (NORVASC) 5 MG tablet  02/28/20   [provider]  apixaban (ELIQUIS) 2.5 MG TABS tablet Take 1 tablet (2.5 mg total) by mouth 2 (two) times daily. 06/12/19   Rai, Vernelle Emerald, MD  Cyanocobalamin (VITAMIN B-12) 500 MCG TABS Take 500 mcg by mouth daily. Dec intake from 5,072mg qd to 5043m qd 01/10/21   Opalski, Deborah, DO  polyethylene glycol (MIRALAX / GLYCOLAX) 17 g packet Take 17 g by mouth 2 (two) times daily. Until stooling regularly 02/02/20   OpMellody DanceDO  Vitamin D, Ergocalciferol, (DRISDOL) 1.25 MG (50000 UNIT) CAPS capsule Take 1 capsule (50,000 Units total) by mouth every 7 (seven) days. 11/06/21   OpMellody DanceDO      Allergies    Patient has no known allergies.    Review of Systems   Review of Systems  Constitutional:  Negative for chills and fever.  HENT:  Positive for nosebleeds. Negative for ear pain and sore throat.   Eyes:  Negative for pain and visual disturbance.  Respiratory:  Negative for cough and  shortness of breath.   Cardiovascular:  Negative for chest pain and palpitations.  Gastrointestinal:  Negative for abdominal pain and vomiting.  Genitourinary:  Negative for dysuria and hematuria.  Musculoskeletal:  Negative for arthralgias and back pain.  Skin:  Negative for color change and rash.  Neurological:  Negative for seizures and syncope.  Hematological:  Bruises/bleeds easily.  All other systems reviewed and are negative.   Physical Exam Updated Vital Signs BP 133/68   Pulse 69   Temp 98.1 F (36.7 C) (Oral)   Resp 18   SpO2 96%  Physical Exam Vitals and nursing note reviewed.  Constitutional:      General: She is not in acute distress.    Appearance: Normal appearance. She is well-developed.  HENT:     Head: Normocephalic and atraumatic.     Nose:     Comments: Large amount of clot in the right nares.  Small clot left nares.  Had patient blow this out.  Appears to be an anterior bleed from the right nares.  But not able to identify a specific source    Mouth/Throat:     Mouth: Mucous membranes are moist.     Comments: No blood draining down the back of the pharynx Eyes:     Extraocular Movements: Extraocular movements intact.     Conjunctiva/sclera: Conjunctivae normal.     Pupils: Pupils are equal, round, and reactive to light.  Cardiovascular:     Rate and Rhythm: Normal rate and regular rhythm.     Heart sounds: No murmur heard. Pulmonary:     Effort: Pulmonary effort is normal. No respiratory distress.     Breath sounds: Normal breath sounds.  Abdominal:     Palpations: Abdomen is soft.     Tenderness: There is no abdominal tenderness.  Musculoskeletal:        General: No swelling.     Cervical back: Normal range of motion and neck supple.  Skin:    General: Skin is warm and dry.     Capillary Refill: Capillary refill takes less than 2 seconds.  Neurological:     General: No focal deficit present.     Mental Status: She is alert and oriented to  person, place, and time.     Cranial Nerves: No cranial nerve deficit.     Sensory: No sensory deficit.     Motor: No weakness.  Psychiatric:        Mood and Affect: Mood normal.     ED Results / Procedures / Treatments   Labs (all labs ordered are listed, but only abnormal results are displayed) Labs Reviewed  BASIC METABOLIC PANEL - Abnormal; Notable for the following components:      Result Value   BUN 31 (*)    All other components within normal limits  CBC WITH DIFFERENTIAL/PLATELET    EKG None  Radiology No results found.  Procedures .Epistaxis Management  Date/Time: 12/12/2021 7:29 PM  Performed by: Fredia Sorrow, MD Authorized by: Fredia Sorrow, MD   Consent:    Consent obtained:  Verbal   Consent given by:  Patient   Risks, benefits, and alternatives were discussed: yes     Risks discussed:  Bleeding   Alternatives discussed:  No treatment Universal protocol:    Procedure explained and questions answered to patient or proxy's satisfaction: yes     Relevant documents present and verified: yes     Test results available: yes     Imaging studies available: no     Required blood products, implants, devices, and special equipment available: yes     Site/side marked: no     Immediately prior to procedure, a time out was called: yes     Patient identity confirmed:  Verbally with patient Anesthesia:    Anesthesia method:  None Procedure details:    Treatment site:  R anterior   Treatment complexity:  Limited   Treatment episode: initial   Post-procedure details:    Assessment:  Bleeding stopped Comments:     Just sprayed Afrin in the nose and then had her apply nose pinching for 30 minutes     Medications Ordered in ED Medications  oxymetazoline (AFRIN) 0.05 % nasal spray 1 spray (1 spray Each Nare Given 12/12/21 1712)    ED Course/ Medical Decision Making/ A&P                           Medical Decision Making Amount and/or Complexity of Data  Reviewed Labs: ordered.   Patient after having her blowing out clots on both sides large clot on the right nares.  Then sprayed Afrin in both nares.  Had her pinch her nose for 30 minutes all bleeding stopped.  Patient's CBC hemoglobin significant for being normal at 12.5.  Platelets are normal at 276.  Basic metabolic panel without any significant abnormalities.  Renal function was normal.  Patient was referred to ear nose and throat by her [facility] primary care doctor in July.  However she did not make an appointment.  But she has them that she can recall.  Patient will be discharged home with instructions and her Afrin.   Final Clinical Impression(s) / ED Diagnoses Final diagnoses:  Right-sided epistaxis    Rx / DC Orders ED Discharge Orders     None         Fredia Sorrow, MD 12/12/21 1931

## 2021-12-12 NOTE — Discharge Instructions (Addendum)
Follow the instructions we went over if the nose starts to rebleed.  No your nose to clear out the clots pinch and hold for 20 minutes.  If that does not stop it then blow your nose again spray the Afrin and pinch and hold for another 20 minutes so that does not stop the need to get seen again.  Recontact ear nose and throat for follow-up.  Be gentle with your nose it will easily start to rebleed.

## 2021-12-12 NOTE — ED Provider Triage Note (Signed)
Emergency Medicine Provider Triage Evaluation Note  Lisa Owens , a 81 y.o. female  was evaluated in triage.  Complaining of epistasis for the past 30 minutes.  She has a history of this.  Unable to get in with ENT.  Supposed to see her PCP in a week but she has had more frequent nosebleeds.  Can usually control them at home but today she has not been able to. Review of Systems  Positive: epistaxis Negative: Dizziness, palpitations, shortness of breath  Physical Exam  There were no vitals taken for this visit. Gen:   Awake, no distress   Resp:  Normal effort  MSK:   Moves extremities without difficulty  Other:  Bright red blood coming from right nare.  Clot noted.  No signs of blood or trauma in the left nare.  Medical Decision Making  Medically screening exam initiated at 4:43 PM.  Appropriate orders placed.  Lisa Owens was informed that the remainder of the evaluation will be completed by another provider, this initial triage assessment does not replace that evaluation, and the importance of remaining in the ED until their evaluation is complete.     Lisa Owens, [place] 12/12/21 1644

## 2021-12-19 ENCOUNTER — Encounter (INDEPENDENT_AMBULATORY_CARE_PROVIDER_SITE_OTHER): Payer: Self-pay

## 2021-12-19 DIAGNOSIS — R040 Epistaxis: Secondary | ICD-10-CM | POA: Diagnosis not present

## 2021-12-28 DIAGNOSIS — Z7901 Long term (current) use of anticoagulants: Secondary | ICD-10-CM | POA: Diagnosis not present

## 2021-12-28 DIAGNOSIS — R040 Epistaxis: Secondary | ICD-10-CM | POA: Diagnosis not present

## 2022-03-04 DIAGNOSIS — R0989 Other specified symptoms and signs involving the circulatory and respiratory systems: Secondary | ICD-10-CM | POA: Diagnosis not present

## 2022-03-04 DIAGNOSIS — R059 Cough, unspecified: Secondary | ICD-10-CM | POA: Diagnosis not present

## 2022-03-04 DIAGNOSIS — Z6835 Body mass index (BMI) 35.0-35.9, adult: Secondary | ICD-10-CM | POA: Diagnosis not present

## 2022-03-04 DIAGNOSIS — R0602 Shortness of breath: Secondary | ICD-10-CM | POA: Diagnosis not present

## 2022-04-09 DIAGNOSIS — R7303 Prediabetes: Secondary | ICD-10-CM | POA: Diagnosis not present

## 2022-04-09 DIAGNOSIS — Z86718 Personal history of other venous thrombosis and embolism: Secondary | ICD-10-CM | POA: Diagnosis not present

## 2022-04-09 DIAGNOSIS — E782 Mixed hyperlipidemia: Secondary | ICD-10-CM | POA: Diagnosis not present

## 2022-04-09 DIAGNOSIS — Z23 Encounter for immunization: Secondary | ICD-10-CM | POA: Diagnosis not present

## 2022-04-09 DIAGNOSIS — D6869 Other thrombophilia: Secondary | ICD-10-CM | POA: Diagnosis not present

## 2022-04-09 DIAGNOSIS — Z6836 Body mass index (BMI) 36.0-36.9, adult: Secondary | ICD-10-CM | POA: Diagnosis not present

## 2022-04-09 DIAGNOSIS — Z7901 Long term (current) use of anticoagulants: Secondary | ICD-10-CM | POA: Diagnosis not present

## 2022-04-09 DIAGNOSIS — I10 Essential (primary) hypertension: Secondary | ICD-10-CM | POA: Diagnosis not present

## 2022-04-09 DIAGNOSIS — Z0000 Encounter for general adult medical examination without abnormal findings: Secondary | ICD-10-CM | POA: Diagnosis not present

## 2022-04-10 DIAGNOSIS — D509 Iron deficiency anemia, unspecified: Secondary | ICD-10-CM | POA: Diagnosis not present

## 2022-04-10 DIAGNOSIS — M8580 Other specified disorders of bone density and structure, unspecified site: Secondary | ICD-10-CM | POA: Diagnosis not present

## 2022-04-10 DIAGNOSIS — E559 Vitamin D deficiency, unspecified: Secondary | ICD-10-CM | POA: Diagnosis not present

## 2022-04-10 DIAGNOSIS — I700 Atherosclerosis of aorta: Secondary | ICD-10-CM | POA: Diagnosis not present

## 2022-04-10 DIAGNOSIS — R7303 Prediabetes: Secondary | ICD-10-CM | POA: Diagnosis not present

## 2022-04-10 DIAGNOSIS — I10 Essential (primary) hypertension: Secondary | ICD-10-CM | POA: Diagnosis not present

## 2022-04-10 DIAGNOSIS — E782 Mixed hyperlipidemia: Secondary | ICD-10-CM | POA: Diagnosis not present

## 2022-07-10 DIAGNOSIS — M542 Cervicalgia: Secondary | ICD-10-CM | POA: Diagnosis not present

## 2022-07-10 DIAGNOSIS — I700 Atherosclerosis of aorta: Secondary | ICD-10-CM | POA: Diagnosis not present

## 2022-07-10 DIAGNOSIS — D6869 Other thrombophilia: Secondary | ICD-10-CM | POA: Diagnosis not present

## 2022-11-19 DIAGNOSIS — M5136 Other intervertebral disc degeneration, lumbar region: Secondary | ICD-10-CM | POA: Diagnosis not present

## 2022-12-23 DIAGNOSIS — M5136 Other intervertebral disc degeneration, lumbar region: Secondary | ICD-10-CM | POA: Diagnosis not present

## 2022-12-23 DIAGNOSIS — M4135 Thoracogenic scoliosis, thoracolumbar region: Secondary | ICD-10-CM | POA: Diagnosis not present

## 2022-12-23 DIAGNOSIS — M5416 Radiculopathy, lumbar region: Secondary | ICD-10-CM | POA: Diagnosis not present

## 2023-02-25 DIAGNOSIS — M5136 Other intervertebral disc degeneration, lumbar region with discogenic back pain only: Secondary | ICD-10-CM | POA: Diagnosis not present

## 2023-04-16 DIAGNOSIS — I10 Essential (primary) hypertension: Secondary | ICD-10-CM | POA: Diagnosis not present

## 2023-04-16 DIAGNOSIS — R7303 Prediabetes: Secondary | ICD-10-CM | POA: Diagnosis not present

## 2023-04-16 DIAGNOSIS — Z0000 Encounter for general adult medical examination without abnormal findings: Secondary | ICD-10-CM | POA: Diagnosis not present

## 2023-04-16 DIAGNOSIS — Z6837 Body mass index (BMI) 37.0-37.9, adult: Secondary | ICD-10-CM | POA: Diagnosis not present

## 2023-04-16 DIAGNOSIS — E782 Mixed hyperlipidemia: Secondary | ICD-10-CM | POA: Diagnosis not present

## 2023-04-16 DIAGNOSIS — Z131 Encounter for screening for diabetes mellitus: Secondary | ICD-10-CM | POA: Diagnosis not present

## 2023-04-16 DIAGNOSIS — E559 Vitamin D deficiency, unspecified: Secondary | ICD-10-CM | POA: Diagnosis not present

## 2024-04-06 DIAGNOSIS — J040 Acute laryngitis: Secondary | ICD-10-CM | POA: Diagnosis not present
